# Patient Record
Sex: Female | Born: 1945 | Race: White | Hispanic: No | Smoking: Former smoker
Health system: Southern US, Community
[De-identification: ages and names within clinical notes are randomized; demographics above are authoritative.]

## PROBLEM LIST (undated history)

## (undated) DIAGNOSIS — I10 Essential (primary) hypertension: Secondary | ICD-10-CM

## (undated) DIAGNOSIS — M1990 Unspecified osteoarthritis, unspecified site: Secondary | ICD-10-CM

## (undated) DIAGNOSIS — E119 Type 2 diabetes mellitus without complications: Secondary | ICD-10-CM

## (undated) DIAGNOSIS — J4520 Mild intermittent asthma, uncomplicated: Secondary | ICD-10-CM

## (undated) DIAGNOSIS — Z860100 Personal history of colon polyps, unspecified: Secondary | ICD-10-CM

## (undated) DIAGNOSIS — E785 Hyperlipidemia, unspecified: Secondary | ICD-10-CM

## (undated) DIAGNOSIS — I6521 Occlusion and stenosis of right carotid artery: Secondary | ICD-10-CM

## (undated) DIAGNOSIS — I340 Nonrheumatic mitral (valve) insufficiency: Secondary | ICD-10-CM

## (undated) DIAGNOSIS — E113299 Type 2 diabetes mellitus with mild nonproliferative diabetic retinopathy without macular edema, unspecified eye: Secondary | ICD-10-CM

## (undated) HISTORY — DX: Personal history of colon polyps, unspecified: Z86.0100

## (undated) HISTORY — DX: Hyperlipidemia, unspecified: E78.5

## (undated) HISTORY — DX: Mild intermittent asthma, uncomplicated: J45.20

## (undated) HISTORY — DX: Type 2 diabetes mellitus with mild nonproliferative diabetic retinopathy without macular edema, unspecified eye: E11.3299

## (undated) HISTORY — DX: Unspecified osteoarthritis, unspecified site: M19.90

## (undated) HISTORY — PX: CHOLECYSTECTOMY: SHX55

## (undated) HISTORY — DX: Essential (primary) hypertension: I10

## (undated) HISTORY — DX: Nonrheumatic mitral (valve) insufficiency: I34.0

## (undated) HISTORY — PX: LASIK: SHX215

## (undated) HISTORY — DX: Type 2 diabetes mellitus without complications: E11.9

## (undated) HISTORY — DX: Personal history of colonic polyps: Z86.010

---
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
## 1970-05-11 HISTORY — PX: ABDOMINAL HYSTERECTOMY: SHX81

## 2004-06-23 ENCOUNTER — Ambulatory Visit: Payer: Self-pay | Admitting: Internal Medicine

## 2005-06-29 ENCOUNTER — Ambulatory Visit: Payer: Self-pay | Admitting: Internal Medicine

## 2005-07-23 ENCOUNTER — Ambulatory Visit: Payer: Self-pay | Admitting: Internal Medicine

## 2005-12-31 ENCOUNTER — Ambulatory Visit: Payer: Self-pay | Admitting: Internal Medicine

## 2006-05-18 ENCOUNTER — Encounter: Payer: Self-pay | Admitting: Internal Medicine

## 2007-06-03 ENCOUNTER — Emergency Department: Payer: Self-pay | Admitting: Emergency Medicine

## 2007-12-28 ENCOUNTER — Ambulatory Visit: Payer: Self-pay | Admitting: Internal Medicine

## 2007-12-28 DIAGNOSIS — I10 Essential (primary) hypertension: Secondary | ICD-10-CM | POA: Insufficient documentation

## 2007-12-28 DIAGNOSIS — M1990 Unspecified osteoarthritis, unspecified site: Secondary | ICD-10-CM | POA: Insufficient documentation

## 2007-12-28 DIAGNOSIS — J4520 Mild intermittent asthma, uncomplicated: Secondary | ICD-10-CM

## 2007-12-28 DIAGNOSIS — Z860100 Personal history of colon polyps, unspecified: Secondary | ICD-10-CM | POA: Insufficient documentation

## 2007-12-28 DIAGNOSIS — E785 Hyperlipidemia, unspecified: Secondary | ICD-10-CM | POA: Insufficient documentation

## 2007-12-28 DIAGNOSIS — J309 Allergic rhinitis, unspecified: Secondary | ICD-10-CM | POA: Insufficient documentation

## 2007-12-28 HISTORY — DX: Mild intermittent asthma, uncomplicated: J45.20

## 2007-12-30 LAB — CONVERTED CEMR LAB
Basophils Absolute: 0.1 10*3/uL (ref 0.0–0.1)
Basophils Relative: 0.6 % (ref 0.0–3.0)
CO2: 26 meq/L (ref 19–32)
Chloride: 103 meq/L (ref 96–112)
Creatinine,U: 111.6 mg/dL
GFR calc Af Amer: 110 mL/min
GFR calc non Af Amer: 91 mL/min
Lymphocytes Relative: 24.4 % (ref 12.0–46.0)
MCHC: 34.6 g/dL (ref 30.0–36.0)
Microalb Creat Ratio: 6.3 mg/g (ref 0.0–30.0)
Microalb, Ur: 0.7 mg/dL (ref 0.0–1.9)
Neutrophils Relative %: 68.4 % (ref 43.0–77.0)
Potassium: 4.4 meq/L (ref 3.5–5.1)
RBC: 5 M/uL (ref 3.87–5.11)
RDW: 12.8 % (ref 11.5–14.6)
Sodium: 138 meq/L (ref 135–145)

## 2008-01-10 ENCOUNTER — Ambulatory Visit: Payer: Self-pay | Admitting: Family Medicine

## 2008-01-10 ENCOUNTER — Telehealth (INDEPENDENT_AMBULATORY_CARE_PROVIDER_SITE_OTHER): Payer: Self-pay | Admitting: Internal Medicine

## 2008-01-12 ENCOUNTER — Telehealth (INDEPENDENT_AMBULATORY_CARE_PROVIDER_SITE_OTHER): Payer: Self-pay | Admitting: Internal Medicine

## 2008-01-13 ENCOUNTER — Telehealth (INDEPENDENT_AMBULATORY_CARE_PROVIDER_SITE_OTHER): Payer: Self-pay | Admitting: Internal Medicine

## 2008-02-29 ENCOUNTER — Ambulatory Visit: Payer: Self-pay | Admitting: Internal Medicine

## 2008-03-02 LAB — CONVERTED CEMR LAB
Alkaline Phosphatase: 81 units/L (ref 39–117)
BUN: 16 mg/dL (ref 6–23)
Bilirubin, Direct: 0.1 mg/dL (ref 0.0–0.3)
CO2: 31 meq/L (ref 19–32)
Chloride: 101 meq/L (ref 96–112)
Creatinine, Ser: 0.8 mg/dL (ref 0.4–1.2)
Direct LDL: 142.9 mg/dL
GFR calc non Af Amer: 78 mL/min
Potassium: 4.7 meq/L (ref 3.5–5.1)
Total CHOL/HDL Ratio: 6.6
VLDL: 28 mg/dL (ref 0–40)

## 2008-03-19 ENCOUNTER — Encounter (INDEPENDENT_AMBULATORY_CARE_PROVIDER_SITE_OTHER): Payer: Self-pay | Admitting: *Deleted

## 2008-04-10 ENCOUNTER — Telehealth: Payer: Self-pay | Admitting: Internal Medicine

## 2008-04-16 ENCOUNTER — Ambulatory Visit: Payer: Self-pay | Admitting: Internal Medicine

## 2008-04-17 LAB — CONVERTED CEMR LAB
ALT: 44 units/L — ABNORMAL HIGH (ref 0–35)
Albumin: 3.9 g/dL (ref 3.5–5.2)
Alkaline Phosphatase: 66 units/L (ref 39–117)
BUN: 20 mg/dL (ref 6–23)
Calcium: 9.4 mg/dL (ref 8.4–10.5)
Chloride: 102 meq/L (ref 96–112)
Cholesterol: 153 mg/dL (ref 0–200)
Creatinine, Ser: 0.8 mg/dL (ref 0.4–1.2)
GFR calc Af Amer: 94 mL/min
GFR calc non Af Amer: 78 mL/min
LDL Cholesterol: 96 mg/dL (ref 0–99)
Phosphorus: 3.8 mg/dL (ref 2.3–4.6)
Total Protein: 7.4 g/dL (ref 6.0–8.3)
Triglycerides: 99 mg/dL (ref 0–149)
VLDL: 20 mg/dL (ref 0–40)

## 2008-08-21 ENCOUNTER — Telehealth: Payer: Self-pay | Admitting: Internal Medicine

## 2008-08-29 ENCOUNTER — Ambulatory Visit: Payer: Self-pay | Admitting: Internal Medicine

## 2008-11-13 ENCOUNTER — Telehealth: Payer: Self-pay | Admitting: Internal Medicine

## 2009-01-07 ENCOUNTER — Ambulatory Visit: Payer: Self-pay | Admitting: Internal Medicine

## 2009-01-16 ENCOUNTER — Telehealth: Payer: Self-pay | Admitting: Internal Medicine

## 2009-01-25 ENCOUNTER — Ambulatory Visit: Payer: Self-pay | Admitting: Internal Medicine

## 2009-03-04 ENCOUNTER — Ambulatory Visit: Payer: Self-pay | Admitting: Internal Medicine

## 2009-03-06 LAB — CONVERTED CEMR LAB
Albumin: 4.2 g/dL (ref 3.5–5.2)
Basophils Relative: 0.3 % (ref 0.0–3.0)
CO2: 29 meq/L (ref 19–32)
Chloride: 101 meq/L (ref 96–112)
Cholesterol: 162 mg/dL (ref 0–200)
Creatinine,U: 113.6 mg/dL
Eosinophils Relative: 2 % (ref 0.0–5.0)
GFR calc non Af Amer: 67.48 mL/min (ref >60)
HCT: 45.9 % (ref 36.0–46.0)
HDL: 38.5 mg/dL — ABNORMAL LOW (ref >39.00)
LDL Cholesterol: 84 mg/dL (ref 0–99)
Lymphs Abs: 2.4 10*3/uL (ref 0.7–4.0)
MCV: 92.2 fL (ref 78.0–100.0)
Monocytes Absolute: 0.5 10*3/uL (ref 0.1–1.0)
Monocytes Relative: 5.4 % (ref 3.0–12.0)
Platelets: 271 10*3/uL (ref 150.0–400.0)
Potassium: 4.5 meq/L (ref 3.5–5.1)
RBC: 4.98 M/uL (ref 3.87–5.11)
Sodium: 140 meq/L (ref 135–145)
TSH: 1.19 microintl units/mL (ref 0.35–5.50)
Total CHOL/HDL Ratio: 4
Total Protein: 7.4 g/dL (ref 6.0–8.3)
Triglycerides: 197 mg/dL — ABNORMAL HIGH (ref 0.0–149.0)
VLDL: 39.4 mg/dL (ref 0.0–40.0)
WBC: 9.3 10*3/uL (ref 4.5–10.5)

## 2009-03-21 ENCOUNTER — Telehealth: Payer: Self-pay | Admitting: Internal Medicine

## 2009-05-22 ENCOUNTER — Telehealth: Payer: Self-pay | Admitting: Internal Medicine

## 2009-06-18 LAB — HM DIABETES EYE EXAM

## 2009-07-26 ENCOUNTER — Telehealth: Payer: Self-pay | Admitting: Internal Medicine

## 2009-08-14 ENCOUNTER — Telehealth (INDEPENDENT_AMBULATORY_CARE_PROVIDER_SITE_OTHER): Payer: Self-pay | Admitting: *Deleted

## 2009-09-02 ENCOUNTER — Ambulatory Visit: Payer: Self-pay | Admitting: Internal Medicine

## 2009-09-03 LAB — CONVERTED CEMR LAB
AST: 38 units/L — ABNORMAL HIGH (ref 0–37)
Albumin: 3.8 g/dL (ref 3.5–5.2)
Alkaline Phosphatase: 91 units/L (ref 39–117)
Bilirubin, Direct: 0.1 mg/dL (ref 0.0–0.3)
Hgb A1c MFr Bld: 10.6 % — ABNORMAL HIGH (ref 4.6–6.5)
Total Protein: 6.9 g/dL (ref 6.0–8.3)

## 2009-09-04 ENCOUNTER — Ambulatory Visit: Payer: Self-pay | Admitting: Internal Medicine

## 2009-09-04 ENCOUNTER — Encounter: Payer: Self-pay | Admitting: Internal Medicine

## 2009-09-05 ENCOUNTER — Encounter: Payer: Self-pay | Admitting: Internal Medicine

## 2009-09-05 LAB — HM MAMMOGRAPHY: HM Mammogram: NORMAL

## 2009-11-18 ENCOUNTER — Telehealth: Payer: Self-pay | Admitting: Internal Medicine

## 2010-01-27 ENCOUNTER — Telehealth: Payer: Self-pay | Admitting: Internal Medicine

## 2010-03-03 ENCOUNTER — Ambulatory Visit: Payer: Self-pay | Admitting: Internal Medicine

## 2010-03-03 LAB — HM DIABETES FOOT EXAM

## 2010-03-05 LAB — CONVERTED CEMR LAB
Alkaline Phosphatase: 78 units/L (ref 39–117)
BUN: 16 mg/dL (ref 6–23)
Basophils Absolute: 0.1 10*3/uL (ref 0.0–0.1)
Bilirubin, Direct: 0.1 mg/dL (ref 0.0–0.3)
Calcium: 10.1 mg/dL (ref 8.4–10.5)
Chloride: 99 meq/L (ref 96–112)
Creatinine, Ser: 0.7 mg/dL (ref 0.4–1.2)
Creatinine,U: 34.3 mg/dL
Eosinophils Absolute: 0.2 10*3/uL (ref 0.0–0.7)
HCT: 45.1 % (ref 36.0–46.0)
HDL: 38.2 mg/dL — ABNORMAL LOW (ref >39.00)
Hemoglobin: 15.6 g/dL — ABNORMAL HIGH (ref 12.0–15.0)
Lymphs Abs: 2.4 10*3/uL (ref 0.7–4.0)
MCHC: 34.7 g/dL (ref 30.0–36.0)
MCV: 91.2 fL (ref 78.0–100.0)
Monocytes Absolute: 0.5 10*3/uL (ref 0.1–1.0)
Monocytes Relative: 5.4 % (ref 3.0–12.0)
Neutro Abs: 6.2 10*3/uL (ref 1.4–7.7)
Platelets: 272 10*3/uL (ref 150.0–400.0)
Potassium: 4.9 meq/L (ref 3.5–5.1)
RDW: 13.1 % (ref 11.5–14.6)
Total Bilirubin: 0.4 mg/dL (ref 0.3–1.2)
VLDL: 44 mg/dL — ABNORMAL HIGH (ref 0.0–40.0)

## 2010-04-07 ENCOUNTER — Telehealth: Payer: Self-pay | Admitting: Internal Medicine

## 2010-04-11 ENCOUNTER — Ambulatory Visit: Payer: Self-pay | Admitting: Internal Medicine

## 2010-04-11 DIAGNOSIS — M549 Dorsalgia, unspecified: Secondary | ICD-10-CM | POA: Insufficient documentation

## 2010-04-11 DIAGNOSIS — N760 Acute vaginitis: Secondary | ICD-10-CM | POA: Insufficient documentation

## 2010-05-27 ENCOUNTER — Ambulatory Visit
Admission: RE | Admit: 2010-05-27 | Discharge: 2010-05-27 | Payer: Self-pay | Source: Home / Self Care | Attending: Internal Medicine | Admitting: Internal Medicine

## 2010-05-27 LAB — CONVERTED CEMR LAB: Whiff Test: NEGATIVE

## 2010-06-10 NOTE — Assessment & Plan Note (Signed)
Summary: FOLLOW UP / LFW   Vital Signs:  Patient profile:   65 year old female Weight:      211 pounds BMI:     35.24 Temp:     98.1 degrees F oral BP sitting:   148 / 80  (left arm) Cuff size:   large  Vitals Entered By: Mervin Hack CMA [facility]) (September 02, 2009 10:20 AM) CC: 6 month follow-up   History of Present Illness: doing okay Mammo order not taken care of properly Copy given to her Wants to set up colonoscopy   Checks sugars 1-2 times a day does adjust insulin sometimes Fasting generally 120-150 No sig hypoglycemic reactions No sores or pain in feet  Does note cramping in legs and hands not bothersome She relates it to a lot of time on the computer Foot was after sig gardening time  Mild allergy symptoms uses OTC Asthma reasonably controlled uses rescue inhaler rarely  Allergies: 1)  ! Codeine  Past History:  Past medical, surgical, family and social histories (including risk factors) reviewed for relevance to current acute and chronic problems.  Past Medical History: Reviewed history from 12/28/2007 and no changes required. Allergic rhinitis Asthma Colonic polyps, hx of Diabetes mellitus, type II Hyperlipidemia Hypertension Osteoarthritis  Past Surgical History: Reviewed history from 12/28/2007 and no changes required. Cholecystectomy Hysterectomy LASIK  Family History: Reviewed history from 12/28/2007 and no changes required. Diabetes in both parents and 4 of 5 sibs Dad died of liver problems, brittle diabetes, asthma @78  Mom died @50  DM complications CAD in brother, mat GF Sister with carotid disease No HTN Mat aunt had breast cancer  Social History: Reviewed history from 01/07/2009 and no changes required. 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 Tries to initiate after son is gone for the day weight is up 7#--not walking lately  Physical Exam  General:  alert and normal appearance.   Neck:  supple, no masses, no thyromegaly, no carotid bruits, and no cervical lymphadenopathy.   Lungs:  normal respiratory effort and normal breath sounds.   Heart:  normal rate, regular rhythm, no murmur, and no gallop.   Abdomen:  soft and non-tender.   Msk:  no joint tenderness and no joint swelling.   Pulses:  1+ in feet Extremities:  no edema Neurologic:  alert & oriented X3, strength normal in all extremities, and gait normal.   Skin:  no suspicious lesions and no ulcerations.   Psych:  normally interactive, good eye contact, not anxious appearing, and not depressed appearing.    Diabetes Management Exam:    Foot Exam (with socks and/or shoes not present):       Sensory-Pinprick/Light touch:          Left medial foot (L-4): normal          Left dorsal foot (L-5): normal          Left lateral foot (S-1): normal          Right medial foot (L-4): normal          Right dorsal foot (L-5): normal          Right lateral foot (S-1): normal       Inspection:  Left foot: abnormal             Comments: mild plantar callous          Right foot: abnormal             Comments: mild plantar callous       Nails:          Left foot: normal          Right foot: normal    Eye Exam:       Eye Exam done elsewhere          Date: 06/18/2009          Results: No retinopathy          Done by: [facility]    Impression & Recommendations:  Problem # 1:  DIABETES MELLITUS, TYPE II (ICD-250.00) Assessment Unchanged  seems to have reasonable control will recheck labs  Her updated medication list for this problem includes:    Novolin 70/30 Innolet 70-30 % Susp (Insulin isophane & regular) .Marland Kitchen... 20 - 25 units two times a day    Lisinopril 20 Mg Tabs (Lisinopril) .Marland Kitchen... Take 1 tablet by mouth once  a day    Metformin Hcl 500 Mg Xr24h-tab (Metformin hcl) .Marland Kitchen... 1 tablet twice a day by mouth    Novolin R 100 Unit/ml Inj Soln (Insulin regular human) .Marland Kitchen... 2-10 units as needed bid by sliding scale  Labs Reviewed: Creat: 0.9 (03/04/2009)     Last Eye Exam: No retinopathy (06/18/2009) Reviewed HgBA1c results: 7.9 (03/04/2009)  7.3 (08/29/2008)  Orders: TLB-A1C / Hgb A1C (Glycohemoglobin) (83036-A1C) Venipuncture (16109)  Problem # 2:  HYPERTENSION (ICD-401.9) Assessment: Unchanged reasonable control no changes now since no microalbuminuria  Her updated medication list for this problem includes:    Lisinopril 20 Mg Tabs (Lisinopril) .Marland Kitchen... Take 1 tablet by mouth once a day  BP today: 148/80 Prior BP: 130/80 (03/04/2009)  Labs Reviewed: K+: 4.5 (03/04/2009) Creat: : 0.9 (03/04/2009)   Chol: 162 (03/04/2009)   HDL: 38.50 (03/04/2009)   LDL: 84 (03/04/2009)   TG: 197.0 (03/04/2009)  Problem # 3:  HYPERLIPIDEMIA (ICD-272.4) Assessment: Unchanged  at goal will recheck LFTs since borderline transaminase last time  Her updated medication list for this problem includes:    Lovastatin 40 Mg Tabs (Lovastatin) .Marland Kitchen... 1 daily by mouth  Labs Reviewed: SGOT: 30 (03/04/2009)   SGPT: 43 (03/04/2009)   HDL:38.50 (03/04/2009), 37.0 (04/16/2008)  LDL:84 (03/04/2009), 96 (04/16/2008)  Chol:162 (03/04/2009), 153 (04/16/2008)  Trig:197.0 (03/04/2009), 99 (04/16/2008)  Orders: TLB-Hepatic/Liver Function Pnl (80076-HEPATIC)  Problem # 4:  ASTHMA (ICD-493.90) Assessment: Unchanged quiet rarely needs rescue inhaler  Her updated medication list for this problem includes:    Advair Diskus 250-50 Mcg/dose Misc (Fluticasone-salmeterol) .Marland Kitchen... 1 puff two times a day    Ventolin Hfa 108 (90 Base) Mcg/act Aers (Albuterol sulfate) .Marland Kitchen... 2 puffs four times daily as needed for asthma  Complete Medication List: 1)  Novolin 70/30 Innolet 70-30 % Susp (Insulin isophane & regular) .... 20 - 25 units two  times a day 2)  Advair Diskus 250-50 Mcg/dose Misc (Fluticasone-salmeterol) .Marland Kitchen.. 1 puff two times a day 3)  Ventolin Hfa 108 (90 Base) Mcg/act Aers (Albuterol sulfate) .... 2 puffs four times daily as needed for asthma 4)  Lisinopril 20 Mg Tabs (Lisinopril) .... Take 1 tablet by mouth once a day 5)  Alprazolam 0.5 Mg Tabs (Alprazolam) .... Take 1-2 tablet by mouth at bedtime as needed 6)  Metformin Hcl  500 Mg Xr24h-tab (Metformin hcl) .Marland Kitchen.. 1 tablet twice a day by mouth 7)  Novolin R 100 Unit/ml Inj Soln (Insulin regular human) .... 2-10 units as needed bid by sliding scale 8)  Lovastatin 40 Mg Tabs (Lovastatin) .Marland Kitchen.. 1 daily by mouth 9)  Accu-chek Aviva Strp (Glucose blood) .... Check  blood glucose two times a day 10)  Easy Comfort Insulin Syringe 30g X 5/16" 0.5 Ml Misc (Insulin syringe-needle u-100) .... Inject 20 - 25 units two times a day sliding scale  Other Orders: Gastroenterology Referral (GI)  Patient Instructions: 1)  Please schedule a follow-up appointment in 6 months for physical 2)  Schedule a colonoscopy/ sigmoidoscopy to help detect colon cancer.  Prescriptions: ALPRAZOLAM 0.5 MG  TABS (ALPRAZOLAM) Take 1-2 tablet by mouth at bedtime as needed  #60 x 1   Entered and Authorized by:   Cindee Salt MD   Signed by:   Cindee Salt MD on 09/02/2009   Method used:   Print then Give to Patient   RxID:   [id number] LOVASTATIN 40 MG TABS (LOVASTATIN) 1 DAILY BY MOUTH  #30 x 12   Entered by:   Mervin Hack CMA (AAMA)   Authorized by:   Cindee Salt MD   Signed by:   Mervin Hack CMA (AAMA) on 09/02/2009   Method used:   Print then Give to Patient   RxID:   [id number] METFORMIN HCL 500 MG XR24H-TAB (METFORMIN HCL) 1 TABLET TWICE A DAY BY MOUTH  #60 x 12   Entered by:   Mervin Hack CMA (AAMA)   Authorized by:   Cindee Salt MD   Signed by:   Mervin Hack CMA (AAMA) on 09/02/2009   Method used:   Print then Give to Patient   RxID:    [id number] NOVOLIN R 100 UNIT/ML INJ SOLN (INSULIN REGULAR HUMAN) 2-10 units as needed bid by sliding scale  #1 vial x 12   Entered by:   Mervin Hack CMA (AAMA)   Authorized by:   Cindee Salt MD   Signed by:   Mervin Hack CMA (AAMA) on 09/02/2009   Method used:   Print then Give to Patient   RxID:   [id number] LISINOPRIL 20 MG  TABS (LISINOPRIL) Take 1 tablet by mouth once a day  #30 x 12   Entered by:   Mervin Hack CMA (AAMA)   Authorized by:   Cindee Salt MD   Signed by:   Mervin Hack CMA (AAMA) on 09/02/2009   Method used:   Print then Give to Patient   RxID:   [id number] ADVAIR DISKUS 250-50 MCG/DOSE  MISC (FLUTICASONE-SALMETEROL) 1 puff two times a day  #1 x 12   Entered by:   Mervin Hack CMA (AAMA)   Authorized by:   Cindee Salt MD   Signed by:   Mervin Hack CMA (AAMA) on 09/02/2009   Method used:   Print then Give to Patient   RxID:   [id number] NOVOLIN 70/30 INNOLET 70-30 %  SUSP (INSULIN ISOPHANE & REGULAR) 20 - 25 units two times a day  #20.0 Millili x 12   Entered by:   Mervin Hack CMA (AAMA)   Authorized by:   Cindee Salt MD   Signed by:   Mervin Hack CMA (AAMA) on 09/02/2009   Method used:   Print then Give to Patient   RxID:   [id number]   Current Allergies (reviewed today): ! CODEINE

## 2010-06-10 NOTE — Assessment & Plan Note (Signed)
Summary: BACK PAIN/CLE   Vital Signs:  Patient profile:   65 year old female Height:      65 inches Weight:      219.75 pounds BMI:     36.70 Temp:     98.6 degrees F oral Pulse rate:   84 / minute Pulse rhythm:   regular BP sitting:   150 / 100  (left arm) Cuff size:   large  Vitals Entered By: Linde Gillis CMA [facility]) (April 11, 2010 8:03 AM) CC: back pain, arm pain   History of Present Illness: "someything is wrong with my back" Has pain in lateral left lumbar back tingling down to feet and up into arms at times Intermittent grabbing pain  pain in left neck as well Has felt leg weakness a couple of times started 11 days ago no known injury Has tried aspirin and ibuprofen as well as aspercreme--not effective Is affecting her sleep  Has vaginal infection for 1 week No recent antibiotic No significant discharge--slight yellowy Itchy and very raw No sexual contacts Miconazole topical (OTC) hasn't helped  Having hives benedryl helps but is sedating  Allergies: 1)  ! Codeine  Past History:  Past medical, surgical, family and social histories (including risk factors) reviewed for relevance to current acute and chronic problems.  Past Medical History: Reviewed history from 12/28/2007 and no changes required. Allergic rhinitis Asthma Colonic polyps, hx of Diabetes mellitus, type II Hyperlipidemia Hypertension Osteoarthritis  Past Surgical History: Reviewed history from 12/28/2007 and no changes required. Cholecystectomy Hysterectomy LASIK  Family History: Reviewed history from 12/28/2007 and no changes required. Diabetes in both parents and 4 of 5 sibs Dad died of liver problems, brittle diabetes, asthma @78  Mom died @50  DM complications CAD in brother, mat GF Sister with carotid disease No HTN Mat aunt had breast cancer  Social History: Reviewed history from 01/07/2009 and no changes required. 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   Abdomen:  soft and non-tender.   Msk:  Mild decreased internal rotation in left hip but not right Fairly normal back flexion No spine tenderness Mild lateral left lumbar tenderness--in muscle area SLR negative Neurologic:  strength normal in all extremities and gait normal.     Impression & Recommendations:  Problem # 1:  BACK PAIN (ICD-724.5) Assessment New  seems to be muscular May have component of left hip osteoarthritis no evidence of HNP  will continue NSAIDs flexeril for sleep PT referral if not better in a couple of weeks  Her updated medication list for this problem includes:    Cyclobenzaprine Hcl 10 Mg Tabs (Cyclobenzaprine hcl) .Marland Kitchen... 1/2 -1 tab at bedtime as needed for muscle spasm  Problem # 2:  VAGINITIS (ICD-616.10) Assessment: New  most likely bacterial vaginosis Will try metrogel vaginal needs exam if not resolved  Her updated medication list for this problem includes:    Metronidazole 0.75 % Gel (Metronidazole) .Marland Kitchen... 1 applicatorful in vaginal two times a day for 5 days  Complete Medication List: 1)  Novolin 70/30 Innolet 70-30 % Susp (Insulin isophane & regular) .... 35-40 units two times a day 2)  Advair Diskus 250-50 Mcg/dose Misc (Fluticasone-salmeterol) .Marland Kitchen.. 1 puff two times a day 3)  Ventolin Hfa 108 (90 Base) Mcg/act Aers (Albuterol sulfate) .... 2 puffs four times daily  as needed for asthma 4)  Lisinopril 20 Mg Tabs (Lisinopril) .... Take 1 tablet by mouth once a day 5)  Alprazolam 0.5 Mg Tabs (Alprazolam) .... Take 1-2 tablet by mouth at bedtime as needed 6)  Metformin Hcl 500 Mg Xr24h-tab (Metformin hcl) .... 2 tablets daily before morning meal 7)  Novolin R 100 Unit/ml Inj Soln (Insulin regular human) .... 2-10 units as needed before meals  by sliding scale 8)   Lovastatin 40 Mg Tabs (Lovastatin) .Marland Kitchen.. 1 daily by mouth 9)  Accu-chek Aviva Strp (Glucose blood) .... Check  blood glucose two times a day 10)  Easy Comfort Insulin Syringe 30g X 5/16" 0.5 Ml Misc (Insulin syringe-needle u-100) .... Inject 20 - 25 units two times a day sliding scale 11)  Cyclobenzaprine Hcl 10 Mg Tabs (Cyclobenzaprine hcl) .... 1/2 -1 tab at bedtime as needed for muscle spasm 12)  Metronidazole 0.75 % Gel (Metronidazole) .Marland Kitchen.. 1 applicatorful in vaginal two times a day for 5 days  Patient Instructions: 1)  Please call if back isn't better in 2 weeks 2)  Please try ibuprofen 400-600mg  three times a day for the back till better 3)  Please try cetirizine 10mg  daily and/or loratadine 10mg  1-2 daily for the hives 4)  Keep regular follow up Prescriptions: METRONIDAZOLE 0.75 % GEL (METRONIDAZOLE) 1 applicatorful in vaginal two times a day for 5 days  #5 days x 0   Entered and Authorized by:   Cindee Salt MD   Signed by:   Cindee Salt MD on 04/11/2010   Method used:   Print then Give to Patient   RxID:   [id number] CYCLOBENZAPRINE HCL 10 MG TABS (CYCLOBENZAPRINE HCL) 1/2 -1 tab at bedtime as needed for muscle spasm  #30 x 0   Entered and Authorized by:   Cindee Salt MD   Signed by:   Cindee Salt MD on 04/11/2010   Method used:   Print then Give to Patient   RxID:   [id number]    Orders Added: 1)  Est. Patient Level IV [10272]    Current Allergies (reviewed today): ! CODEINE

## 2010-06-10 NOTE — Progress Notes (Signed)
Summary: ALPRAZOLAM  Phone Note Refill Request Message from:  [facility] [phone] on January 27, 2010 10:04 AM  Refills Requested: Medication #1:  ALPRAZOLAM 0.5 MG  TABS Take 1-2 tablet by mouth at bedtime as needed   Last Refilled: 12/26/2009 E-Scribe Request    Method Requested: Telephone to Pharmacy Initial call taken by: Mervin Hack CMA [facility]),  January 27, 2010 10:04 AM  Follow-up for Phone Call        okay #60 x 1 Follow-up by: Cindee Salt MD,  January 27, 2010 1:47 PM  Additional Follow-up for Phone Call Additional follow up Details #1::        Rx called to pharmacy Additional Follow-up by: DeShannon Katrinka Blazing CMA [facility]ull),  January 27, 2010 2:47 PM    Prescriptions: ALPRAZOLAM 0.5 MG  TABS (ALPRAZOLAM) Take 1-2 tablet by mouth at bedtime as needed  #60 x 1   Entered by:   Mervin Hack CMA (AAMA)   Authorized by:   Cindee Salt MD   Signed by:   Mervin Hack CMA (AAMA) on 01/27/2010   Method used:   Telephoned to ...       [facility] [address] [phone]* (retail)       [address], [place]  [address]       Ph: [phone]       Fax: [phone]   RxID:   [id number]

## 2010-06-10 NOTE — Assessment & Plan Note (Signed)
Summary: CPX / LFW   Vital Signs:  Patient profile:   65 year old female Weight:      217 pounds Temp:     98.1 degrees F oral Pulse rate:   84 / minute Pulse rhythm:   regular BP sitting:   156 / 86  (left arm) Cuff size:   large  Vitals Entered By: Sydell Axon LPN (March 03, 2010 11:23 AM) CC: 30 minute checkup, has had a hysterectomy   History of Present Illness: Doing okay  Did have an accident at work about 3 weeks ago---hit trash can and bounced off door Having some left shoulder pain still  Has had PT Missed only 2 dayas of work, then went back  Did increase insulin unwilling to take more oral meds Scattered compliance with diet---does avoid fried food walks some  did have follow up with GI  Colonscopy deferred till insurance will cover she thinks all her polyps were just hyperplastic  Allergies: 1)  ! Codeine  Past History:  Past medical, surgical, family and social histories (including risk factors) reviewed for relevance to current acute and chronic problems.  Past Medical History: Reviewed history from 12/28/2007 and no changes required. Allergic rhinitis Asthma Colonic polyps, hx of Diabetes mellitus, type II Hyperlipidemia Hypertension Osteoarthritis  Past Surgical History: Reviewed history from 12/28/2007 and no changes required. Cholecystectomy Hysterectomy LASIK  Family History: Reviewed history from 12/28/2007 and no changes required. Diabetes in both parents and 4 of 5 sibs Dad died of liver problems, brittle diabetes, asthma @78  Mom died @50  DM complications CAD in brother, mat GF Sister with carotid disease No HTN Mat aunt had breast cancer  Social History: Reviewed history from 01/07/2009 and no changes required. 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 Eyes:  Denies double vision and vision loss-1 eye. ENT:  Denies decreased hearing and ringing in ears; teeth okay--regular with dentist. CV:  Complains of palpitations and shortness of breath with exertion; denies chest pain or discomfort, difficulty breathing at night, difficulty breathing while lying down, fainting, and lightheadness; some palps with exertion---feels fast not skips Stable DOE when she pushes herself. Resp:  Denies cough and shortness of breath. GI:  Complains of indigestion; denies abdominal pain, bloody stools, change in bowel habits, dark tarry stools, nausea, and vomiting; occ heartburn--okay if she avoids spicy food. GU:  Denies dysuria and incontinence. MS:  Complains of joint pain; denies joint swelling; occ hand pain--relates to her crocheting No AM stiffness. Derm:  Denies lesion(s) and rash. Neuro:  Denies headaches, numbness, tingling, and weakness. Psych:  Denies anxiety and depression. Endo:  Denies cold intolerance and heat intolerance. Heme:  Denies abnormal bruising and enlarge lymph nodes. Allergy:  Complains of hives or rash, seasonal allergies, and sneezing; prefers no meds Gets intermittent hives--uses benedryl for these.  Physical Exam  General:  alert and normal appearance.   Eyes:  pupils equal, pupils round, and pupils reactive to light.  Early cataracts bilat fundus normal on left, limited view on right Ears:  R ear normal and L ear normal.   Mouth:  no erythema, no exudates, and no lesions.   Neck:  supple, no masses, no thyromegaly, no carotid bruits, and no cervical lymphadenopathy.   Breasts:  no masses, no abnormal thickening, no tenderness, and no adenopathy.   Lungs:  normal respiratory effort, no intercostal retractions, no accessory muscle use, and normal breath sounds.   Heart:  normal rate, regular rhythm, no murmur, and no gallop.   Abdomen:  soft, non-tender, and no masses.     Msk:  no joint tenderness and no joint swelling.   Some decreased ROM in left shoulder Pulses:  1+ in feet Extremities:  no edema Neurologic:  alert & oriented X3, strength normal in all extremities, and gait normal.   Skin:  no suspicious lesions and no ulcerations.   Psych:  normally interactive, good eye contact, not anxious appearing, and not depressed appearing.    Diabetes Management Exam:    Foot Exam (with socks and/or shoes not present):       Sensory-Pinprick/Light touch:          Left medial foot (L-4): normal          Left dorsal foot (L-5): normal          Left lateral foot (S-1): normal          Right medial foot (L-4): normal          Right dorsal foot (L-5): normal          Right lateral foot (S-1): normal       Inspection:          Left foot: normal          Right foot: normal       Nails:          Left foot: normal          Right foot: normal   Impression & Recommendations:  Problem # 1:  PREVENTIVE HEALTH CARE (ICD-V70.0) Assessment Comment Only had mammo if no adenomatous polyps, colon due in 2014 discussed fitness  Problem # 2:  DIABETES MELLITUS, TYPE II (ICD-250.00) Assessment: Improved  hopefully better on higher dose insulin really not doing her best with lifestyle after discussion, she will increase the metformin to 2 daily  Her updated medication list for this problem includes:    Novolin 70/30 Innolet 70-30 % Susp (Insulin isophane & regular) .Marland KitchenMarland KitchenMarland KitchenMarland Kitchen 35-40 units two times a day    Lisinopril 20 Mg Tabs (Lisinopril) .Marland Kitchen... Take 1 tablet by mouth once a day    Metformin Hcl 500 Mg Xr24h-tab (Metformin hcl) .Marland Kitchen... 2 tablets daily before morning meal    Novolin R 100 Unit/ml Inj Soln (Insulin regular human) .Marland Kitchen... 2-10 units as needed before meals  by sliding scale  Orders: TLB-A1C / Hgb A1C (Glycohemoglobin) (83036-A1C) TLB-Microalbumin/Creat Ratio, Urine (82043-MALB)  Problem # 3:  HYPERTENSION (ICD-401.9) Assessment: Unchanged  control  isn't great will add diuretuc if urine microal elevated  Her updated medication list for this problem includes:    Lisinopril 20 Mg Tabs (Lisinopril) .Marland Kitchen... Take 1 tablet by mouth once a day  BP today: 156/86 Prior BP: 148/80 (09/02/2009)  Labs Reviewed: K+: 4.5 (03/04/2009) Creat: : 0.9 (03/04/2009)   Chol: 162 (03/04/2009)   HDL: 38.50 (03/04/2009)   LDL: 84 (03/04/2009)   TG: 197.0 (03/04/2009)  Orders: TLB-Renal Function Panel (80069-RENAL) TLB-CBC Platelet - w/Differential (85025-CBCD) TLB-TSH (Thyroid Stimulating Hormone) (84443-TSH)  Problem # 4:  HYPERLIPIDEMIA (ICD-272.4) Assessment: Unchanged  will recheck labs she worries about her liver  Her updated medication list for this problem includes:    Lovastatin 40 Mg Tabs (Lovastatin) .Marland Kitchen... 1 daily by mouth  Labs Reviewed: SGOT: 38 (  09/02/2009)   SGPT: 63 (09/02/2009)   HDL:38.50 (03/04/2009), 37.0 (04/16/2008)  LDL:84 (03/04/2009), 96 (04/16/2008)  Chol:162 (03/04/2009), 153 (04/16/2008)  Trig:197.0 (03/04/2009), 99 (04/16/2008)  Orders: TLB-Lipid Panel (80061-LIPID) TLB-Hepatic/Liver Function Pnl (80076-HEPATIC) Venipuncture (02725)  Complete Medication List: 1)  Novolin 70/30 Innolet 70-30 % Susp (Insulin isophane & regular) .... 35-40 units two times a day 2)  Advair Diskus 250-50 Mcg/dose Misc (Fluticasone-salmeterol) .Marland Kitchen.. 1 puff two times a day 3)  Ventolin Hfa 108 (90 Base) Mcg/act Aers (Albuterol sulfate) .... 2 puffs four times daily as needed for asthma 4)  Lisinopril 20 Mg Tabs (Lisinopril) .... Take 1 tablet by mouth once a day 5)  Alprazolam 0.5 Mg Tabs (Alprazolam) .... Take 1-2 tablet by mouth at bedtime as needed 6)  Metformin Hcl 500 Mg Xr24h-tab (Metformin hcl) .... 2 tablets daily before morning meal 7)  Novolin R 100 Unit/ml Inj Soln (Insulin regular human) .... 2-10 units as needed before meals  by sliding scale 8)  Lovastatin 40 Mg Tabs (Lovastatin) .Marland Kitchen.. 1 daily by mouth 9)  Accu-chek Aviva Strp  (Glucose blood) .... Check  blood glucose two times a day 10)  Easy Comfort Insulin Syringe 30g X 5/16" 0.5 Ml Misc (Insulin syringe-needle u-100) .... Inject 20 - 25 units two times a day sliding scale  Other Orders: Admin 1st Vaccine (36644) Flu Vaccine 73yrs + (03474)  Patient Instructions: 1)  Please schedule a follow-up appointment in 6 months .  Prescriptions: NOVOLIN R 100 UNIT/ML INJ SOLN (INSULIN REGULAR HUMAN) 2-10 units as needed before meals  by sliding scale  #1000units x 3   Entered and Authorized by:   Cindee Salt MD   Signed by:   Cindee Salt MD on 03/03/2010   Method used:   Print then Give to Patient   RxID:   [id number] METFORMIN HCL 500 MG XR24H-TAB (METFORMIN HCL) 2 tablets daily before morning meal  #60 x 11   Entered and Authorized by:   Cindee Salt MD   Signed by:   Cindee Salt MD on 03/03/2010   Method used:   Print then Give to Patient   RxID:   [id number] VENTOLIN HFA 108 (90 BASE) MCG/ACT  AERS (ALBUTEROL SULFATE) 2 puffs four times daily as needed for asthma  #1 x 2   Entered and Authorized by:   Cindee Salt MD   Signed by:   Cindee Salt MD on 03/03/2010   Method used:   Print then Give to Patient   RxID:   [id number] ADVAIR DISKUS 250-50 MCG/DOSE  MISC (FLUTICASONE-SALMETEROL) 1 puff two times a day  #1 x 11   Entered and Authorized by:   Cindee Salt MD   Signed by:   Cindee Salt MD on 03/03/2010   Method used:   Print then Give to Patient   RxID:   [id number] NOVOLIN 70/30 INNOLET 70-30 %  SUSP (INSULIN ISOPHANE & REGULAR) 35-40 units two times a day  #3000units x 11   Entered and Authorized by:   Cindee Salt MD   Signed by:   Cindee Salt MD on 03/03/2010   Method used:   Print then Give to Patient   RxID:   [id number]    Orders Added: 1)  Admin 1st Vaccine [90471] 2)  Flu Vaccine 23yrs + [70350] 3)  TLB-Lipid Panel [80061-LIPID] 4)   TLB-Hepatic/Liver Function Pnl [80076-HEPATIC] 5)  Venipuncture [09381] 6)  TLB-Renal Function Panel [80069-RENAL] 7)  TLB-CBC Platelet - w/Differential [85025-CBCD] 8)  TLB-TSH (Thyroid Stimulating Hormone) [84443-TSH] 9)  TLB-A1C / Hgb A1C (Glycohemoglobin) [83036-A1C] 10)  TLB-Microalbumin/Creat Ratio, Urine [82043-MALB] 11)  Est. Patient 40-64 years [99396]   Immunization History:  Tetanus/Td Immunization History:    Tetanus/Td:  Tdap (12/28/2007)  Influenza Immunization History:    Influenza:  Fluvax 3+ (03/03/2010)  Pneumovax Immunization History:    Pneumovax:  Historical (05/11/2004)   Immunization History:  Pneumovax Immunization History:    Pneumovax:  Historical (05/11/2004)  Current Allergies (reviewed today): ! CODEINE  Flu Vaccine Consent Questions     Do you have a history of severe allergic reactions to this vaccine? no    Any prior history of allergic reactions to egg and/or gelatin? no    Do you have a sensitivity to the preservative Thimersol? no    Do you have a past history of Guillan-Barre Syndrome? no    Do you currently have an acute febrile illness? no    Have you ever had a severe reaction to latex? no    Vaccine information given and explained to patient? yes    Are you currently pregnant? no    Lot Number:[id number]   Exp Date:11/08/2010   Site Given  Right Deltoid IM .lbflu1

## 2010-06-10 NOTE — Progress Notes (Signed)
Summary:  ALPRAZOLAM  Phone Note Refill Request Message from:  [facility] [phone] on November 18, 2009 12:32 PM  Refills Requested: Medication #1:  ALPRAZOLAM 0.5 MG  TABS Take 1-2 tablet by mouth at bedtime as needed   Last Refilled: 10/19/2009 E-Scribe Request    Method Requested: Telephone to Pharmacy Initial call taken by: Mervin Hack CMA Duncan Dull),  November 18, 2009 12:32 PM  Follow-up for Phone Call        okay #60 x 1 Follow-up by: Cindee Salt MD,  November 18, 2009 2:01 PM  Additional Follow-up for Phone Call Additional follow up Details #1::        Rx called to pharmacy Additional Follow-up by: DeShannon Katrinka Blazing CMA [facility] Dull),  November 18, 2009 3:29 PM    Prescriptions: ALPRAZOLAM 0.5 MG  TABS (ALPRAZOLAM) Take 1-2 tablet by mouth at bedtime as needed  #60 x 1   Entered by:   Mervin Hack CMA (AAMA)   Authorized by:   Cindee Salt MD   Signed by:   Mervin Hack CMA (AAMA) on 11/18/2009   Method used:   Telephoned to ...       [facility] [address] [phone]* (retail)       [address], [place]  [address]       Ph: [phone]       Fax: [phone]   RxID:   [id number]

## 2010-06-10 NOTE — Progress Notes (Signed)
Summary: ALPRAZOLAM  Phone Note Refill Request Message from:  [facility] [phone] on April 07, 2010 4:19 PM  Refills Requested: Medication #1:  ALPRAZOLAM 0.5 MG  TABS Take 1-2 tablet by mouth at bedtime as needed   Last Refilled: 02/20/2010 E-Scribe Request    Method Requested: Telephone to Pharmacy Initial call taken by: Mervin Hack CMA Duncan Dull),  April 07, 2010 4:19 PM  Follow-up for Phone Call        okay #60 x 1 Follow-up by: Cindee Salt MD,  April 07, 2010 5:00 PM  Additional Follow-up for Phone Call Additional follow up Details #1::        Rx called to pharmacy Additional Follow-up by: DeShannon Smith CMA Duncan Dull),  April 08, 2010 8:03 AM    Prescriptions: ALPRAZOLAM 0.5 MG  TABS (ALPRAZOLAM) Take 1-2 tablet by mouth at bedtime as needed  #60 x 1   Entered by:   Mervin Hack CMA (AAMA)   Authorized by:   Cindee Salt MD   Signed by:   Mervin Hack CMA (AAMA) on 04/08/2010   Method used:   Telephoned to ...       [facility] [address] [phone]* (retail)       [address]       [place], [place]  [address]       Ph: [phone] or [phone]       Fax: [phone]   RxID:   [id number]

## 2010-06-10 NOTE — Progress Notes (Signed)
Summary: ALPRAZOLAM   Phone Note Refill Request Message from:  [facility] [phone] on July 26, 2009 8:25 AM  Refills Requested: Medication #1:  ALPRAZOLAM 0.5 MG  TABS Take 1-2 tablet by mouth at bedtime as needed   Last Refilled: 06/23/2009 E-Scribe Request    Method Requested: Telephone to Pharmacy Initial call taken by: Mervin Hack CMA Duncan Dull),  July 26, 2009 8:26 AM  Follow-up for Phone Call        okay #60 x 1 Follow-up by: Cindee Salt MD,  July 26, 2009 1:50 PM  Additional Follow-up for Phone Call Additional follow up Details #1::        Rx called to pharmacy Additional Follow-up by: DeShannon Katrinka Blazing CMA [facility] Dull),  July 26, 2009 3:15 PM    Prescriptions: ALPRAZOLAM 0.5 MG  TABS (ALPRAZOLAM) Take 1-2 tablet by mouth at bedtime as needed  #60 x 1   Entered by:   Mervin Hack CMA (AAMA)   Authorized by:   Cindee Salt MD   Signed by:   Mervin Hack CMA (AAMA) on 07/26/2009   Method used:   Telephoned to ...       [facility] [address] [phone]* (retail)       [address], [place]  [address]       Ph: [phone]       Fax: [phone]   RxID:   [id number]

## 2010-06-10 NOTE — Progress Notes (Signed)
Summary: ALPRAZOLAM  Phone Note Refill Request Message from:  [facility] [phone] on May 22, 2009 11:45 AM  Refills Requested: Medication #1:  ALPRAZOLAM 0.5 MG  TABS Take 1-2 tablet by mouth at bedtime as needed   Last Refilled: 04/21/2009 E-Scribe Request    Method Requested: Telephone to Pharmacy Initial call taken by: Mervin Hack CMA Duncan Dull),  May 22, 2009 11:45 AM  Follow-up for Phone Call        okay #60 x 1 Follow-up by: Cindee Salt MD,  May 22, 2009 3:33 PM  Additional Follow-up for Phone Call Additional follow up Details #1::        Rx called to pharmacy Additional Follow-up by: Linde Gillis CMA Duncan Dull),  May 22, 2009 3:56 PM

## 2010-06-10 NOTE — Progress Notes (Signed)
Summary: needs order for mammogram  Phone Note Call from Patient Call back at Home Phone [phone]   Caller: Patient Call For: Cindee Salt MD Summary of Call: Pt is asking for an order for a mammogram to be faxed to Ander Purpura at [facility]. Pt can get mammogram done there free of charge.  Fax number is [phone]. Initial call taken by: Lowella Petties CMA,  August 14, 2009 10:07 AM  Follow-up for Phone Call        No radiology at [facility] but they must set it up order done Follow-up by: Cindee Salt MD,  August 14, 2009 2:17 PM  Additional Follow-up for Phone Call Additional follow up Details #1::        Mammogram order faxed to [facility].Daine Gip  August 16, 2009 4:27 PM  Additional Follow-up by: Daine Gip,  August 16, 2009 4:27 PM  New Problems: OTHER SCREENING MAMMOGRAM (ICD-V76.12)   New Problems: OTHER SCREENING MAMMOGRAM (ICD-V76.12)

## 2010-06-10 NOTE — Letter (Signed)
Summary: Results Follow up Letter  [place] [facility]  [address] [place], [place] [address]   Phone: [phone]  Fax: [phone]    09/05/2009 MRN: [id number]  [facility] [address] [place], [place]  [address]  Dear Ms. Fludd,  The following are the results of your recent test(s):  Test         Result    Pap Smear:        Normal _____  Not Normal _____ Comments: ______________________________________________________ Cholesterol: LDL(Bad cholesterol):         Your goal is less than:         HDL (Good cholesterol):       Your goal is more than: Comments:  ______________________________________________________ Mammogram:        Normal __X___  Not Normal _____ Comments: Mammo looks fine, repeat recommended in 1-2 years.  ___________________________________________________________________ Hemoccult:        Normal _____  Not normal _______ Comments:    _____________________________________________________________________ Other Tests:    We routinely do not discuss normal results over the telephone.  If you desire a copy of the results, or you have any questions about this information we can discuss them at your next office visit.   Sincerely,      Tillman Abide, MD

## 2010-06-12 NOTE — Assessment & Plan Note (Signed)
Summary: vaginal infection/alc   Vital Signs:  Patient profile:   65 year old female Weight:      221 pounds Temp:     98.0 degrees F oral Pulse rate:   95 / minute Pulse rhythm:   regular BP sitting:   144 / 78  (left arm) Cuff size:   large  Vitals Entered By: Mervin Hack CMA Duncan Dull) (May 27, 2010 12:05 PM) CC: fungal infection?   History of Present Illness: Still with daily vaginal itching  whitish discharge--she brings in some on washcloth for me to check very irritated  brief help with metrogel vaginal  tried vinegar and cleansing douches for itching---some brief help no recent antibiotics  Allergies: 1)  ! Codeine  Past History:  Past medical, surgical, family and social histories (including risk factors) reviewed for relevance to current acute and chronic problems.  Past Medical History: Reviewed history from 12/28/2007 and no changes required. Allergic rhinitis Asthma Colonic polyps, hx of Diabetes mellitus, type II Hyperlipidemia Hypertension Osteoarthritis  Past Surgical History: Reviewed history from 12/28/2007 and no changes required. Cholecystectomy Hysterectomy LASIK  Family History: Reviewed history from 12/28/2007 and no changes required. Diabetes in both parents and 4 of 5 sibs Dad died of liver problems, brittle diabetes, asthma @78  Mom died @50  DM complications CAD in brother, mat GF Sister with carotid disease No HTN Mat aunt had breast cancer  Social History: Reviewed history from 01/07/2009 and no changes required. 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 1 applicatorful in vaginal two times a day for 5 days  Orders: Wet Prep (04540JW)  Complete Medication List: 1)  Novolin 70/30 Innolet 70-30 % Susp (Insulin isophane & regular) .... 35-40 units two times a day 2)  Advair Diskus 250-50 Mcg/dose Misc (Fluticasone-salmeterol) .Marland Kitchen.. 1 puff two times a day 3)  Ventolin Hfa 108 (90 Base) Mcg/act Aers (Albuterol sulfate) .... 2 puffs four times daily as needed for asthma 4)  Lisinopril 20 Mg Tabs (Lisinopril) .... Take 1 tablet by mouth once a day 5)  Alprazolam 0.5 Mg Tabs (Alprazolam) .... Take 1-2 tablet by mouth at bedtime as needed 6)  Metformin Hcl 500 Mg Xr24h-tab (Metformin hcl) .... 2 tablets daily before morning meal 7)  Novolin R 100 Unit/ml Inj Soln (Insulin regular human) .... 2-10 units as needed before meals  by sliding scale 8)  Lovastatin 40 Mg Tabs (Lovastatin) .Marland Kitchen.. 1 daily by mouth 9)  Accu-chek Aviva Strp (Glucose blood) .... Check  blood glucose two times a day 10)  Easy Comfort Insulin Syringe 30g X 5/16" 0.5 Ml Misc (Insulin syringe-needle u-100) .... Inject 20 - 25 units two times a day sliding scale 11)  Fluconazole 100 Mg Tabs (Fluconazole) .Marland Kitchen.. 1 daily for a week for yeast infection. repeat after a week if not resolved 12)  Ketoconazole 2 % Crea (Ketoconazole) .... Apply two times a day to rash till clear  Patient Instructions:  1)  Keep regular follow up 2)  Call if not much better in 2 weeks Prescriptions: KETOCONAZOLE 2 % CREA (KETOCONAZOLE) apply two times a day to rash till clear  #30gm x 1   Entered and Authorized by:   Cindee Salt MD   Signed by:   Cindee Salt MD on 05/27/2010   Method used:   Print then Give to Patient   RxID:   [id number] FLUCONAZOLE  100 MG TABS (FLUCONAZOLE) 1 daily for a week for yeast infection. Repeat after a week if not resolved  #14 x 1   Entered and Authorized by:   Cindee Salt MD   Signed by:   Cindee Salt MD on 05/27/2010   Method used:   Print then Give to Patient   RxID:   [id number]    Orders Added: 1)  Est. Patient Level III [41324] 2)  Wet Prep [40102VO]    Current Allergies (reviewed today): ! CODEINE  Laboratory Results    [facility] Source: vaginal WBC/hpf: 1-5 Bacteria/hpf: rare Clue cells/hpf: none  Negative whiff Yeast/hpf: few Wet [place] KOH: 1+ Trichomonas/hpf: none

## 2010-07-15 ENCOUNTER — Encounter: Payer: Self-pay | Admitting: Internal Medicine

## 2010-09-01 ENCOUNTER — Ambulatory Visit (INDEPENDENT_AMBULATORY_CARE_PROVIDER_SITE_OTHER): Payer: PRIVATE HEALTH INSURANCE | Admitting: Internal Medicine

## 2010-09-01 ENCOUNTER — Encounter: Payer: Self-pay | Admitting: Internal Medicine

## 2010-09-01 VITALS — BP 128/78 | HR 81 | Temp 98.0°F | Ht 65.0 in | Wt 219.0 lb

## 2010-09-01 DIAGNOSIS — J45909 Unspecified asthma, uncomplicated: Secondary | ICD-10-CM

## 2010-09-01 DIAGNOSIS — E119 Type 2 diabetes mellitus without complications: Secondary | ICD-10-CM

## 2010-09-01 DIAGNOSIS — I10 Essential (primary) hypertension: Secondary | ICD-10-CM

## 2010-09-01 DIAGNOSIS — E785 Hyperlipidemia, unspecified: Secondary | ICD-10-CM

## 2010-09-01 LAB — HEMOGLOBIN A1C: Hgb A1c MFr Bld: 10.5 % — ABNORMAL HIGH (ref 4.6–6.5)

## 2010-09-01 MED ORDER — LOVASTATIN 40 MG PO TABS: 40.0000 mg | ORAL_TABLET | Freq: Every day | ORAL | Status: DC

## 2010-09-01 MED ORDER — ALPRAZOLAM 0.5 MG PO TABS: 0.5000 mg | ORAL_TABLET | Freq: Every evening | ORAL | Status: DC | PRN

## 2010-09-01 MED ORDER — LISINOPRIL 20 MG PO TABS: 20.0000 mg | ORAL_TABLET | Freq: Every day | ORAL | Status: DC

## 2010-09-01 NOTE — Progress Notes (Signed)
Subjective:    Patient ID: Jamie Strickland, female    DOB: Jun 27, 1945, 65 y.o.   MRN: [id number]  HPI Vaginitis has resolved Back has been okay  Checks sugars at least daily Run about 120 in AM but can be as high as 200 No hypoglycemic reactions of significance--may go down with infections but none recently Keeps up with eye exams---cataracts which aren't ready for surgery and no retinopathy  No chest pain No SOB Some nasal symptoms with her allergies---uses benedryl at bedtime Claritin for daytime  Hands have been painful Used coated aspirin prn  No wheezing No persistent cough---now that trees are past peak  Current outpatient prescriptions:albuterol (VENTOLIN HFA) 108 (90 BASE) MCG/ACT inhaler, Inhale 2 puffs four times daily as needed for asthma , Disp: , Rfl: ;  ALPRAZolam (XANAX) 0.5 MG tablet, Take 1-2 tablet by mouth at bedtime as needed , Disp: , Rfl: ;  Fluticasone-Salmeterol (ADVAIR DISKUS) 250-50 MCG/DOSE AEPB, One puff two times a day , Disp: , Rfl:  glucose blood (ACCU-CHEK AVIVA) test strip, Check blood glucose two times a day , Disp: , Rfl: ;  insulin NPH-insulin regular (NOVOLIN 70/30 INNOLET) (70-30) 100 UNIT/ML injection, Inject into the skin. 35-40 units two times a day , Disp: , Rfl: ;  insulin regular (HUMULIN R,NOVOLIN R) 100 UNIT/ML injection, Inject into the skin. 2-10 units as needed before meals by sliding scale , Disp: , Rfl:  Insulin Syringe-Needle U-100 (EASY COMFORT INSULIN SYRINGE) 30G X 5/16" 0.5 ML MISC, Inject 20-25 units two times a day sliding scale , Disp: , Rfl: ;  lisinopril (PRINIVIL,ZESTRIL) 20 MG tablet, Take 1 tablet (20 mg total) by mouth daily., Disp: 30 tablet, Rfl: 11;  lovastatin (MEVACOR) 40 MG tablet, Take 1 tablet (40 mg total) by mouth at bedtime., Disp: 30 tablet, Rfl: 11 metFORMIN (GLUCOPHAGE) 500 MG tablet, Take 2 tablets daily before morning meal , Disp: , Rfl: ;  DISCONTD: lisinopril (PRINIVIL,ZESTRIL) 20 MG tablet, Take 20 mg by  mouth daily.  , Disp: , Rfl: ;  DISCONTD: lovastatin (MEVACOR) 40 MG tablet, Take 40 mg by mouth at bedtime.  , Disp: , Rfl: ;  DISCONTD: fluconazole (DIFLUCAN) 100 MG tablet, Take one daily for a week for yeast infection. Repeat after a week if not resolves. , Disp: , Rfl:  DISCONTD: ketoconazole (NIZORAL) 2 % cream, Apply two times a day to rash till clear , Disp: , Rfl:   Past Medical History  Diagnosis Date  . Allergic rhinitis   . Hx of colonic polyps   . Diabetes mellitus type II   . Hyperlipidemia   . Hypertension   . Osteoarthritis     Past Surgical History  Procedure Date  . Cholecystectomy   . Lasik     Family History  Problem Relation Age of Onset  . Diabetes Mother   . Diabetes Father     brittle diabetes  . Asthma Father 59  . Liver disease Father     liver problems  . Heart disease Brother     CAD  . Cancer Maternal Aunt     Breast  . Heart disease Maternal Grandfather     CAD    History   Social History  . Marital Status: Divorced    Spouse Name: N/A    Number of Children: 2  . Years of Education: N/A   Occupational History  . LPN     on nights at [employer]   Social History Main Topics  .  Smoking status: Former Games developer  . Smokeless tobacco: Former Neurosurgeon    Quit date: 08/09/2008  . Alcohol Use: Yes     Rare  . Drug Use: Not on file  . Sexually Active: Not on file   Other Topics Concern  . Not on file   Social History Narrative  . No narrative on file   Review of Systems Weight is fairly stable Sleeps fairly well Still works 3rd shift    Objective:   Physical Exam  Constitutional: She appears well-developed and well-nourished. No distress.  Neck: Normal range of motion. Neck supple. No thyromegaly present.  Cardiovascular: Normal rate, regular rhythm, normal heart sounds and intact distal pulses.  Exam reveals no gallop.   No murmur heard. Pulmonary/Chest: Effort normal and breath sounds normal. No respiratory distress. She has no  wheezes. She has no rales.  Musculoskeletal: Normal range of motion. She exhibits no edema and no tenderness.  Lymphadenopathy:    She has no cervical adenopathy.  Neurological:       Sensation intact on plantar feet  Skin: Skin is warm. No rash noted.       No ulcers  Psychiatric: She has a normal mood and affect. Her behavior is normal. Judgment and thought content normal.          Assessment & Plan:

## 2010-09-04 ENCOUNTER — Telehealth: Payer: Self-pay | Admitting: *Deleted

## 2010-09-04 ENCOUNTER — Other Ambulatory Visit: Payer: Self-pay | Admitting: *Deleted

## 2010-09-04 ENCOUNTER — Encounter: Payer: Self-pay | Admitting: *Deleted

## 2010-09-04 MED ORDER — METFORMIN HCL 1000 MG PO TABS: 1000.0000 mg | ORAL_TABLET | Freq: Two times a day (BID) | ORAL | Status: DC

## 2010-09-04 NOTE — Telephone Encounter (Signed)
Left message on machine asking patient to return my call about lab results.

## 2010-09-04 NOTE — Telephone Encounter (Signed)
Message copied by Mervin Hack on Thu Sep 04, 2010 10:47 AM ------      Message from: Tillman Abide      Created: Mon Sep 01, 2010  5:32 PM       Please call      The hgbA1c is up to 10.5%      As we discussed, we will increase the metformin to 1000mg  bid      Okay to send new Rx for 1 year      She should do the best she can with proper eating and daily exercise      We will recheck at her next visit

## 2010-09-04 NOTE — Telephone Encounter (Signed)
Patient called and asked that lab results, be mailed to her home address, per Jacki Cones. Results mailed.

## 2010-09-18 ENCOUNTER — Other Ambulatory Visit: Payer: Self-pay | Admitting: Internal Medicine

## 2010-11-16 ENCOUNTER — Other Ambulatory Visit: Payer: Self-pay | Admitting: Internal Medicine

## 2010-11-17 NOTE — Telephone Encounter (Signed)
rx called into pharmacy

## 2010-11-17 NOTE — Telephone Encounter (Signed)
Okay #60 x 1 

## 2010-11-27 ENCOUNTER — Other Ambulatory Visit: Payer: Self-pay | Admitting: Internal Medicine

## 2011-01-25 ENCOUNTER — Other Ambulatory Visit: Payer: Self-pay | Admitting: Internal Medicine

## 2011-01-26 NOTE — Telephone Encounter (Signed)
rx called into pharmacy

## 2011-01-26 NOTE — Telephone Encounter (Signed)
Okay #60 x 1 

## 2011-02-08 ENCOUNTER — Other Ambulatory Visit: Payer: Self-pay | Admitting: Internal Medicine

## 2011-02-18 ENCOUNTER — Other Ambulatory Visit: Payer: Self-pay | Admitting: Internal Medicine

## 2011-02-25 ENCOUNTER — Telehealth: Payer: Self-pay | Admitting: *Deleted

## 2011-02-25 DIAGNOSIS — Z1231 Encounter for screening mammogram for malignant neoplasm of breast: Secondary | ICD-10-CM

## 2011-02-25 NOTE — Telephone Encounter (Signed)
Jamie Strickland called from [facility] requesting an order for a digital screening mammogram.  This order can either be faxed to them at [phone] or put in the computer.  Patient is coming for appt first thing tomorrow morning and they are requesting the order today.  Please advise.

## 2011-02-26 ENCOUNTER — Ambulatory Visit: Payer: Self-pay | Admitting: Internal Medicine

## 2011-02-26 NOTE — Telephone Encounter (Signed)
Order done

## 2011-03-02 ENCOUNTER — Encounter: Payer: Self-pay | Admitting: *Deleted

## 2011-03-02 ENCOUNTER — Encounter: Payer: Self-pay | Admitting: Internal Medicine

## 2011-03-02 ENCOUNTER — Ambulatory Visit (INDEPENDENT_AMBULATORY_CARE_PROVIDER_SITE_OTHER): Payer: PRIVATE HEALTH INSURANCE | Admitting: Internal Medicine

## 2011-03-02 DIAGNOSIS — J45909 Unspecified asthma, uncomplicated: Secondary | ICD-10-CM

## 2011-03-02 DIAGNOSIS — I10 Essential (primary) hypertension: Secondary | ICD-10-CM

## 2011-03-02 DIAGNOSIS — E119 Type 2 diabetes mellitus without complications: Secondary | ICD-10-CM

## 2011-03-02 NOTE — Telephone Encounter (Signed)
Was told that no order was  Needed so Delford Field will do the MMG as scheduled. MK

## 2011-03-02 NOTE — Assessment & Plan Note (Signed)
BP Readings from Last 3 Encounters:  03/02/11 138/70  09/01/10 128/78  05/27/10 144/78   Good control No changes needed

## 2011-03-02 NOTE — Assessment & Plan Note (Signed)
Mild and intermittent No changes needed No recent albuterol use

## 2011-03-02 NOTE — Progress Notes (Signed)
Subjective:    Patient ID: Jamie Strickland, female    DOB: [birth year], 65 y.o.   MRN: [id number]  HPI Got wrapped up in dog leash when another dog attacked hers Larey Seat but no sig injuries  Checks sugars occ 170 fasting today No insulin adjustment Takes 70/30 -- 40 units bid Has cut out diet sodas---trying to eat healthier  Breathing okay Some sinus congestion from allergies No regular cough Walks about 2 miles per day  No chest pain No SOB  Current Outpatient Prescriptions on File Prior to Visit  Medication Sig Dispense Refill  . ACCU-CHEK AVIVA PLUS test strip CHECK BLOOD GLUCOSE TWICE DAILY  100 strip  0  . ADVAIR DISKUS 250-50 MCG/DOSE AEPB INHALE 1 PUFF BY MOUTH TWICE DAILY  60 each  0  . albuterol (VENTOLIN HFA) 108 (90 BASE) MCG/ACT inhaler Inhale 2 puffs four times daily as needed for asthma       . ALPRAZolam (XANAX) 0.5 MG tablet TAKE 1 TO 2 TABLETS BY MOUTH EVERY NIGHT AT BEDTIME AS NEEDED SLEEP  60 tablet  1  . insulin NPH-insulin regular (NOVOLIN 70/30 INNOLET) (70-30) 100 UNIT/ML injection Inject into the skin. 35-40 units two times a day       . insulin regular (HUMULIN R,NOVOLIN R) 100 UNIT/ML injection Inject into the skin. 2-10 units as needed before meals by sliding scale       . Insulin Syringe-Needle U-100 (EASY COMFORT INSULIN SYRINGE) 30G X 5/16" 0.5 ML MISC Inject 20-25 units two times a day sliding scale       . lisinopril (PRINIVIL,ZESTRIL) 20 MG tablet TAKE 1 TABLET BY MOUTH EVERY DAY  30 tablet  PRN  . lovastatin (MEVACOR) 40 MG tablet TAKE ONE TABLET BY MOUTH DAILY  30 tablet  PRN  . metFORMIN (GLUCOPHAGE) 1000 MG tablet Take 1 tablet (1,000 mg total) by mouth 2 (two) times daily with a meal.  180 tablet  3    Allergies  Allergen Reactions  . Codeine     REACTION: n /v    Past Medical History  Diagnosis Date  . Allergic rhinitis   . Hx of colonic polyps   . Diabetes mellitus type II   . Hyperlipidemia   . Hypertension   . Osteoarthritis      Past Surgical History  Procedure Date  . Cholecystectomy   . Lasik     Family History  Problem Relation Age of Onset  . Diabetes Mother   . Diabetes Father     brittle diabetes  . Asthma Father 41  . Liver disease Father     liver problems  . Heart disease Brother     CAD  . Cancer Maternal Aunt     Breast  . Heart disease Maternal Grandfather     CAD    History   Social History  . Marital Status: Divorced    Spouse Name: N/A    Number of Children: 2  . 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 Smoking status: Former Games developer  . Smokeless tobacco: Former Neurosurgeon    Quit date: 08/09/2008  . Alcohol Use: Yes     Rare  . Drug Use: Not on file  . Sexually Active: Not on file   Other Topics Concern  . Not on file   Social History Narrative  . No narrative on file  Review of Systems Sleeps okay  Appetite is fine Weight is stable    Objective:   Physical Exam  Constitutional: She appears well-developed and well-nourished. No distress.  Neck: Normal range of motion. Neck supple. No thyromegaly present.  Cardiovascular: Normal rate, regular rhythm, normal heart sounds and intact distal pulses.  Exam reveals no gallop.   No murmur heard. Pulmonary/Chest: Effort normal and breath sounds normal. No respiratory distress. She has no wheezes. She has no rales.  Musculoskeletal: Normal range of motion. She exhibits no edema and no tenderness.  Lymphadenopathy:    She has no cervical adenopathy.  Psychiatric: She has a normal mood and affect. Her behavior is normal. Judgment and thought content normal.          Assessment & Plan:

## 2011-03-02 NOTE — Assessment & Plan Note (Signed)
Has had poor control Hopefully some  Better Discussed options If still >9%, will increase insulin to 50 bid and titrate Next option is glipizide

## 2011-03-19 ENCOUNTER — Other Ambulatory Visit: Payer: Self-pay | Admitting: *Deleted

## 2011-03-19 MED ORDER — INSULIN NPH ISOPHANE & REGULAR (70-30) 100 UNIT/ML ~~LOC~~ SUSP: 50.0000 [IU] | Freq: Two times a day (BID) | SUBCUTANEOUS | Status: DC

## 2011-05-01 ENCOUNTER — Other Ambulatory Visit: Payer: Self-pay | Admitting: Internal Medicine

## 2011-05-04 ENCOUNTER — Other Ambulatory Visit: Payer: Self-pay | Admitting: *Deleted

## 2011-05-04 MED ORDER — INSULIN REGULAR HUMAN 100 UNIT/ML IJ SOLN: INTRAMUSCULAR | Status: DC

## 2011-05-28 ENCOUNTER — Other Ambulatory Visit: Payer: Self-pay | Admitting: Internal Medicine

## 2011-05-29 NOTE — Telephone Encounter (Signed)
rx called into pharmacy

## 2011-05-29 NOTE — Telephone Encounter (Signed)
Okay #60 x 1 

## 2011-07-13 ENCOUNTER — Other Ambulatory Visit: Payer: Self-pay | Admitting: Internal Medicine

## 2011-08-25 ENCOUNTER — Other Ambulatory Visit: Payer: Self-pay | Admitting: Internal Medicine

## 2011-08-31 ENCOUNTER — Ambulatory Visit (INDEPENDENT_AMBULATORY_CARE_PROVIDER_SITE_OTHER): Payer: PRIVATE HEALTH INSURANCE | Admitting: Internal Medicine

## 2011-08-31 ENCOUNTER — Encounter: Payer: Self-pay | Admitting: Internal Medicine

## 2011-08-31 VITALS — BP 140/80 | HR 90 | Temp 98.4°F | Wt 212.0 lb

## 2011-08-31 DIAGNOSIS — IMO0001 Reserved for inherently not codable concepts without codable children: Secondary | ICD-10-CM

## 2011-08-31 DIAGNOSIS — Z79899 Other long term (current) drug therapy: Secondary | ICD-10-CM | POA: Insufficient documentation

## 2011-08-31 DIAGNOSIS — Z0000 Encounter for general adult medical examination without abnormal findings: Secondary | ICD-10-CM

## 2011-08-31 DIAGNOSIS — E1165 Type 2 diabetes mellitus with hyperglycemia: Secondary | ICD-10-CM | POA: Insufficient documentation

## 2011-08-31 DIAGNOSIS — IMO0002 Reserved for concepts with insufficient information to code with codable children: Secondary | ICD-10-CM | POA: Insufficient documentation

## 2011-08-31 DIAGNOSIS — Z2911 Encounter for prophylactic immunotherapy for respiratory syncytial virus (RSV): Secondary | ICD-10-CM

## 2011-08-31 DIAGNOSIS — E785 Hyperlipidemia, unspecified: Secondary | ICD-10-CM

## 2011-08-31 DIAGNOSIS — I10 Essential (primary) hypertension: Secondary | ICD-10-CM

## 2011-08-31 DIAGNOSIS — E11319 Type 2 diabetes mellitus with unspecified diabetic retinopathy without macular edema: Secondary | ICD-10-CM | POA: Insufficient documentation

## 2011-08-31 LAB — CBC WITH DIFFERENTIAL/PLATELET
Basophils Relative: 0.3 % (ref 0.0–3.0)
Eosinophils Absolute: 0.2 10*3/uL (ref 0.0–0.7)
Eosinophils Relative: 1.5 % (ref 0.0–5.0)
Hemoglobin: 15.3 g/dL — ABNORMAL HIGH (ref 12.0–15.0)
Lymphocytes Relative: 25.2 % (ref 12.0–46.0)
Monocytes Relative: 5.3 % (ref 3.0–12.0)
Neutrophils Relative %: 67.7 % (ref 43.0–77.0)
RBC: 5.07 Mil/uL (ref 3.87–5.11)
WBC: 12.3 10*3/uL — ABNORMAL HIGH (ref 4.5–10.5)

## 2011-08-31 MED ORDER — METFORMIN HCL 1000 MG PO TABS: 1000.0000 mg | ORAL_TABLET | Freq: Two times a day (BID) | ORAL | Status: DC

## 2011-08-31 MED ORDER — LISINOPRIL 20 MG PO TABS: 20.0000 mg | ORAL_TABLET | Freq: Every day | ORAL | Status: DC

## 2011-08-31 MED ORDER — LOVASTATIN 40 MG PO TABS: 40.0000 mg | ORAL_TABLET | Freq: Every day | ORAL | Status: DC

## 2011-08-31 NOTE — Assessment & Plan Note (Signed)
No problems with med Due for labs 

## 2011-08-31 NOTE — Progress Notes (Signed)
Subjective:    Patient ID: Jamie Strickland, female    DOB: [birth year], 66 y.o.   MRN: [id number]  HPI Here for physical Reviewed history---she had hysterectomy Hasn't yet gotten repeat colonoscopy---had polyps in past [facility] clinic doctor did it Already had mammo  Did try going up on insulin Didn't feel right so cut back to 35 units Has lost some weight 140 this morning Checks bid and takes extra short acting insulin if over 200  Current Outpatient Prescriptions on File Prior to Visit  Medication Sig Dispense Refill  . ACCU-CHEK AVIVA PLUS test strip USE TO CHECK BLOOD SUGAR TWICE DAILY  100 strip  0  . ADVAIR DISKUS 250-50 MCG/DOSE AEPB INHALE 1 PUFF BY MOUTH TWICE DAILY  60 each  1  . albuterol (VENTOLIN HFA) 108 (90 BASE) MCG/ACT inhaler Inhale 2 puffs four times daily as needed for asthma       . ALPRAZolam (XANAX) 0.5 MG tablet TAKE 1 TO 2 TABLETS BY MOUTH EVERY NIGHT AT BEDTIME AS NEEDED FOR SLEEP  60 tablet  1  . insulin regular (NOVOLIN R,HUMULIN R) 100 units/mL injection 2-10 units as needed before meals by sliding scale  10 mL  11  . Insulin Syringe-Needle U-100 (INSULIN SYRINGE .5CC/30GX5/16") 30G X 5/16" 0.5 ML MISC USE TO INJECT 2O TO 25 UNITS TWICE DAILY VIA SLIDING SCALE  100 each  0  . NOVOLIN 70/30 (70-30) 100 UNIT/ML injection INJECT 50 UNITS UNDER THE SKIN TWICE DAILY WITH A MEAL  10 mL  10  . DISCONTD: lisinopril (PRINIVIL,ZESTRIL) 20 MG tablet TAKE 1 TABLET BY MOUTH EVERY DAY  30 tablet  PRN  . DISCONTD: lovastatin (MEVACOR) 40 MG tablet TAKE ONE TABLET BY MOUTH DAILY  30 tablet  PRN  . DISCONTD: metFORMIN (GLUCOPHAGE) 1000 MG tablet Take 1 tablet (1,000 mg total) by mouth 2 (two) times daily with a meal.  180 tablet  3    Allergies  Allergen Reactions  . Codeine     REACTION: n /v    Past Medical History  Diagnosis Date  . Allergic rhinitis   . Hx of colonic polyps   . Diabetes mellitus type II   . Hyperlipidemia   . Hypertension   . Osteoarthritis       Past Surgical History  Procedure Date  . Cholecystectomy   . Lasik   . Abdominal hysterectomy 1972    Cervical carcinoma in situ    Family History  Problem Relation Age of Onset  . Diabetes Mother   . Diabetes Father     brittle diabetes  . Asthma Father 9  . Liver disease Father     liver problems  . Heart disease Brother     CAD  . Cancer Maternal Aunt     Breast  . Heart disease Maternal Grandfather     CAD    History   Social History  . Marital Status: Divorced    Spouse Name: N/A    Number of Children: 2  . 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 Smoking status: Former Games developer  . Smokeless tobacco: Former Neurosurgeon    Quit date: 08/09/2008  . Alcohol Use: Yes     Rare  . Drug Use: Not on file  . Sexually Active: Not on file   Other Topics Concern  . Not on file   Social History  Narrative  . No narrative on file   Review of Systems  Constitutional: Negative for fatigue.       Has lost a few pounds Wears seat belt  HENT: Positive for congestion and rhinorrhea. Negative for hearing loss, dental problem and tinnitus.        Regular with dentist Uses OTC med for allergies  Eyes: Negative for visual disturbance.       No diplopia or unilateral vision changes Early cataract on left Due for exam  Respiratory: Negative for cough, shortness of breath and wheezing.        Asthma has been quiet 1-2 daily with advair  Cardiovascular: Negative for chest pain, palpitations and leg swelling.  Gastrointestinal: Negative for nausea, vomiting, abdominal pain, constipation and blood in stool.       Uses bicarb for occ heartburn (2-3 per month)   Genitourinary: Negative for dysuria, urgency, difficulty urinating and dyspareunia.  Musculoskeletal: Positive for arthralgias. Negative for back pain and joint swelling.       Notes some hand stiffness mostly after getting up after sleep  Skin:  Negative for rash.       No suspicious areas  Neurological: Negative for dizziness, syncope, weakness, light-headedness, numbness and headaches.  Hematological: Negative for adenopathy. Does not bruise/bleed easily.  Psychiatric/Behavioral: Positive for sleep disturbance. Negative for dysphoric mood. The patient is not nervous/anxious.        Some sleep trouble--new neighbors are noisy in day       Objective:   Physical Exam  Constitutional: She is oriented to person, place, and time. She appears well-developed and well-nourished. No distress.  HENT:  Head: Normocephalic and atraumatic.  Right Ear: External ear normal.  Left Ear: External ear normal.  Mouth/Throat: Oropharynx is clear and moist. No oropharyngeal exudate.  Eyes: Conjunctivae and EOM are normal. Pupils are equal, round, and reactive to light.  Neck: Normal range of motion. Neck supple. No thyromegaly present.  Cardiovascular: Normal rate, regular rhythm, normal heart sounds and intact distal pulses.   Pulmonary/Chest: Effort normal and breath sounds normal. No respiratory distress. She has no wheezes. She has no rales.  Abdominal: Soft. There is no tenderness.  Genitourinary:       Breasts without mass or discharge  Musculoskeletal: She exhibits no edema and no tenderness.  Lymphadenopathy:    She has no cervical adenopathy.    She has no axillary adenopathy.  Neurological: She is alert and oriented to person, place, and time.  Skin: No rash noted. No erythema.       No foot lesions  Psychiatric: She has a normal mood and affect. Her behavior is normal.          Assessment & Plan:

## 2011-08-31 NOTE — Patient Instructions (Signed)
Please call [facility] to try to get your follow up colonoscopy scheduled

## 2011-08-31 NOTE — Assessment & Plan Note (Signed)
Lab Results  Component Value Date   HGBA1C 10.2* 03/02/2011   Has had poor control but doing better with weight and eating Will still need more insulin if a1c still over 9%

## 2011-08-31 NOTE — Assessment & Plan Note (Signed)
BP Readings from Last 3 Encounters:  08/31/11 140/80  03/02/11 138/70  09/01/10 128/78   Good contorl No changes needed

## 2011-08-31 NOTE — Assessment & Plan Note (Signed)
No acute problems Working on insurance for her follow up colonoscopy zostavax given today

## 2011-09-01 LAB — HEPATIC FUNCTION PANEL
ALT: 41 U/L — ABNORMAL HIGH (ref 0–35)
AST: 24 U/L (ref 0–37)
Alkaline Phosphatase: 66 U/L (ref 39–117)
Bilirubin, Direct: 0 mg/dL (ref 0.0–0.3)
Total Bilirubin: 0.3 mg/dL (ref 0.3–1.2)

## 2011-09-01 LAB — BASIC METABOLIC PANEL
Calcium: 10.9 mg/dL — ABNORMAL HIGH (ref 8.4–10.5)
Creatinine, Ser: 0.8 mg/dL (ref 0.4–1.2)
GFR: 78.96 mL/min (ref >60.00)
Sodium: 138 mEq/L (ref 135–145)

## 2011-09-01 LAB — LIPID PANEL
Total CHOL/HDL Ratio: 4
VLDL: 42.4 mg/dL — ABNORMAL HIGH (ref 0.0–40.0)

## 2011-09-01 LAB — MICROALBUMIN / CREATININE URINE RATIO
Creatinine,U: 65.2 mg/dL
Microalb Creat Ratio: 0.5 mg/g (ref 0.0–30.0)

## 2011-09-04 ENCOUNTER — Encounter: Payer: Self-pay | Admitting: *Deleted

## 2011-10-02 ENCOUNTER — Other Ambulatory Visit: Payer: Self-pay | Admitting: Internal Medicine

## 2011-10-02 NOTE — Telephone Encounter (Signed)
rx called to pharmacy 

## 2011-10-19 ENCOUNTER — Other Ambulatory Visit: Payer: Self-pay | Admitting: Internal Medicine

## 2011-10-19 NOTE — Telephone Encounter (Signed)
Patient stated that she was told a refill for entire year was being sent at her last office visit and it should have been written for the entire you to be picked up throughout the year.  She said that pharmacy was going to call us.  Please call patient to verify information for refills needed and her request for the prescription.  Patient seemed upset.  Please also provide service recovery and assist patient with meeting her needs.  Thanks

## 2011-10-19 NOTE — Telephone Encounter (Signed)
Spoke with patient and the pharmacist at [facility] and the prescriptions are at the pharmacy with refills, per the pharmacy pt picked up rx's on 10/05/11. Pt states that the pharmacy is telling her every refill needs to authorized and per the pharmacy no one has spoken to pt today. Pt states she will go into the pharmacy.

## 2011-11-02 ENCOUNTER — Other Ambulatory Visit: Payer: Self-pay | Admitting: Family Medicine

## 2011-11-02 NOTE — Telephone Encounter (Signed)
Medicine called to [facility]. 

## 2011-11-30 ENCOUNTER — Other Ambulatory Visit: Payer: Self-pay | Admitting: *Deleted

## 2011-11-30 NOTE — Telephone Encounter (Signed)
Faxed refill request from [facility], last filled 11/02/11.

## 2011-12-01 MED ORDER — ALPRAZOLAM 0.5 MG PO TABS: ORAL_TABLET | ORAL | Status: DC

## 2011-12-01 NOTE — Telephone Encounter (Signed)
Medication phoned to pharmacy.  

## 2011-12-01 NOTE — Telephone Encounter (Signed)
Please call in

## 2011-12-11 ENCOUNTER — Other Ambulatory Visit: Payer: Self-pay | Admitting: Internal Medicine

## 2012-01-07 ENCOUNTER — Other Ambulatory Visit: Payer: Self-pay | Admitting: Internal Medicine

## 2012-01-07 ENCOUNTER — Other Ambulatory Visit: Payer: Self-pay | Admitting: Family Medicine

## 2012-01-07 NOTE — Telephone Encounter (Signed)
Okay #60 x 0 

## 2012-01-07 NOTE — Telephone Encounter (Signed)
rx called into pharmacy

## 2012-01-22 ENCOUNTER — Ambulatory Visit (INDEPENDENT_AMBULATORY_CARE_PROVIDER_SITE_OTHER): Payer: PRIVATE HEALTH INSURANCE | Admitting: Internal Medicine

## 2012-01-22 ENCOUNTER — Encounter: Payer: Self-pay | Admitting: Internal Medicine

## 2012-01-22 VITALS — BP 134/74 | HR 90 | Temp 97.8°F | Wt 222.2 lb

## 2012-01-22 DIAGNOSIS — L03317 Cellulitis of buttock: Secondary | ICD-10-CM | POA: Insufficient documentation

## 2012-01-22 DIAGNOSIS — L0231 Cutaneous abscess of buttock: Secondary | ICD-10-CM

## 2012-01-22 MED ORDER — CLINDAMYCIN HCL 300 MG PO CAPS: 300.0000 mg | ORAL_CAPSULE | Freq: Three times a day (TID) | ORAL | Status: AC

## 2012-01-22 NOTE — Assessment & Plan Note (Signed)
Not clear if just boil or cyst but clearly infected Not fluctuance to require I&D but recommended warm compresses Will Rx with clinda

## 2012-01-22 NOTE — Progress Notes (Signed)
Subjective:    Patient ID: Jamie Strickland, female    DOB: 1946-04-24, 66 y.o.   MRN: [id number]  HPI Has spot on her buttocks that she noticed about 5 days ago Now has increased to 3 times the size [place]  Not bites or injury May have drained a tiny bit  Current Outpatient Prescriptions on File Prior to Visit  Medication Sig Dispense Refill  . ACCU-CHEK AVIVA PLUS test strip TEST BLOOD SUGAR TWICE DAILY  100 strip  11  . ADVAIR DISKUS 250-50 MCG/DOSE AEPB INHALE 1 PUFF BY MOUTH TWICE DAILY  60 each  3  . albuterol (VENTOLIN HFA) 108 (90 BASE) MCG/ACT inhaler Inhale 2 puffs four times daily as needed for asthma       . ALPRAZolam (XANAX) 0.5 MG tablet Take 1-2 tablets (0.5-1 mg total) by mouth at bedtime as needed for sleep.  60 tablet  0  . aspirin EC 81 MG tablet Take 81 mg by mouth daily.      . insulin regular (NOVOLIN R,HUMULIN R) 100 units/mL injection 2-10 units as needed before meals by sliding scale  10 mL  11  . Insulin Syringe-Needle U-100 (INSULIN SYRINGE .5CC/30GX5/16") 30G X 5/16" 0.5 ML MISC USE AS DIRECTED  100 each  11  . lisinopril (PRINIVIL,ZESTRIL) 20 MG tablet TAKE 1 TABLET BY MOUTH EVERY DAY  30 tablet  PRN  . lovastatin (MEVACOR) 40 MG tablet TAKE ONE TABLET BY MOUTH DAILY  30 tablet  PRN  . metFORMIN (GLUCOPHAGE) 1000 MG tablet Take 1 tablet (1,000 mg total) by mouth 2 (two) times daily with a meal.  180 tablet  3  . NOVOLIN 70/30 (70-30) 100 UNIT/ML injection INJECT 50 UNITS UNDER THE SKIN TWICE DAILY WITH A MEAL  10 mL  10    Allergies  Allergen Reactions  . Codeine     REACTION: n /v    Past Medical History  Diagnosis Date  . Allergic rhinitis   . Hx of colonic polyps   . Diabetes mellitus type II   . Hyperlipidemia   . Hypertension   . Osteoarthritis     Past Surgical History  Procedure Date  . Cholecystectomy   . Lasik   . Abdominal hysterectomy 1972    Cervical carcinoma in situ    Family History  Problem Relation Age of  Onset  . Diabetes Mother   . Diabetes Father     brittle diabetes  . Asthma Father 4  . Liver disease Father     liver problems  . Heart disease Brother     CAD  . Cancer Maternal Aunt     Breast  . Heart disease Maternal Grandfather     CAD    History   Social History  . Marital Status: Divorced    Spouse Name: N/A    Number of Children: 2  . 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 Smoking status: Former Games developer  . Smokeless tobacco: Former Neurosurgeon    Quit date: 08/09/2008  . Alcohol Use: Yes     Rare  . Drug Use: Not on file  . Sexually Active: Not on file   Other Topics Concern  . Not on file   Social History Narrative  . No narrative on file   Review of Systems No fever No other sores--no exposure to known  MRSA, etc    Objective:   Physical Exam  Constitutional: She appears well-developed and well-nourished. No distress.  Skin:       Induration on right inner lower buttock with scab on top Red, warm and tender but no fluctuance Total size ~6cm with ~2cm of induration Can't tell if underlying cyst          Assessment & Plan:

## 2012-02-05 ENCOUNTER — Other Ambulatory Visit: Payer: Self-pay | Admitting: Internal Medicine

## 2012-02-05 NOTE — Telephone Encounter (Signed)
rx called into pharmacy

## 2012-02-05 NOTE — Telephone Encounter (Signed)
Okay #60 x 0 

## 2012-02-29 ENCOUNTER — Ambulatory Visit (INDEPENDENT_AMBULATORY_CARE_PROVIDER_SITE_OTHER): Payer: Commercial Managed Care - PPO | Admitting: Internal Medicine

## 2012-02-29 ENCOUNTER — Encounter: Payer: Self-pay | Admitting: Internal Medicine

## 2012-02-29 VITALS — BP 138/78 | HR 87 | Temp 98.3°F | Wt 224.0 lb

## 2012-02-29 DIAGNOSIS — E785 Hyperlipidemia, unspecified: Secondary | ICD-10-CM

## 2012-02-29 DIAGNOSIS — IMO0001 Reserved for inherently not codable concepts without codable children: Secondary | ICD-10-CM

## 2012-02-29 DIAGNOSIS — I10 Essential (primary) hypertension: Secondary | ICD-10-CM

## 2012-02-29 DIAGNOSIS — J45909 Unspecified asthma, uncomplicated: Secondary | ICD-10-CM

## 2012-02-29 DIAGNOSIS — E669 Obesity, unspecified: Secondary | ICD-10-CM

## 2012-02-29 MED ORDER — ATORVASTATIN CALCIUM 20 MG PO TABS: 20.0000 mg | ORAL_TABLET | Freq: Every day | ORAL | Status: DC

## 2012-02-29 NOTE — Assessment & Plan Note (Signed)
Really needs to work on fitness 

## 2012-02-29 NOTE — Assessment & Plan Note (Signed)
Uses more albuterol in fall and spring Will consider montelukast

## 2012-02-29 NOTE — Assessment & Plan Note (Signed)
BP Readings from Last 3 Encounters:  02/29/12 138/78  01/22/12 134/74  08/31/11 140/80   Acceptable control No changes needed

## 2012-02-29 NOTE — Progress Notes (Signed)
Subjective:    Patient ID: Jamie Strickland, female    DOB: 1945/07/02, 66 y.o.   MRN: [id number]  HPI Infection did clear up  Having problems with allergies Ragweed sensitive Uses meds only sparingly Uses albuterol daily now--then stops after the first frost benedryl at night helps  Checks sugars twice a day Does increase insulin as needed Now up to 45 units bid  No sig hypoglycemic reactions Not high unless she has infection  Discussed statin Not at goal on lovastatin  No apparent side effects  Current Outpatient Prescriptions on File Prior to Visit  Medication Sig Dispense Refill  . ACCU-CHEK AVIVA PLUS test strip TEST BLOOD SUGAR TWICE DAILY  100 strip  11  . ADVAIR DISKUS 250-50 MCG/DOSE AEPB INHALE 1 PUFF BY MOUTH TWICE DAILY  60 each  3  . albuterol (VENTOLIN HFA) 108 (90 BASE) MCG/ACT inhaler Inhale 2 puffs four times daily as needed for asthma       . ALPRAZolam (XANAX) 0.5 MG tablet TAKE 1 TO 2 TABLETS BY MOUTH EVERY NIGHT AT BEDTIME AS NEEDED FOR SLEEP  60 tablet  0  . aspirin EC 81 MG tablet Take 81 mg by mouth daily.      . insulin regular (NOVOLIN R,HUMULIN R) 100 units/mL injection 2-10 units as needed before meals by sliding scale  10 mL  11  . Insulin Syringe-Needle U-100 (INSULIN SYRINGE .5CC/30GX5/16") 30G X 5/16" 0.5 ML MISC USE AS DIRECTED  100 each  11  . lisinopril (PRINIVIL,ZESTRIL) 20 MG tablet TAKE 1 TABLET BY MOUTH EVERY DAY  30 tablet  PRN  . lovastatin (MEVACOR) 40 MG tablet TAKE ONE TABLET BY MOUTH DAILY  30 tablet  PRN  . metFORMIN (GLUCOPHAGE) 1000 MG tablet Take 1 tablet (1,000 mg total) by mouth 2 (two) times daily with a meal.  180 tablet  3  . DISCONTD: NOVOLIN 70/30 (70-30) 100 UNIT/ML injection INJECT 50 UNITS UNDER THE SKIN TWICE DAILY WITH A MEAL  10 mL  10    Allergies  Allergen Reactions  . Codeine     REACTION: n /v    Past Medical History  Diagnosis Date  . Allergic rhinitis   . Hx of colonic polyps   . Diabetes mellitus type II    . Hyperlipidemia   . Hypertension   . Osteoarthritis     Past Surgical History  Procedure Date  . Cholecystectomy   . Lasik   . Abdominal hysterectomy 1972    Cervical carcinoma in situ    Family History  Problem Relation Age of Onset  . Diabetes Mother   . Diabetes Father     brittle diabetes  . Asthma Father 76  . Liver disease Father     liver problems  . Heart disease Brother     CAD  . Cancer Maternal Aunt     Breast  . Heart disease Maternal Grandfather     CAD    History   Social History  . Marital Status: Divorced    Spouse Name: N/A    Number of Children: 2  . Years of Education: N/A   Occupational History  . LPN     on nights at [employer]   Social History Main Topics  . Smoking status: Former Games developer  . Smokeless tobacco: Former Neurosurgeon    Quit date: 08/09/2008  . Alcohol Use: Yes     Rare  . Drug Use: Not on file  . Sexually Active: Not on  file   Other Topics Concern  . Not on file   Social History Narrative  . No narrative on file   Review of Systems Weight up 2#---had keys when weighed Sleeps fair but has to use xanax---works 3rd shift    Objective:   Physical Exam  Constitutional: She appears well-developed and well-nourished. No distress.  Neck: Normal range of motion. Neck supple. No thyromegaly present.  Cardiovascular: Normal rate, regular rhythm, normal heart sounds and intact distal pulses.  Exam reveals no gallop.   No murmur heard. Pulmonary/Chest: Effort normal and breath sounds normal. No respiratory distress. She has no wheezes. She has no rales.  Musculoskeletal: She exhibits no edema and no tenderness.  Lymphadenopathy:    She has no cervical adenopathy.  Psychiatric: She has a normal mood and affect. Her behavior is normal. Thought content normal.          Assessment & Plan:

## 2012-02-29 NOTE — Assessment & Plan Note (Signed)
Was better last time but still not at goal Discussed fitness and weight loss Lab Results  Component Value Date   HGBA1C 9.1* 08/31/2011

## 2012-02-29 NOTE — Patient Instructions (Signed)
Please stop lovastatin and start atorvastatin. Call if any problems Set up lab appointment in 4-6 weeks---- A1c (250.02) and lipid, hepatic (272.4)

## 2012-02-29 NOTE — Assessment & Plan Note (Signed)
Lab Results  Component Value Date   LDLCALC 84 03/04/2009   Discussed Rx  Will switch to atorvastatin since last LDL 111

## 2012-03-14 ENCOUNTER — Other Ambulatory Visit: Payer: Self-pay | Admitting: Internal Medicine

## 2012-03-15 NOTE — Telephone Encounter (Signed)
Okay #60 x 0 

## 2012-03-15 NOTE — Telephone Encounter (Signed)
rx called into pharmacy

## 2012-04-11 ENCOUNTER — Other Ambulatory Visit (INDEPENDENT_AMBULATORY_CARE_PROVIDER_SITE_OTHER): Payer: Commercial Managed Care - PPO

## 2012-04-11 ENCOUNTER — Telehealth: Payer: Self-pay | Admitting: Radiology

## 2012-04-11 DIAGNOSIS — Z86010 Personal history of colonic polyps: Secondary | ICD-10-CM

## 2012-04-11 DIAGNOSIS — E785 Hyperlipidemia, unspecified: Secondary | ICD-10-CM

## 2012-04-11 DIAGNOSIS — Z1231 Encounter for screening mammogram for malignant neoplasm of breast: Secondary | ICD-10-CM

## 2012-04-11 DIAGNOSIS — IMO0001 Reserved for inherently not codable concepts without codable children: Secondary | ICD-10-CM

## 2012-04-11 LAB — HEMOGLOBIN A1C: Hgb A1c MFr Bld: 10.4 % — ABNORMAL HIGH (ref 4.6–6.5)

## 2012-04-11 LAB — HEPATIC FUNCTION PANEL
Albumin: 4 g/dL (ref 3.5–5.2)
Alkaline Phosphatase: 72 U/L (ref 39–117)
Total Protein: 7.4 g/dL (ref 6.0–8.3)

## 2012-04-11 LAB — LIPID PANEL: HDL: 37.4 mg/dL — ABNORMAL LOW (ref >39.00)

## 2012-04-11 NOTE — Telephone Encounter (Signed)
Patient want orders for a mammogram at the [facility] in [place], and a colonoscopy referral for [facility]. Patient work s 3 rd shift please lmom.

## 2012-04-12 NOTE — Telephone Encounter (Signed)
Called the patient and she wants to make her own MMG appt at [facility], gave her the number to call to schedule. Patient has an appt at [facility] on January 8th with Dr Nigel Bridgeman N.P. She will be set up for the Colon that day and I asked them to please send you a copy of the procedure.

## 2012-04-15 ENCOUNTER — Other Ambulatory Visit: Payer: Self-pay | Admitting: Internal Medicine

## 2012-04-15 NOTE — Telephone Encounter (Signed)
rx called to pharmacy 

## 2012-04-15 NOTE — Telephone Encounter (Signed)
Okay #60 x 0 

## 2012-05-11 HISTORY — PX: CATARACT EXTRACTION W/ INTRAOCULAR LENS  IMPLANT, BILATERAL: SHX1307

## 2012-05-16 ENCOUNTER — Other Ambulatory Visit: Payer: Self-pay | Admitting: Internal Medicine

## 2012-05-16 NOTE — Telephone Encounter (Signed)
Okay #60 x 0 

## 2012-05-16 NOTE — Telephone Encounter (Signed)
rx called into pharmacy

## 2012-05-25 ENCOUNTER — Other Ambulatory Visit: Payer: Self-pay | Admitting: *Deleted

## 2012-05-25 MED ORDER — FLUTICASONE-SALMETEROL 250-50 MCG/DOSE IN AEPB: INHALATION_SPRAY | RESPIRATORY_TRACT | Status: DC

## 2012-06-08 ENCOUNTER — Other Ambulatory Visit: Payer: Self-pay | Admitting: Internal Medicine

## 2012-07-01 ENCOUNTER — Other Ambulatory Visit: Payer: Self-pay | Admitting: Internal Medicine

## 2012-07-01 NOTE — Telephone Encounter (Signed)
rx called into pharmacy

## 2012-07-01 NOTE — Telephone Encounter (Signed)
Okay #60 x 0 

## 2012-08-05 ENCOUNTER — Other Ambulatory Visit: Payer: Self-pay | Admitting: Internal Medicine

## 2012-08-08 NOTE — Telephone Encounter (Signed)
Okay #60 x 0 

## 2012-08-08 NOTE — Telephone Encounter (Signed)
rx called into pharmacy

## 2012-09-06 ENCOUNTER — Encounter: Payer: Self-pay | Admitting: Internal Medicine

## 2012-09-06 ENCOUNTER — Ambulatory Visit (INDEPENDENT_AMBULATORY_CARE_PROVIDER_SITE_OTHER): Payer: Commercial Managed Care - PPO | Admitting: Internal Medicine

## 2012-09-06 VITALS — BP 160/80 | HR 83 | Temp 98.4°F | Wt 223.0 lb

## 2012-09-06 DIAGNOSIS — IMO0001 Reserved for inherently not codable concepts without codable children: Secondary | ICD-10-CM

## 2012-09-06 DIAGNOSIS — IMO0002 Reserved for concepts with insufficient information to code with codable children: Secondary | ICD-10-CM

## 2012-09-06 DIAGNOSIS — S86911A Strain of unspecified muscle(s) and tendon(s) at lower leg level, right leg, initial encounter: Secondary | ICD-10-CM

## 2012-09-06 NOTE — Assessment & Plan Note (Signed)
Discussed increasing insulin and better dietary compliance If not better next time, will refer to endocrine

## 2012-09-06 NOTE — Progress Notes (Signed)
Subjective:    Patient ID: Jamie Strickland, female    DOB: 1946/02/29, 67 y.o.   MRN: [id number]  HPI Has tried to be more careful with eating Weight is stable  Dropped piece of wood on right calf ~9 days ago Big bruise--has been icing it Didn't miss work Has been very sensitive Really got worse 4 days ago. Doesn't remember any new insult---just stood up and had bad pain along lateral right knee (while at work) Very tender at first but not as bad now Continues to elevate and ice Only taking aspirin-- 2 every 4 hours or so Warmth has resolved  Current Outpatient Prescriptions on File Prior to Visit  Medication Sig Dispense Refill  . ACCU-CHEK AVIVA PLUS test strip TEST BLOOD SUGAR TWICE DAILY  100 strip  11  . albuterol (VENTOLIN HFA) 108 (90 BASE) MCG/ACT inhaler Inhale 2 puffs four times daily as needed for asthma       . ALPRAZolam (XANAX) 0.5 MG tablet TAKE 1-2 TABLETS BY MOUTH EVERY NIGHT AT BEDTIME FOR SLEEP  60 tablet  0  . aspirin EC 81 MG tablet Take 81 mg by mouth daily.      Marland Kitchen atorvastatin (LIPITOR) 20 MG tablet Take 1 tablet (20 mg total) by mouth daily.  30 tablet  11  . Fluticasone-Salmeterol (ADVAIR DISKUS) 250-50 MCG/DOSE AEPB INHALE 1 PUFF BY MOUTH TWICE DAILY  60 each  3  . Insulin Syringe-Needle U-100 (INSULIN SYRINGE .5CC/30GX5/16") 30G X 5/16" 0.5 ML MISC USE AS DIRECTED  100 each  11  . lisinopril (PRINIVIL,ZESTRIL) 20 MG tablet TAKE 1 TABLET BY MOUTH EVERY DAY  30 tablet  PRN  . NOVOLIN 70/30 (70-30) 100 UNIT/ML injection INJECT 50 UNITS UNDER THE SKIN TWICE DAILY WITH A MEAL  10 mL  11  . NOVOLIN R 100 UNIT/ML injection INJECT 2-10 UNITS UNDER THE SKIN BEFORE MEALS BY SLIDING SCALE AS NEEDED  10 mL  0   No current facility-administered medications on file prior to visit.    Allergies  Allergen Reactions  . Codeine     REACTION: n /v    Past Medical History  Diagnosis Date  . Allergic rhinitis   . Hx of colonic polyps   . Diabetes mellitus type II   .  Hyperlipidemia   . Hypertension   . Osteoarthritis     Past Surgical History  Procedure Laterality Date  . Cholecystectomy    . Lasik    . Abdominal hysterectomy  1972    Cervical carcinoma in situ    Family History  Problem Relation Age of Onset  . Diabetes Mother   . Diabetes Father     brittle diabetes  . Asthma Father 57  . Liver disease Father     liver problems  . Heart disease Brother     CAD  . Cancer Maternal Aunt     Breast  . Heart disease Maternal Grandfather     CAD    History   Social History  . Marital Status: Divorced    Spouse Name: N/A    Number of Children: 2  . Years of Education: N/A   Occupational History  . LPN     on nights at [employer]   Social History Main Topics  . Smoking status: Former Games developer  . Smokeless tobacco: Former Neurosurgeon    Quit date: 08/09/2008  . Alcohol Use: Yes     Comment: Rare  . Drug Use: Not on file  .  Sexually Active: Not on file   Other Topics Concern  . Not on file   Social History Narrative  . No narrative on file   Review of Systems No foot or ankle swelling No fever    Objective:   Physical Exam  Constitutional: She appears well-developed and well-nourished. No distress.  Musculoskeletal:  Mild swelling lateral to right patella--no knee effusion. Knee is very thick Pain with flexion over 60 degrees Macmurray's is negative No obvious ligament laxity  Neurological:  Antalgic gait  Skin:  8cm circular contusion on medial upper right calf No inflammation Slight tenderness along lower part          Assessment & Plan:

## 2012-09-06 NOTE — Assessment & Plan Note (Signed)
May have been because of her abnormal gait since the bruise Discussed continued icing Tylenol  Doesn't tolerate stronger meds Ortho appt or Dr Patsy Lager if not a lot improved by next week

## 2012-09-13 ENCOUNTER — Other Ambulatory Visit: Payer: Self-pay | Admitting: Internal Medicine

## 2012-09-13 NOTE — Telephone Encounter (Signed)
rx called into pharmacy rx sent to pharmacy by e-script  

## 2012-09-13 NOTE — Telephone Encounter (Signed)
Okay to do the insulin for a year  Alprazolam #60 x 0

## 2012-10-04 ENCOUNTER — Other Ambulatory Visit: Payer: Self-pay | Admitting: Internal Medicine

## 2012-10-04 NOTE — Telephone Encounter (Signed)
This is a bit early since it was last filled May 6th Please find out what is going on

## 2012-10-04 NOTE — Telephone Encounter (Signed)
.  left message to have patient return my call.  

## 2012-10-04 NOTE — Telephone Encounter (Signed)
Spoke with patient about refill for xanax and she stated she has a lot going on and wouldn't go into detail but will call back 10/15/12 for a refill

## 2012-10-06 LAB — HM DIABETES EYE EXAM

## 2012-10-14 ENCOUNTER — Other Ambulatory Visit: Payer: Self-pay | Admitting: *Deleted

## 2012-10-14 ENCOUNTER — Encounter: Payer: Self-pay | Admitting: Radiology

## 2012-10-14 ENCOUNTER — Other Ambulatory Visit: Payer: Self-pay | Admitting: Internal Medicine

## 2012-10-14 MED ORDER — METFORMIN HCL 1000 MG PO TABS: 1000.0000 mg | ORAL_TABLET | Freq: Two times a day (BID) | ORAL | Status: DC

## 2012-10-14 NOTE — Telephone Encounter (Signed)
rx called into pharmacy

## 2012-10-14 NOTE — Telephone Encounter (Signed)
Okay #60 x 0 

## 2012-10-17 ENCOUNTER — Ambulatory Visit (INDEPENDENT_AMBULATORY_CARE_PROVIDER_SITE_OTHER): Payer: Commercial Managed Care - PPO | Admitting: Internal Medicine

## 2012-10-17 ENCOUNTER — Encounter: Payer: Self-pay | Admitting: Internal Medicine

## 2012-10-17 VITALS — BP 144/62 | HR 100 | Temp 98.1°F | Wt 225.0 lb

## 2012-10-17 DIAGNOSIS — IMO0001 Reserved for inherently not codable concepts without codable children: Secondary | ICD-10-CM

## 2012-10-17 DIAGNOSIS — J4520 Mild intermittent asthma, uncomplicated: Secondary | ICD-10-CM

## 2012-10-17 DIAGNOSIS — I10 Essential (primary) hypertension: Secondary | ICD-10-CM

## 2012-10-17 DIAGNOSIS — J45909 Unspecified asthma, uncomplicated: Secondary | ICD-10-CM

## 2012-10-17 DIAGNOSIS — E785 Hyperlipidemia, unspecified: Secondary | ICD-10-CM

## 2012-10-17 DIAGNOSIS — Z0000 Encounter for general adult medical examination without abnormal findings: Secondary | ICD-10-CM

## 2012-10-17 LAB — BASIC METABOLIC PANEL
CO2: 28 mEq/L (ref 19–32)
Chloride: 99 mEq/L (ref 96–112)
GFR: 65.86 mL/min (ref >60.00)
Glucose, Bld: 265 mg/dL — ABNORMAL HIGH (ref 70–99)
Potassium: 4.4 mEq/L (ref 3.5–5.1)
Sodium: 140 mEq/L (ref 135–145)

## 2012-10-17 LAB — HEPATIC FUNCTION PANEL
Bilirubin, Direct: 0 mg/dL (ref 0.0–0.3)
Total Bilirubin: 0.2 mg/dL — ABNORMAL LOW (ref 0.3–1.2)

## 2012-10-17 LAB — LDL CHOLESTEROL, DIRECT: Direct LDL: 58.4 mg/dL

## 2012-10-17 LAB — TSH: TSH: 0.83 u[IU]/mL (ref 0.35–5.50)

## 2012-10-17 LAB — CBC WITH DIFFERENTIAL/PLATELET
Basophils Absolute: 0 10*3/uL (ref 0.0–0.1)
HCT: 43 % (ref 36.0–46.0)
Hemoglobin: 14.1 g/dL (ref 12.0–15.0)
Lymphs Abs: 2.4 10*3/uL (ref 0.7–4.0)
MCHC: 32.7 g/dL (ref 30.0–36.0)
MCV: 90.8 fl (ref 78.0–100.0)
Monocytes Relative: 5.8 % (ref 3.0–12.0)
Neutro Abs: 6 10*3/uL (ref 1.4–7.7)
RDW: 13.4 % (ref 11.5–14.6)

## 2012-10-17 LAB — HM DIABETES FOOT EXAM

## 2012-10-17 NOTE — Assessment & Plan Note (Signed)
BP Readings from Last 3 Encounters:  10/17/12 144/62  09/06/12 160/80  02/29/12 138/78   Better and close to goal No changes

## 2012-10-17 NOTE — Assessment & Plan Note (Signed)
Has been quiet 

## 2012-10-17 NOTE — Assessment & Plan Note (Signed)
Doesn't seem to be better Will refer to Dr Elvera Lennox if still over 9% 1st AM appt (she works 3rd shift)

## 2012-10-17 NOTE — Assessment & Plan Note (Signed)
No problems with the med 

## 2012-10-17 NOTE — Patient Instructions (Signed)
Please reschedule your visit to set up the colonoscopy. You are due for your screening mammogram in October this year.

## 2012-10-17 NOTE — Progress Notes (Signed)
Subjective:    Patient ID: Jamie Strickland, female    DOB: 04/22/1946, 67 y.o.   MRN: [id number]  HPI Here for physical Reviewed advance directives  Discussed DM control---has been poor. She might be willing to see Dr Elvera Lennox if it can be first thing in AM Hasn't tolerated increased insulin dose  Had colonoscopy planned---then postponed Turned out it was just for the previsit Now has to reschedule  Due for mammogram in October  Current Outpatient Prescriptions on File Prior to Visit  Medication Sig Dispense Refill  . ACCU-CHEK AVIVA PLUS test strip TEST BLOOD SUGAR TWICE DAILY  100 strip  11  . albuterol (VENTOLIN HFA) 108 (90 BASE) MCG/ACT inhaler Inhale 2 puffs four times daily as needed for asthma       . ALPRAZolam (XANAX) 0.5 MG tablet TAKE 1 TO 2 TABLETS BY MOUTH EVERY NIGHT AT BEDTIME AS NEEDED FOR SLEEP  60 tablet  0  . aspirin EC 81 MG tablet Take 81 mg by mouth daily.      Marland Kitchen atorvastatin (LIPITOR) 20 MG tablet Take 1 tablet (20 mg total) by mouth daily.  30 tablet  11  . Fluticasone-Salmeterol (ADVAIR DISKUS) 250-50 MCG/DOSE AEPB INHALE 1 PUFF BY MOUTH TWICE DAILY  60 each  3  . Insulin Syringe-Needle U-100 (INSULIN SYRINGE .5CC/30GX5/16") 30G X 5/16" 0.5 ML MISC USE AS DIRECTED  100 each  11  . lisinopril (PRINIVIL,ZESTRIL) 20 MG tablet TAKE 1 TABLET BY MOUTH EVERY DAY  30 tablet  11  . metFORMIN (GLUCOPHAGE) 1000 MG tablet Take 1 tablet (1,000 mg total) by mouth 2 (two) times daily with a meal.  60 tablet  11  . NOVOLIN 70/30 (70-30) 100 UNIT/ML injection INJECT 50 UNITS UNDER THE SKIN TWICE DAILY WITH A MEAL  10 mL  11  . NOVOLIN R 100 UNIT/ML injection INJECT 2-10 UNITS UNDER THE SKIN BEFORE MEALS BY SLIDING SCALE AS NEEDED  10 mL  0   No current facility-administered medications on file prior to visit.    Allergies  Allergen Reactions  . Codeine     REACTION: n /v    Past Medical History  Diagnosis Date  . Allergic rhinitis   . Hx of colonic polyps   .  Diabetes mellitus type II   . Hyperlipidemia   . Hypertension   . Osteoarthritis     Past Surgical History  Procedure Laterality Date  . Cholecystectomy    . Lasik    . Abdominal hysterectomy  1972    Cervical carcinoma in situ  . Cataract extraction w/ intraocular lens  implant, bilateral Bilateral 2014    Family History  Problem Relation Age of Onset  . Diabetes Mother   . Diabetes Father     brittle diabetes  . Asthma Father 31  . Liver disease Father     liver problems  . Heart disease Brother     CAD  . Cancer Maternal Aunt     Breast  . Heart disease Maternal Grandfather     CAD    History   Social History  . Marital Status: Divorced    Spouse Name: N/A    Number of Children: 2  . Years of Education: N/A   Occupational History  . LPN     on nights at [employer]   Social History Main Topics  . Smoking status: Former Games developer  . Smokeless tobacco: Former Neurosurgeon    Quit date: 08/09/2008  . Alcohol Use: Yes  Comment: Rare  . Drug Use: Not on file  . Sexually Active: Not on file   Other Topics Concern  . Not on file   Social History Narrative   No living will   Son, Jamie Strickland, is health care POA   Discussed DNR and she wants this. Done 10/17/12   No tube feeds if cognitively unaware   Review of Systems  Constitutional: Negative for fatigue and unexpected weight change.       Wears seat belt  HENT: Negative for hearing loss, congestion, rhinorrhea, dental problem and tinnitus.   Eyes: Negative for visual disturbance.       Had cataracts removed---still nearsighted  Respiratory: Negative for cough, chest tightness, shortness of breath and wheezing.        Asthma quiet since quit smoking Hasn't needed the albuterol  Cardiovascular: Negative for chest pain, palpitations and leg swelling.  Gastrointestinal: Negative for nausea, vomiting, abdominal pain, constipation and blood in stool.       Occasional heartburn with peppers-- rarely uses tums  Endocrine:  Negative for cold intolerance and heat intolerance.  Genitourinary: Negative for dysuria, frequency, difficulty urinating and dyspareunia.  Musculoskeletal: Positive for arthralgias. Negative for back pain and joint swelling.       Hands cramp after embroidering--no meds  Skin: Negative for rash.       No suspicious lesions  Allergic/Immunologic: Positive for environmental allergies. Negative for immunocompromised state.       Mild seasonal allergies---generally doesn't use meds  Neurological: Negative for dizziness, syncope, weakness, light-headedness, numbness and headaches.  Hematological: Negative for adenopathy. Does not bruise/bleed easily.  Psychiatric/Behavioral: Negative for sleep disturbance and dysphoric mood. The patient is not nervous/anxious.        Uses alprazolam intermittently Used last night but not the night before       Objective:   Physical Exam  Constitutional: She is oriented to person, place, and time. She appears well-developed and well-nourished. No distress.  HENT:  Head: Normocephalic and atraumatic.  Right Ear: External ear normal.  Left Ear: External ear normal.  Mouth/Throat: Oropharynx is clear and moist. No oropharyngeal exudate.  Eyes: Conjunctivae and EOM are normal. Pupils are equal, round, and reactive to light.  Neck: Normal range of motion. Neck supple. No thyromegaly present.  Cardiovascular: Normal rate, regular rhythm, normal heart sounds and intact distal pulses.  Exam reveals no gallop.   No murmur heard. Pulmonary/Chest: Effort normal and breath sounds normal. No respiratory distress. She has no wheezes. She has no rales.  Abdominal: Soft. There is no tenderness.  Musculoskeletal: She exhibits no edema and no tenderness.  Lymphadenopathy:    She has no cervical adenopathy.  Neurological: She is alert and oriented to person, place, and time.  Normal sensation in feet  Skin: No rash noted. No erythema.  No foot lesions  Psychiatric: She  has a normal mood and affect. Her behavior is normal.          Assessment & Plan:

## 2012-10-17 NOTE — Assessment & Plan Note (Signed)
Overdue for colonoscopy She will reset the appt Mammogram due in October

## 2012-10-18 ENCOUNTER — Other Ambulatory Visit: Payer: Self-pay | Admitting: Internal Medicine

## 2012-10-18 DIAGNOSIS — IMO0001 Reserved for inherently not codable concepts without codable children: Secondary | ICD-10-CM

## 2012-10-26 ENCOUNTER — Ambulatory Visit: Payer: Commercial Managed Care - PPO | Admitting: Internal Medicine

## 2012-10-26 ENCOUNTER — Encounter: Payer: Self-pay | Admitting: Internal Medicine

## 2012-10-29 ENCOUNTER — Other Ambulatory Visit: Payer: Self-pay | Admitting: Internal Medicine

## 2012-11-14 ENCOUNTER — Other Ambulatory Visit: Payer: Self-pay | Admitting: Internal Medicine

## 2012-11-14 NOTE — Telephone Encounter (Signed)
rx called into pharmacy

## 2012-11-14 NOTE — Telephone Encounter (Signed)
Okay #60 x 0 

## 2012-11-30 ENCOUNTER — Encounter: Payer: Self-pay | Admitting: Internal Medicine

## 2012-11-30 ENCOUNTER — Ambulatory Visit (INDEPENDENT_AMBULATORY_CARE_PROVIDER_SITE_OTHER): Payer: Commercial Managed Care - PPO | Admitting: Internal Medicine

## 2012-11-30 VITALS — BP 144/74 | HR 88 | Temp 98.2°F | Ht 63.0 in | Wt 226.5 lb

## 2012-11-30 DIAGNOSIS — IMO0001 Reserved for inherently not codable concepts without codable children: Secondary | ICD-10-CM

## 2012-11-30 MED ORDER — INSULIN ASPART 100 UNIT/ML ~~LOC~~ SOLN: 15.0000 [IU] | Freq: Two times a day (BID) | SUBCUTANEOUS | Status: DC

## 2012-11-30 MED ORDER — INSULIN GLARGINE 100 UNIT/ML ~~LOC~~ SOLN: 45.0000 [IU] | Freq: Every day | SUBCUTANEOUS | Status: DC

## 2012-11-30 NOTE — Patient Instructions (Addendum)
Please return in 1 month with your sugar log.  Please stop 70/30 insulin. Start Lantus 45 units at bedtime (~10 am). Take Novolog 15 units 15 minutes for breakfast and dinner. For now, we can skip the insulin with lunch.  Please call me and let me know about your sugars in 1 week after you start the regimen.  PATIENT INSTRUCTIONS FOR TYPE 2 DIABETES:  **Please join MyChart!** - see attached instructions about how to join   DIET AND EXERCISE Diet and exercise is an important part of diabetic treatment.  We recommended aerobic exercise in the form of brisk walking (working between 40-60% of maximal aerobic capacity, similar to brisk walking) for 150 minutes per week (such as 30 minutes five days per week) along with 3 times per week performing 'resistance' training (using various gauge rubber tubes with handles) 5-10 exercises involving the major muscle groups (upper body, lower body and core) performing 10-15 repetitions (or near fatigue) each exercise. Start at half the above goal but build slowly to reach the above goals. If limited by weight, joint pain, or disability, we recommend daily walking in a swimming pool with water up to waist to reduce pressure from joints while allow for adequate exercise.    BLOOD GLUCOSES Monitoring your blood glucoses is important for continued management of your diabetes. Please check your blood glucoses 2-4 times a day: fasting, before meals and at bedtime (you can rotate these measurements - e.g. one day check before the 3 meals, the next day check before 2 of the meals and before bedtime, etc.   HYPOGLYCEMIA (low blood sugar) Hypoglycemia is usually a reaction to not eating, exercising, or taking too much insulin/ other diabetes drugs.  Symptoms include tremors, sweating, hunger, confusion, headache, etc. Treat IMMEDIATELY with 15 grams of Carbs:   4 glucose tablets    cup regular juice/soda   2 tablespoons raisins   4 teaspoons sugar   1 tablespoon  honey Recheck blood glucose in 15 mins and repeat above if still symptomatic/blood glucose <100. Please contact our office at [phone] if you have questions about how to next handle your insulin.  RECOMMENDATIONS TO REDUCE YOUR RISK OF DIABETIC COMPLICATIONS: * Take your prescribed MEDICATION(S). * Follow a DIABETIC diet: Complex carbs, fiber rich foods, heart healthy fish twice weekly, (monounsaturated and polyunsaturated) fats * AVOID saturated/trans fats, high fat foods, >2,300 mg salt per day. * EXERCISE at least 5 times a week for 30 minutes or preferably daily.  * DO NOT SMOKE OR DRINK more than 1 drink a day. * Check your FEET every day. Do not wear tightfitting shoes. Contact us if you develop an ulcer * See your EYE doctor once a year or more if needed * Get a FLU shot once a year * Get a PNEUMONIA vaccine once before and once after age 77 years  GOALS:  * Your Hemoglobin A1c of <7%  * fasting sugars need to be <130 * after meals sugars need to be <180 (2h after you start eating) * Your Systolic BP should be 140 or lower  * Your Diastolic BP should be 80 or lower  * Your HDL (Good Cholesterol) should be 40 or higher  * Your LDL (Bad Cholesterol) should be 100 or lower  * Your Triglycerides should be 150 or lower  * Your Urine microalbumin (kidney function) should be <30 * Your Body Mass Index should be 25 or lower   We will be glad to help you achieve  these goals. Our telephone number is: [phone].

## 2012-11-30 NOTE — Progress Notes (Signed)
Patient ID: Jamie Strickland, female   DOB: 07/30/1945, 67 y.o.   MRN: [id number]  HPI: Jamie Strickland is a 67 y.o.-year-old female, referred by her PCP, Dr.Letvak, for management of DM2, insulin-dependent, uncontrolled, without complications. She works third shift and she came directly from work. She appears in a hurry, checking her watch throughout the appointment.   Patient has been diagnosed with diabetes in 1998; she started on insulin 15 years ago. Last hemoglobin A1c was: Lab Results  Component Value Date   HGBA1C 9.2* 10/17/2012   previous values have been 10.4%, previously 9.1%.   Pt is on a regimen of: - Metformin 1000 mg po bid - Novolin 70/30 50 units bid - doses it without relationship with the meal, but most time eats something, then takes the insulin! Works from 10:30 to 7:15 am. Takes insulin at ~9 am and 9 pm.   - Novolin R. sliding scale - if CBG > 200-225: + 2u, 226-250: +4u.   Pt checks her sugars 2x a day and they are: - am: 110-120 (her bedtime - goes to back at 10 am) - pm: 100-180 (when wakes up) No lows. Lowest sugar was 34 x 1 - this can happen, but only when sick; she has hypoglycemia awareness at 100. Highest sugar was 250 when she was sick.   Pt's meals are: Snack: at 9 pm: low fat yoghurt 12 am - 4 am: lunch - takes it from home: green veggies  9 am: dinner: leftovers, eggs No potatoes, rice, bread, not a lot of refined sugars. No milk.   Working nights for 15 years. She retires in 1.5 years.  No exercise since she got a swollen right leg (she scraped it more than a month ago - improving).  Pt does not have chronic kidney disease, last BUN/creatinine was:  Lab Results  Component Value Date   BUN 21 10/17/2012   CREATININE 0.9 10/17/2012  Patient is on lisinopril 20 mg daily.  Last set of lipids: Lab Results  Component Value Date   CHOL 130 10/17/2012   HDL 34.20* 10/17/2012   LDLCALC 63 04/11/2012   LDLDIRECT 58.4 10/17/2012   TRIG 307.0* 10/17/2012   CHOLHDL 4  10/17/2012   Pt's last eye exam was in 10/2012. Had 2 cataract Sxs last month. No DR. Denies numbness and tingling in her legs. Last foot exam was performed by PCP on 10/17/2012.  I reviewed her chart and she also has a history of HL - on Lipitor 20, hypertriglycerides-last value of 307 on 10/17/2012 hypertension, osteoarthritis, obesity, allergic asthma. Last TSH was 0.83 on 10/17/2012.  Pt has FH of DM in mother and father and GMs.   ROS: Constitutional: no weight gain/loss, + fatigue, no subjective hyperthermia/hypothermia; + poor sleep Eyes: no blurry vision, no xerophthalmia ENT: no sore throat, no nodules palpated in throat, no dysphagia/odynophagia, no hoarseness Cardiovascular: no CP/SOB/palpitations/leg swelling Respiratory: + all: cough/SOB/wheezing Gastrointestinal: no N/V/D/C Musculoskeletal: no muscle/+ joint aches Skin: no rashes, + hair loss, easy bruising Neurological: no tremors/numbness/tingling/dizziness Psychiatric: no depression/anxiety  Past Medical History  Diagnosis Date  . Allergic rhinitis   . Hx of colonic polyps   . Diabetes mellitus type II   . Hyperlipidemia   . Hypertension   . Osteoarthritis    Past Surgical History  Procedure Laterality Date  . Cholecystectomy    . Lasik    . Abdominal hysterectomy  1972    Cervical carcinoma in situ  . Cataract extraction w/ intraocular lens  implant, bilateral Bilateral 2014   History   Social History  . Marital Status: Divorced    Spouse Name: N/A    Number of Children: 2  . Years of Education: N/A   Occupational History  . LPN     on nights at [employer]   Social History Main Topics  . Smoking status: Former Games developer  . Smokeless tobacco: Former Neurosurgeon    Quit date: 08/09/2008  . Alcohol Use: Yes     Comment: Rare  . Drug Use: No   Social History Narrative   No living will   Son, [name], is health care POA   Discussed DNR and she wants this. Done 10/17/12   No tube feeds if cognitively unaware    Current Outpatient Prescriptions on File Prior to Visit  Medication Sig Dispense Refill  . ACCU-CHEK AVIVA PLUS test strip TEST BLOOD SUGAR TWICE DAILY  100 strip  11  . albuterol (VENTOLIN HFA) 108 (90 BASE) MCG/ACT inhaler Inhale 2 puffs four times daily as needed for asthma       . ALPRAZolam (XANAX) 0.5 MG tablet TAKE 1 OR 2 TABLET(S) BY MOUTH EVERY NIGHT AT BEDTIME AS NEEDED FOR SLEEP  60 tablet  0  . aspirin EC 81 MG tablet Take 81 mg by mouth daily.      Marland Kitchen atorvastatin (LIPITOR) 20 MG tablet Take 1 tablet (20 mg total) by mouth daily.  30 tablet  11  . Insulin Syringe-Needle U-100 (INSULIN SYRINGE .5CC/30GX5/16") 30G X 5/16" 0.5 ML MISC USE AS DIRECTED  100 each  0  . lisinopril (PRINIVIL,ZESTRIL) 20 MG tablet TAKE 1 TABLET BY MOUTH EVERY DAY  30 tablet  11  . metFORMIN (GLUCOPHAGE) 1000 MG tablet Take 1 tablet (1,000 mg total) by mouth 2 (two) times daily with a meal.  60 tablet  11  . NOVOLIN 70/30 (70-30) 100 UNIT/ML injection INJECT 50 UNITS UNDER THE SKIN TWICE DAILY WITH A MEAL  10 mL  11  . NOVOLIN R 100 UNIT/ML injection INJECT 2-10 UNITS UNDER THE SKIN BEFORE MEALS BY SLIDING SCALE AS NEEDED  10 mL  0   No current facility-administered medications on file prior to visit.   Allergies  Allergen Reactions  . Codeine     REACTION: n /v   Family History  Problem Relation Age of Onset  . Diabetes Mother   . Diabetes Father     brittle diabetes  . Asthma Father 57  . Liver disease Father     liver problems  . Heart disease Brother     CAD  . Cancer Maternal Aunt     Breast  . Heart disease Maternal Grandfather     CAD   PE: BP 144/74  Pulse 88  Temp(Src) 98.2 F (36.8 C) (Oral)  Ht 5\' 3"  (1.6 m)  Wt 226 lb 8 oz (102.74 kg)  BMI 40.13 kg/m2 Wt Readings from Last 3 Encounters:  11/30/12 226 lb 8 oz (102.74 kg)  10/17/12 225 lb (102.059 kg)  09/06/12 223 lb (101.152 kg)   Constitutional: overweight, in NAD Eyes: PERRLA, EOMI, no exophthalmos ENT: moist  mucous membranes, no thyromegaly, no cervical lymphadenopathy Cardiovascular: RRR, No MRG Respiratory: CTA B Gastrointestinal: abdomen soft, NT, ND, BS+ Musculoskeletal: no deformities, strength intact in all 4 Skin: moist, warm, + rash/swelling medial right lower thigh, above knee - improving Neurological: no tremor with outstretched hands, DTR normal in all 4  ASSESSMENT: 1. DM2, insulin-dependent, uncontrolled, without complications  PLAN:  1.  Patient with long-standing diabetes, on a premixed insulin regimen for the last  >15 years, which is not working properly for her, since her hemoglobin A1c is 9.2%, previously 10.4%. She did not know what her hemoglobin A1c was in mentions that "she does not care about numbers".  - I explained what the target hemoglobin A1c should be, and why, and what is her last Hba1c  - We also discussed about her current regimen, which I don't think his ideal, since it's very inflexible. I explained the reasons why I considered that this is not a very good regimen: Inflexibility to adjust only the basal or the bolus doses and later peaking of the insulins. I explained that the ideal insulin regimen would contain ~ equal total daily doses of basal or bolus insulins. At this point, she takes 70 units of basal insulin (NPH) and 30 units of bolus (regular insulin). There is a large imbalance between the total daily doses, which I think is contributing to her hyperglycemia. The higher NPH dosing can contribute to lows during sleep or between meals.  - I suggested that she starts Lantus and NovoLog insulin, but she tells me that her sister took Lantus and she had a lot of problems with it, from hypoglycemia. I explained that this was probably caused by taking too much of the insulin rather than taking this particular type of insulin  - Pt does not appear enthusiastic at all about the change to basal-bolus or (it seems to me) to any change for that matter. She tells me she  will try to fill out the log if she does not forget. She actually left today before I had a chance to give her after visit summary, she told me that I can mail it to her.  - She tells me that if she is to fill a new prescription of Lantus or NovoLog, she will do this at the beginning of the month and I can call it in - we can try the following regimen - I did not have a chance to give it to her, but we mailed it to her home: Start Lantus 45 units at bedtime (~10 am). Take Novolog 15 units 15 minutes for breakfast and dinner. For now, we can skip the insulin with lunch.  Please call me and let me know about your sugars in 1 week after you start the regimen. - until she can fill the above Rx's, we'll keep her on the current regimen of metformin 1000 mg bid, and her Novolin 70/30 50 units before each meal. Since I am not sure whether she would go on with the Lantus-NovoLog regimen above, I let both regimens on the med list for now. - I stressed the importance of taking the insulin 30 minutes before the meal to avoid low blood sugars  - we discussed proper insulin injecting techniques (she was not holding the needle in for 10 sec after last insulin unit in) - given sugar log and advised how to fill it and to bring it at next appt -check 2-3 times a day, rotating checks - given foot care handout and explained the principles - given instructions for hypoglycemia management "15-15 rule" - will see her in 1 mo with her sugar log

## 2012-11-30 NOTE — Progress Notes (Signed)
After visit summary mailed to patients home address.

## 2012-12-23 ENCOUNTER — Other Ambulatory Visit: Payer: Self-pay | Admitting: Internal Medicine

## 2012-12-23 NOTE — Telephone Encounter (Signed)
Okay #60 x 0 

## 2012-12-23 NOTE — Telephone Encounter (Signed)
rx called into pharmacy

## 2013-01-04 ENCOUNTER — Ambulatory Visit: Payer: Commercial Managed Care - PPO | Admitting: Internal Medicine

## 2013-01-23 ENCOUNTER — Other Ambulatory Visit: Payer: Self-pay | Admitting: Internal Medicine

## 2013-01-24 ENCOUNTER — Other Ambulatory Visit: Payer: Self-pay

## 2013-01-24 MED ORDER — INSULIN NPH ISOPHANE & REGULAR (70-30) 100 UNIT/ML ~~LOC~~ SUSP: 50.0000 [IU] | Freq: Two times a day (BID) | SUBCUTANEOUS | Status: DC

## 2013-01-24 NOTE — Telephone Encounter (Signed)
Okay to change to the 3 vials a month with a year of refills

## 2013-01-24 NOTE — Telephone Encounter (Signed)
rx sent to pharmacy by e-script  

## 2013-01-24 NOTE — Telephone Encounter (Signed)
Pt said she picked up  Humulin 70/30 insulin today and only got 1 vial (this will last only 10 days). Pt request 3 vials which will last one month. Pt said she is not taking novolog or lantus insulin. Pt said she saw Dr Elvera Lennox and was told to ck BS 7 x a day for 30 days and to start lantus. Pt said she could not afford the lantus or pay for 210 strips which would be required to take BS 7 x a day. Pt said Dr Elvera Lennox would not listen to pt and pt does not plan to return to Dr Elvera Lennox. Pt is going on vacation 01/26/13 and wants Humulin 70/30 to take with her. Pt request cb. [facility] [address]Please advise.

## 2013-01-25 NOTE — Telephone Encounter (Signed)
Pt called back to see if refill done; advised done; [facility] said since pt got 1 vial yesterday too early to run thru;pt advised & pt will ck with [facility] about getting rx early due to traveling to [place].

## 2013-02-05 ENCOUNTER — Other Ambulatory Visit: Payer: Self-pay | Admitting: Internal Medicine

## 2013-03-11 HISTORY — PX: CATARACT EXTRACTION W/ INTRAOCULAR LENS  IMPLANT, BILATERAL: SHX1307

## 2013-03-17 ENCOUNTER — Other Ambulatory Visit: Payer: Self-pay | Admitting: Internal Medicine

## 2013-04-10 LAB — HM DIABETES EYE EXAM

## 2013-04-17 ENCOUNTER — Other Ambulatory Visit: Payer: Self-pay | Admitting: Internal Medicine

## 2013-04-17 ENCOUNTER — Ambulatory Visit: Payer: Commercial Managed Care - PPO | Admitting: Internal Medicine

## 2013-04-17 NOTE — Telephone Encounter (Addendum)
Received refill request electronically. Medication sheet shows as needed. Is it okay to refill medication? Last refill 6/13?

## 2013-04-18 NOTE — Telephone Encounter (Signed)
Okay to refill for a year 

## 2013-04-18 NOTE — Telephone Encounter (Signed)
Pt checking status of refill; advised spoke with Delice Bison and did receive refill; pt will pick up later today.

## 2013-04-21 ENCOUNTER — Encounter: Payer: Self-pay | Admitting: Internal Medicine

## 2013-04-21 ENCOUNTER — Ambulatory Visit (INDEPENDENT_AMBULATORY_CARE_PROVIDER_SITE_OTHER): Payer: Commercial Managed Care - PPO | Admitting: Internal Medicine

## 2013-04-21 VITALS — BP 150/80 | HR 85 | Temp 97.7°F | Wt 231.0 lb

## 2013-04-21 DIAGNOSIS — J4520 Mild intermittent asthma, uncomplicated: Secondary | ICD-10-CM

## 2013-04-21 DIAGNOSIS — I10 Essential (primary) hypertension: Secondary | ICD-10-CM

## 2013-04-21 DIAGNOSIS — IMO0001 Reserved for inherently not codable concepts without codable children: Secondary | ICD-10-CM

## 2013-04-21 DIAGNOSIS — J45909 Unspecified asthma, uncomplicated: Secondary | ICD-10-CM

## 2013-04-21 LAB — HEMOGLOBIN A1C: Hgb A1c MFr Bld: 10 % — ABNORMAL HIGH (ref 4.6–6.5)

## 2013-04-21 MED ORDER — GLIPIZIDE 5 MG PO TABS: 5.0000 mg | ORAL_TABLET | Freq: Every day | ORAL | Status: DC

## 2013-04-21 NOTE — Addendum Note (Signed)
Addended by: Desmond Dike on: 04/21/2013 03:58 PM   Modules accepted: Orders

## 2013-04-21 NOTE — Assessment & Plan Note (Signed)
Discussed lifestyle The complex insulin regimen is probably not going to work for her We can try low dose glipizide if A1c still over 9%

## 2013-04-21 NOTE — Progress Notes (Signed)
Pre-visit discussion using our clinic review tool. No additional management support is needed unless otherwise documented below in the visit note.  

## 2013-04-21 NOTE — Progress Notes (Signed)
Subjective:    Patient ID: Jamie Strickland, female    DOB: 1945-05-13, 67 y.o.   MRN: [id number]  HPI Just -from work Very busy--- has only had 1 aide recently  Reviewed visit with Dr Elvera Lennox Was excited about the increased testing and change in Rx Couldn't afford the change in Rx because she had already gotten it Sugars are about the same--spikes with carbs (though tries to limit) Not exercise  No chest pain No SOB Using the advair more regularly---- sensitive to the black mold at [facility] Still not using the albuterol much  Current Outpatient Prescriptions on File Prior to Visit  Medication Sig Dispense Refill  . ADVAIR DISKUS 250-50 MCG/DOSE AEPB INHALE 1 PUFF BY MOUTH TWICE DAILY  60 each  11  . albuterol (VENTOLIN HFA) 108 (90 BASE) MCG/ACT inhaler Inhale 2 puffs four times daily as needed for asthma       . aspirin EC 81 MG tablet Take 81 mg by mouth daily.      Marland Kitchen atorvastatin (LIPITOR) 20 MG tablet TAKE 1 TABLET BY MOUTH ONCE DAILY  30 tablet  11  . insulin NPH-regular (HUMULIN 70/30) (70-30) 100 UNIT/ML injection Inject 50 Units into the skin 2 (two) times daily with a meal.  100 mL  3  . Insulin Syringe-Needle U-100 (INSULIN SYRINGE .5CC/30GX5/16") 30G X 5/16" 0.5 ML MISC Use to inject insulin 3-4 times daily dx:250.02  300 each  1  . lisinopril (PRINIVIL,ZESTRIL) 20 MG tablet TAKE 1 TABLET BY MOUTH EVERY DAY  30 tablet  11  . metFORMIN (GLUCOPHAGE) 1000 MG tablet Take 1 tablet (1,000 mg total) by mouth 2 (two) times daily with a meal.  60 tablet  11  . NOVOLIN R 100 UNIT/ML injection INJECT 2 TO 10 UNITS UNDER THE SKIN BEFORE MEALS BY SLIDING SCALE AS NEEDED.  10 mL  0   No current facility-administered medications on file prior to visit.    Allergies  Allergen Reactions  . Codeine     REACTION: n /v    Past Medical History  Diagnosis Date  . Allergic rhinitis   . Hx of colonic polyps   . Diabetes mellitus type II   . Hyperlipidemia   . Hypertension   .  Osteoarthritis     Past Surgical History  Procedure Laterality Date  . Cholecystectomy    . Lasik    . Abdominal hysterectomy  1972    Cervical carcinoma in situ  . Cataract extraction w/ intraocular lens  implant, bilateral Bilateral 2014    Family History  Problem Relation Age of Onset  . Diabetes Mother   . Diabetes Father     brittle diabetes  . Asthma Father 19  . Liver disease Father     liver problems  . Heart disease Brother     CAD  . Cancer Maternal Aunt     Breast  . Heart disease Maternal Grandfather     CAD    History   Social History  . Marital Status: Divorced    Spouse Name: N/A    Number of Children: 2  . Years of Education: N/A   Occupational History  . LPN     on nights at [employer]   Social History Main Topics  . Smoking status: Former Games developer  . Smokeless tobacco: Former Neurosurgeon    Quit date: 08/09/2008  . Alcohol Use: Yes     Comment: Rare  . Drug Use: No  . Sexual  Activity: Not on file   Other Topics Concern  . Not on file   Social History Narrative   No living will   Son, [name], is health care POA   Discussed DNR and she wants this. Done 10/17/12   No tube feeds if cognitively unaware   Review of Systems Weight is about the same--more today due to weighing with coat Still not sleeping well--stopped her xanax    Objective:   Physical Exam  Constitutional: She appears well-developed and well-nourished. No distress.  Neck: Normal range of motion. Neck supple. No thyromegaly present.  Cardiovascular: Normal rate, regular rhythm, normal heart sounds and intact distal pulses.  Exam reveals no gallop.   No murmur heard. Pulmonary/Chest: Effort normal and breath sounds normal. No respiratory distress. She has no wheezes. She has no rales.  Abdominal: Soft. There is no tenderness.  Musculoskeletal: She exhibits no edema and no tenderness.  Lymphadenopathy:    She has no cervical adenopathy.  Skin:  No foot lesions  Psychiatric: She  has a normal mood and affect. Her behavior is normal.          Assessment & Plan:

## 2013-04-21 NOTE — Assessment & Plan Note (Signed)
BP Readings from Last 3 Encounters:  04/21/13 150/80  11/30/12 144/74  10/17/12 144/62   Not optimal She will try to do more exercise Consider increasing lisinopril

## 2013-04-21 NOTE — Assessment & Plan Note (Signed)
Using the advair more regularly due to mold exposure

## 2013-04-25 ENCOUNTER — Ambulatory Visit: Payer: Commercial Managed Care - PPO | Admitting: Internal Medicine

## 2013-07-21 ENCOUNTER — Other Ambulatory Visit: Payer: Self-pay | Admitting: Internal Medicine

## 2013-09-09 ENCOUNTER — Emergency Department: Payer: Self-pay | Admitting: Emergency Medicine

## 2013-11-09 ENCOUNTER — Other Ambulatory Visit: Payer: Self-pay | Admitting: Internal Medicine

## 2013-11-13 ENCOUNTER — Ambulatory Visit (INDEPENDENT_AMBULATORY_CARE_PROVIDER_SITE_OTHER): Payer: Commercial Managed Care - PPO | Admitting: Internal Medicine

## 2013-11-13 ENCOUNTER — Encounter: Payer: Self-pay | Admitting: Internal Medicine

## 2013-11-13 VITALS — BP 140/80 | HR 90 | Temp 98.1°F | Ht 63.0 in | Wt 227.0 lb

## 2013-11-13 DIAGNOSIS — Z1211 Encounter for screening for malignant neoplasm of colon: Secondary | ICD-10-CM

## 2013-11-13 DIAGNOSIS — E1165 Type 2 diabetes mellitus with hyperglycemia: Secondary | ICD-10-CM

## 2013-11-13 DIAGNOSIS — E785 Hyperlipidemia, unspecified: Secondary | ICD-10-CM

## 2013-11-13 DIAGNOSIS — J45909 Unspecified asthma, uncomplicated: Secondary | ICD-10-CM

## 2013-11-13 DIAGNOSIS — L570 Actinic keratosis: Secondary | ICD-10-CM

## 2013-11-13 DIAGNOSIS — I10 Essential (primary) hypertension: Secondary | ICD-10-CM

## 2013-11-13 DIAGNOSIS — J4520 Mild intermittent asthma, uncomplicated: Secondary | ICD-10-CM

## 2013-11-13 DIAGNOSIS — Z0000 Encounter for general adult medical examination without abnormal findings: Secondary | ICD-10-CM

## 2013-11-13 DIAGNOSIS — IMO0001 Reserved for inherently not codable concepts without codable children: Secondary | ICD-10-CM

## 2013-11-13 LAB — LIPID PANEL
CHOL/HDL RATIO: 4
Cholesterol: 153 mg/dL (ref 0–200)
HDL: 37.8 mg/dL — AB (ref >39.00)
LDL CALC: 65 mg/dL (ref 0–99)
NonHDL: 115.2
TRIGLYCERIDES: 251 mg/dL — AB (ref 0.0–149.0)
VLDL: 50.2 mg/dL — AB (ref 0.0–40.0)

## 2013-11-13 LAB — CBC WITH DIFFERENTIAL/PLATELET
BASOS PCT: 0.3 % (ref 0.0–3.0)
Basophils Absolute: 0 10*3/uL (ref 0.0–0.1)
Eosinophils Absolute: 0.3 10*3/uL (ref 0.0–0.7)
Eosinophils Relative: 2.5 % (ref 0.0–5.0)
HCT: 41.9 % (ref 36.0–46.0)
HEMOGLOBIN: 14.1 g/dL (ref 12.0–15.0)
Lymphocytes Relative: 22.9 % (ref 12.0–46.0)
Lymphs Abs: 2.3 10*3/uL (ref 0.7–4.0)
MCHC: 33.6 g/dL (ref 30.0–36.0)
MCV: 88.5 fl (ref 78.0–100.0)
MONOS PCT: 5.2 % (ref 3.0–12.0)
Monocytes Absolute: 0.5 10*3/uL (ref 0.1–1.0)
NEUTROS ABS: 7 10*3/uL (ref 1.4–7.7)
Neutrophils Relative %: 69.1 % (ref 43.0–77.0)
Platelets: 294 10*3/uL (ref 150.0–400.0)
RBC: 4.74 Mil/uL (ref 3.87–5.11)
RDW: 13.5 % (ref 11.5–15.5)
WBC: 10.2 10*3/uL (ref 4.0–10.5)

## 2013-11-13 LAB — COMPREHENSIVE METABOLIC PANEL
ALT: 34 U/L (ref 0–35)
AST: 25 U/L (ref 0–37)
Albumin: 4.1 g/dL (ref 3.5–5.2)
Alkaline Phosphatase: 70 U/L (ref 39–117)
BILIRUBIN TOTAL: 0.4 mg/dL (ref 0.2–1.2)
BUN: 25 mg/dL — AB (ref 6–23)
CALCIUM: 9.7 mg/dL (ref 8.4–10.5)
CHLORIDE: 101 meq/L (ref 96–112)
CO2: 28 mEq/L (ref 19–32)
Creatinine, Ser: 1 mg/dL (ref 0.4–1.2)
GFR: 57.55 mL/min — AB (ref >60.00)
Glucose, Bld: 217 mg/dL — ABNORMAL HIGH (ref 70–99)
Potassium: 4.4 mEq/L (ref 3.5–5.1)
Sodium: 141 mEq/L (ref 135–145)
Total Protein: 7.7 g/dL (ref 6.0–8.3)

## 2013-11-13 LAB — HM DIABETES FOOT EXAM

## 2013-11-13 LAB — T4, FREE: Free T4: 0.79 ng/dL (ref 0.60–1.60)

## 2013-11-13 LAB — HEMOGLOBIN A1C: Hgb A1c MFr Bld: 9.9 % — ABNORMAL HIGH (ref 4.6–6.5)

## 2013-11-13 NOTE — Patient Instructions (Addendum)
I recommend screening mammograms every 2 years.  Diabetes and Exercise Exercising regularly is important. It is not just about losing weight. It has many health benefits, such as:  Improving your overall fitness, flexibility, and endurance.  Increasing your bone density.  Helping with weight control.  Decreasing your body fat.  Increasing your muscle strength.  Reducing stress and tension.  Improving your overall health. People with diabetes who exercise gain additional benefits because exercise:  Reduces appetite.  Improves the body's use of blood sugar (glucose).  Helps lower or control blood glucose.  Decreases blood pressure.  Helps control blood lipids (such as cholesterol and triglycerides).  Improves the body's use of the hormone insulin by:  Increasing the body's insulin sensitivity.  Reducing the body's insulin needs.  Decreases the risk for heart disease because exercising:  Lowers cholesterol and triglycerides levels.  Increases the levels of good cholesterol (such as high-density lipoproteins [HDL]) in the body.  Lowers blood glucose levels. YOUR ACTIVITY PLAN  Choose an activity that you enjoy and set realistic goals. Your health care provider or diabetes educator can help you make an activity plan that works for you. You can break activities into 2 or 3 sessions throughout the day. Doing so is as good as one long session. Exercise ideas include:  Taking the dog for a walk.  Taking the stairs instead of the elevator.  Dancing to your favorite song.  Doing your favorite exercise with a friend. RECOMMENDATIONS FOR EXERCISING WITH TYPE 1 OR TYPE 2 DIABETES   Check your blood glucose before exercising. If blood glucose levels are greater than 240 mg/dL, check for urine ketones. Do not exercise if ketones are present.  Avoid injecting insulin into areas of the body that are going to be exercised. For example, avoid injecting insulin into:  The arms  when playing tennis.  The legs when jogging.  Keep a record of:  Food intake before and after you exercise.  Expected peak times of insulin action.  Blood glucose levels before and after you exercise.  The type and amount of exercise you have done.  Review your records with your health care provider. Your health care provider will help you to develop guidelines for adjusting food intake and insulin amounts before and after exercising.  If you take insulin or oral hypoglycemic agents, watch for signs and symptoms of hypoglycemia. They include:  Dizziness.  Shaking.  Sweating.  Chills.  Confusion.  Drink plenty of water while you exercise to prevent dehydration or heat stroke. Body water is lost during exercise and must be replaced.  Talk to your health care provider before starting an exercise program to make sure it is safe for you. Remember, almost any type of activity is better than none. Document Released: 07/18/2003 Document Revised: 12/28/2012 Document Reviewed: 10/04/2012 [facility] Patient Information 2015 [place], [place]. This information is not intended to replace advice given to you by your health care provider. Make sure you discuss any questions you have with your health care provider. DASH Eating Plan DASH stands for "Dietary Approaches to Stop Hypertension." The DASH eating plan is a healthy eating plan that has been shown to reduce high blood pressure (hypertension). Additional health benefits may include reducing the risk of type 2 diabetes mellitus, heart disease, and stroke. The DASH eating plan may also help with weight loss. WHAT DO I NEED TO KNOW ABOUT THE DASH EATING PLAN? For the DASH eating plan, you will follow these general guidelines:  Choose foods  with a percent daily value for sodium of less than 5% (as listed on the food label).  Use salt-free seasonings or herbs instead of table salt or sea salt.  Check with your health care provider or  pharmacist before using salt substitutes.  Eat lower-sodium products, often labeled as "lower sodium" or "no salt added."  Eat fresh foods.  Eat more vegetables, fruits, and low-fat dairy products.  Choose whole grains. Look for the word "whole" as the first word in the ingredient list.  Choose fish and skinless chicken or [place] more often than red meat. Limit fish, poultry, and meat to 6 oz (170 g) each day.  Limit sweets, desserts, sugars, and sugary drinks.  Choose heart-healthy fats.  Limit cheese to 1 oz (28 g) per day.  Eat more home-cooked food and less restaurant, buffet, and fast food.  Limit fried foods.  Cook foods using methods other than frying.  Limit canned vegetables. If you do use them, rinse them well to decrease the sodium.  When eating at a restaurant, ask that your food be prepared with less salt, or no salt if possible. WHAT FOODS CAN I EAT? Seek help from a dietitian for individual calorie needs. Grains Whole grain or whole wheat bread. Brown rice. Whole grain or whole wheat pasta. Quinoa, bulgur, and whole grain cereals. Low-sodium cereals. Corn or whole wheat flour tortillas. Whole grain cornbread. Whole grain crackers. Low-sodium crackers. Vegetables Fresh or frozen vegetables (raw, steamed, roasted, or grilled). Low-sodium or reduced-sodium tomato and vegetable juices. Low-sodium or reduced-sodium tomato sauce and paste. Low-sodium or reduced-sodium canned vegetables.  Fruits All fresh, canned (in natural juice), or frozen fruits. Meat and Other Protein Products Ground beef (85% or leaner), grass-fed beef, or beef trimmed of fat. Skinless chicken or [place]. Ground chicken or [place]. Pork trimmed of fat. All fish and seafood. Eggs. Dried beans, peas, or lentils. Unsalted nuts and seeds. Unsalted canned beans. Dairy Low-fat dairy products, such as skim or 1% milk, 2% or reduced-fat cheeses, low-fat ricotta or cottage cheese, or plain low-fat yogurt.  Low-sodium or reduced-sodium cheeses. Fats and Oils Tub margarines without trans fats. Light or reduced-fat mayonnaise and salad dressings (reduced sodium). Avocado. Safflower, olive, or canola oils. Natural peanut or almond butter. Other Unsalted popcorn and pretzels. The items listed above may not be a complete list of recommended foods or beverages. Contact your dietitian for more options. WHAT FOODS ARE NOT RECOMMENDED? Grains White bread. White pasta. White rice. Refined cornbread. Bagels and croissants. Crackers that contain trans fat. Vegetables Creamed or fried vegetables. Vegetables in a cheese sauce. Regular canned vegetables. Regular canned tomato sauce and paste. Regular tomato and vegetable juices. Fruits Dried fruits. Canned fruit in light or heavy syrup. Fruit juice. Meat and Other Protein Products Fatty cuts of meat. Ribs, chicken wings, bacon, sausage, bologna, salami, chitterlings, fatback, hot dogs, bratwurst, and packaged luncheon meats. Salted nuts and seeds. Canned beans with salt. Dairy Whole or 2% milk, cream, half-and-half, and cream cheese. Whole-fat or sweetened yogurt. Full-fat cheeses or blue cheese. Nondairy creamers and whipped toppings. Processed cheese, cheese spreads, or cheese curds. Condiments Onion and garlic salt, seasoned salt, table salt, and sea salt. Canned and packaged gravies. Worcestershire sauce. Tartar sauce. Barbecue sauce. Teriyaki sauce. Soy sauce, including reduced sodium. Steak sauce. Fish sauce. Oyster sauce. Cocktail sauce. Horseradish. Ketchup and mustard. Meat flavorings and tenderizers. Bouillon cubes. Hot sauce. Tabasco sauce. Marinades. Taco seasonings. Relishes. Fats and Oils Butter, stick margarine, lard, shortening, ghee, and  bacon fat. Coconut, palm kernel, or palm oils. Regular salad dressings. Other Pickles and olives. Salted popcorn and pretzels. The items listed above may not be a complete list of foods and beverages to avoid.  Contact your dietitian for more information. WHERE CAN I FIND MORE INFORMATION? National Heart, Lung, and Blood Institute: CablePromo.itwww.nhlbi.nih.gov/health/health-topics/topics/dash/ Document Released: 04/16/2011 Document Revised: 05/02/2013 Document Reviewed: 03/01/2013 [facility]Exit[facility] Patient Information 2015 [place] MountainExitCare, [place]LLC. This information is not intended to replace advice given to you by your health care provider. Make sure you discuss any questions you have with your health care provider.

## 2013-11-13 NOTE — Assessment & Plan Note (Signed)
BP Readings from Last 3 Encounters:  11/13/13 140/80  04/21/13 150/80  11/30/12 144/74   Acceptable control  Due for labs

## 2013-11-13 NOTE — Assessment & Plan Note (Signed)
No trouble with statin 

## 2013-11-13 NOTE — Assessment & Plan Note (Signed)
Hopefully better control on the glipizide

## 2013-11-13 NOTE — Assessment & Plan Note (Signed)
2 small lesions on forehead--- liquid nitrogen 30 seconds x 2 Discussed home care

## 2013-11-13 NOTE — Progress Notes (Signed)
Pre visit review using our clinic review tool, if applicable. No additional management support is needed unless otherwise documented below in the visit note. 

## 2013-11-13 NOTE — Assessment & Plan Note (Signed)
Agrees to do fecal immunoassay Mammogram every 2 years---thinks she had it last year Discussed fitness prevnar next visit--not a year since pneumovax
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
## 2013-11-13 NOTE — Assessment & Plan Note (Signed)
Increased symptoms in pollen season but still not bad

## 2013-11-14 ENCOUNTER — Telehealth: Payer: Self-pay | Admitting: Internal Medicine

## 2013-11-14 NOTE — Telephone Encounter (Signed)
Relevant patient education mailed to patient.  

## 2013-11-16 ENCOUNTER — Encounter: Payer: Self-pay | Admitting: *Deleted

## 2013-11-16 ENCOUNTER — Other Ambulatory Visit: Payer: Self-pay | Admitting: *Deleted

## 2013-11-16 MED ORDER — METFORMIN HCL 1000 MG PO TABS: 1000.0000 mg | ORAL_TABLET | Freq: Two times a day (BID) | ORAL | Status: DC

## 2013-11-16 MED ORDER — ATORVASTATIN CALCIUM 20 MG PO TABS: 20.0000 mg | ORAL_TABLET | Freq: Every day | ORAL | Status: DC

## 2013-11-16 MED ORDER — GLIPIZIDE 5 MG PO TABS: 5.0000 mg | ORAL_TABLET | Freq: Two times a day (BID) | ORAL | Status: DC

## 2013-11-18 ENCOUNTER — Other Ambulatory Visit: Payer: Self-pay | Admitting: Internal Medicine

## 2013-11-28 ENCOUNTER — Other Ambulatory Visit (INDEPENDENT_AMBULATORY_CARE_PROVIDER_SITE_OTHER): Payer: Commercial Managed Care - PPO

## 2013-11-28 ENCOUNTER — Encounter: Payer: Self-pay | Admitting: *Deleted

## 2013-11-28 DIAGNOSIS — Z1211 Encounter for screening for malignant neoplasm of colon: Secondary | ICD-10-CM

## 2013-11-28 LAB — FECAL OCCULT BLOOD, IMMUNOCHEMICAL: FECAL OCCULT BLD: NEGATIVE

## 2013-12-10 ENCOUNTER — Other Ambulatory Visit: Payer: Self-pay | Admitting: Internal Medicine

## 2014-02-05 ENCOUNTER — Other Ambulatory Visit: Payer: Self-pay | Admitting: Internal Medicine

## 2014-02-23 ENCOUNTER — Other Ambulatory Visit: Payer: Self-pay | Admitting: Internal Medicine

## 2014-03-08 ENCOUNTER — Other Ambulatory Visit: Payer: Self-pay | Admitting: Internal Medicine

## 2014-04-01 ENCOUNTER — Other Ambulatory Visit: Payer: Self-pay | Admitting: Internal Medicine

## 2014-04-02 ENCOUNTER — Other Ambulatory Visit: Payer: Self-pay | Admitting: Internal Medicine

## 2014-04-26 ENCOUNTER — Other Ambulatory Visit: Payer: Self-pay | Admitting: Internal Medicine

## 2014-05-18 ENCOUNTER — Telehealth: Payer: Self-pay | Admitting: Internal Medicine

## 2014-05-18 ENCOUNTER — Ambulatory Visit: Payer: Commercial Managed Care - PPO | Admitting: Internal Medicine

## 2014-05-18 NOTE — Telephone Encounter (Signed)
Patient did not come for their scheduled appointment today. For 6 month follow upPlease let me know if the patient needs to be contacted immediately for follow up or if no follow up is necessary.

## 2014-05-18 NOTE — Telephone Encounter (Signed)
She is due for a recheck on her diabetes She should be rescheduled within the next 1-2 months

## 2014-05-21 NOTE — Telephone Encounter (Signed)
Appointment made 1/18 pt aware

## 2014-05-28 ENCOUNTER — Encounter: Payer: Self-pay | Admitting: Internal Medicine

## 2014-05-28 ENCOUNTER — Other Ambulatory Visit: Payer: Self-pay | Admitting: Internal Medicine

## 2014-05-28 ENCOUNTER — Ambulatory Visit (INDEPENDENT_AMBULATORY_CARE_PROVIDER_SITE_OTHER): Payer: Commercial Managed Care - PPO | Admitting: Internal Medicine

## 2014-05-28 VITALS — BP 152/100 | HR 86 | Temp 97.6°F | Ht 63.0 in | Wt 220.8 lb

## 2014-05-28 DIAGNOSIS — E785 Hyperlipidemia, unspecified: Secondary | ICD-10-CM

## 2014-05-28 DIAGNOSIS — IMO0001 Reserved for inherently not codable concepts without codable children: Secondary | ICD-10-CM

## 2014-05-28 DIAGNOSIS — J4520 Mild intermittent asthma, uncomplicated: Secondary | ICD-10-CM

## 2014-05-28 DIAGNOSIS — E1165 Type 2 diabetes mellitus with hyperglycemia: Secondary | ICD-10-CM

## 2014-05-28 DIAGNOSIS — I10 Essential (primary) hypertension: Secondary | ICD-10-CM

## 2014-05-28 LAB — HEMOGLOBIN A1C: HEMOGLOBIN A1C: 10.4 % — AB (ref 4.6–6.5)

## 2014-05-28 MED ORDER — FLUTICASONE-SALMETEROL 250-50 MCG/DOSE IN AEPB: 1.0000 | INHALATION_SPRAY | Freq: Two times a day (BID) | RESPIRATORY_TRACT | Status: DC

## 2014-05-28 NOTE — Patient Instructions (Signed)
Please get your eye exam

## 2014-05-28 NOTE — Assessment & Plan Note (Signed)
No problems with statin Lab Results  Component Value Date   LDLCALC 65 11/13/2013

## 2014-05-28 NOTE — Progress Notes (Signed)
Subjective:    Patient ID: Jamie Strickland, female    DOB: 07/22/1945, 69 y.o.   MRN: [id number]  HPI  Here for follow up of diabetes and other chronic medical problems  Trying to be more careful with eating Not as hungry lately Just moved --- working hard over the past 2 weeks  Checks her sugars bid Does use regular coverage if high --- like if she has a piece of cake usuall 135-150 fasting No hypoglycemic reactions (only happens with illnesses) Overdue for eye exam  Got infection in right big toe Nail fell off and infection is better (just soaked it with medication from pharmacist)  No chest pain No SOB Does note congestion and mucus in [place]--thinks there is mold there No syncope  No problems with atorvastatin No muscle aches or GI problems  Current Outpatient Prescriptions on File Prior to Visit  Medication Sig Dispense Refill  . ACCU-CHEK SMARTVIEW test strip TEST BLOOD SUGAR TWICE DAILY AS NEEDED 100 each 3  . albuterol (VENTOLIN HFA) 108 (90 BASE) MCG/ACT inhaler Inhale 2 puffs four times daily as needed for asthma     . aspirin EC 81 MG tablet Take 81 mg by mouth daily.    Marland Kitchen. atorvastatin (LIPITOR) 20 MG tablet TAKE 1 TABLET BY MOUTH EVERY DAY 90 tablet 0  . glipiZIDE (GLUCOTROL) 5 MG tablet TAKE 1 TABLET BY MOUTH EVERY DAY AT BREAKFAST 90 tablet 0  . HUMULIN 70/30 (70-30) 100 UNIT/ML injection INJECT 50 UNITS INTO THE SKIN TWICE DAILY WITH A MEAL 100 mL 3  . Insulin Syringe-Needle U-100 (INSULIN SYRINGE .5CC/30GX5/16") 30G X 5/16" 0.5 ML MISC Use to inject insulin 3-4 times daily dx:250.02 300 each 1  . lisinopril (PRINIVIL,ZESTRIL) 20 MG tablet TAKE 1 TABLET BY MOUTH EVERY DAY 90 tablet 1  . metFORMIN (GLUCOPHAGE) 1000 MG tablet TAKE 1 TABLET BY MOUTH TWICE DAILY WITH A MEAL 60 tablet 0  . NOVOLIN R 100 UNIT/ML injection INJECT 2 TO 10 UNITS UNDER THE SKIN BEFORE MEALS BY SLIDING SCALE AS NEEDED. 10 mL 0   No current facility-administered medications on file  prior to visit.    Allergies  Allergen Reactions  . Codeine     REACTION: n /v    Past Medical History  Diagnosis Date  . Allergic rhinitis   . Hx of colonic polyps   . Diabetes mellitus type II   . Hyperlipidemia   . Hypertension   . Osteoarthritis     Past Surgical History  Procedure Laterality Date  . Cholecystectomy    . Lasik    . Abdominal hysterectomy  1972    Cervical carcinoma in situ  . Cataract extraction w/ intraocular lens  implant, bilateral Bilateral 2014  . Cataract extraction w/ intraocular lens  implant, bilateral  11/14    Family History  Problem Relation Age of Onset  . Diabetes Mother   . Diabetes Father     brittle diabetes  . Asthma Father [id number]  . Liver disease Father     liver problems  . Heart disease Brother     CAD  . Cancer Maternal Aunt     Breast  . Heart disease Maternal Grandfather     CAD    History   Social History  . Marital Status: Divorced    Spouse Name: N/A    Number of Children: 2  . Years of Education: N/A   Occupational History  . LPN     on  nights at [employer]   Social History Main Topics  . Smoking status: Former Games developer  . Smokeless tobacco: Former Neurosurgeon    Quit date: 08/09/2008  . Alcohol Use: Yes     Comment: Rare  . Drug Use: No  . Sexual Activity: Not on file   Other Topics Concern  . Not on file   Social History Narrative   No living will   Son, [name], is health care POA   Discussed DNR and she wants this. Done 10/17/12   No tube feeds if cognitively unaware   Review of Systems  Sleep is variable---still trouble due to 3rd shift Considering retiring before too long Weight is down a few pounds     Objective:   Physical Exam  Constitutional: She appears well-developed and well-nourished. No distress.  Neck: Normal range of motion. Neck supple. No thyromegaly present.  Cardiovascular: Normal rate, regular rhythm, normal heart sounds and intact distal pulses.  Exam reveals no gallop.   No  murmur heard. Pulmonary/Chest: Effort normal and breath sounds normal. No respiratory distress. She has no wheezes. She has no rales.  Musculoskeletal: She exhibits no edema or tenderness.  Lymphadenopathy:    She has no cervical adenopathy.  Neurological:  Normal sensation in feet  Skin:  Slight callous on medial left foot No ulcers  Psychiatric: She has a normal mood and affect. Her behavior is normal.          Assessment & Plan:

## 2014-05-28 NOTE — Progress Notes (Signed)
Pre visit review using our clinic review tool, if applicable. No additional management support is needed unless otherwise documented below in the visit note. 

## 2014-05-28 NOTE — Assessment & Plan Note (Signed)
No regular cough Some occ SOB---relates to mold Uses albuterol only at [facility] Will refill the advair

## 2014-05-28 NOTE — Assessment & Plan Note (Signed)
Hopefully better control Will recheck Consider increasing the insulin
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
## 2014-06-22 ENCOUNTER — Other Ambulatory Visit: Payer: Self-pay | Admitting: Internal Medicine

## 2014-06-26 ENCOUNTER — Telehealth: Payer: Self-pay | Admitting: Internal Medicine

## 2014-06-26 DIAGNOSIS — IMO0001 Reserved for inherently not codable concepts without codable children: Secondary | ICD-10-CM

## 2014-06-26 DIAGNOSIS — E1165 Type 2 diabetes mellitus with hyperglycemia: Principal | ICD-10-CM

## 2014-06-26 NOTE — Telephone Encounter (Signed)
Referral placed.

## 2014-06-26 NOTE — Telephone Encounter (Signed)
Pt called requesting referral to endocrinology. Dr. Welford RochePhadke at [facility]  Pt prefers Monday mornings. If not available any day of the week is fine, still morning appt due to her working 3rd shift.  [phone]

## 2014-06-27 ENCOUNTER — Telehealth: Payer: Self-pay | Admitting: Internal Medicine

## 2014-06-27 DIAGNOSIS — Z1211 Encounter for screening for malignant neoplasm of colon: Secondary | ICD-10-CM

## 2014-06-27 NOTE — Telephone Encounter (Signed)
Referral faxed to Dr. Bluford Kaufmannh. Pt aware their office will call and schedule.

## 2014-06-27 NOTE — Telephone Encounter (Signed)
Pt called needing to get a referral to dr [facility] The insurance company wants her to go get a preventative colonscopy She tried making appointment needs referral per [facility]  Pt has [employer]  Insurance We have copy of ins card  Pt would like appointment early am Pt would you to leave date and time on answering machine

## 2014-06-27 NOTE — Telephone Encounter (Signed)
Referral made 

## 2014-07-06 ENCOUNTER — Encounter: Payer: Self-pay | Admitting: Internal Medicine

## 2014-07-09 ENCOUNTER — Telehealth: Payer: Self-pay

## 2014-07-09 NOTE — Telephone Encounter (Signed)
-----   Message from Karie Schwalbeichard I Letvak, MD sent at 07/07/2014  8:01 AM EST ----- Please notify Your mammogram looks fine I recommend a repeat in about 2 years

## 2014-07-09 NOTE — Telephone Encounter (Signed)
Patient informed of mammogram results and recommendations.  

## 2014-07-12 ENCOUNTER — Encounter (INDEPENDENT_AMBULATORY_CARE_PROVIDER_SITE_OTHER): Payer: Self-pay

## 2014-07-12 ENCOUNTER — Other Ambulatory Visit: Payer: Self-pay | Admitting: Endocrinology

## 2014-07-12 ENCOUNTER — Ambulatory Visit (INDEPENDENT_AMBULATORY_CARE_PROVIDER_SITE_OTHER): Payer: Commercial Managed Care - PPO | Admitting: Endocrinology

## 2014-07-12 ENCOUNTER — Encounter: Payer: Self-pay | Admitting: Endocrinology

## 2014-07-12 VITALS — BP 148/88 | HR 85 | Resp 14 | Ht 63.0 in | Wt 224.2 lb

## 2014-07-12 DIAGNOSIS — E669 Obesity, unspecified: Secondary | ICD-10-CM

## 2014-07-12 DIAGNOSIS — E1165 Type 2 diabetes mellitus with hyperglycemia: Secondary | ICD-10-CM

## 2014-07-12 DIAGNOSIS — E785 Hyperlipidemia, unspecified: Secondary | ICD-10-CM

## 2014-07-12 DIAGNOSIS — I10 Essential (primary) hypertension: Secondary | ICD-10-CM

## 2014-07-12 DIAGNOSIS — IMO0001 Reserved for inherently not codable concepts without codable children: Secondary | ICD-10-CM

## 2014-07-12 LAB — MICROALBUMIN / CREATININE URINE RATIO
CREATININE, U: 60.4 mg/dL
MICROALB/CREAT RATIO: 4 mg/g (ref 0.0–30.0)
Microalb, Ur: 2.4 mg/dL — ABNORMAL HIGH (ref 0.0–1.9)

## 2014-07-12 LAB — COMPREHENSIVE METABOLIC PANEL
ALBUMIN: 4.1 g/dL (ref 3.5–5.2)
ALK PHOS: 83 U/L (ref 39–117)
ALT: 40 U/L — AB (ref 0–35)
AST: 22 U/L (ref 0–37)
BUN: 24 mg/dL — AB (ref 6–23)
CO2: 33 mEq/L — ABNORMAL HIGH (ref 19–32)
CREATININE: 0.81 mg/dL (ref 0.40–1.20)
Calcium: 9.5 mg/dL (ref 8.4–10.5)
Chloride: 100 mEq/L (ref 96–112)
GFR: 74.93 mL/min (ref >60.00)
Glucose, Bld: 401 mg/dL — ABNORMAL HIGH (ref 70–99)
Potassium: 4.4 mEq/L (ref 3.5–5.1)
Sodium: 137 mEq/L (ref 135–145)
Total Bilirubin: 0.3 mg/dL (ref 0.2–1.2)
Total Protein: 7.2 g/dL (ref 6.0–8.3)

## 2014-07-12 LAB — HM DIABETES FOOT EXAM: HM DIABETIC FOOT EXAM: NORMAL

## 2014-07-12 MED ORDER — CANAGLIFLOZIN 100 MG PO TABS: 100.0000 mg | ORAL_TABLET | Freq: Every day | ORAL | Status: DC

## 2014-07-12 NOTE — Assessment & Plan Note (Signed)
Bp not at target today. Reevaluate at next visit.  Update urine MA.

## 2014-07-12 NOTE — Assessment & Plan Note (Signed)
Discussed need to control portion sizes and suggested adding a walk. She is reluctant to exercise , as doesn't have the time to do. She will work on her diet. Hopefully, with introduction of SGLT2 her weight will be better

## 2014-07-12 NOTE — Assessment & Plan Note (Signed)
Recent A1c not at target. Discussed need to check sugars prior to meals and at bedtime atleast to monitor therapy.  She is agreeable to checking sugars 4 x daily.  We discussed about foot care, eye exams.  Discussed various medication regimens including GLP-1, SGLT2, basal/bolus MDI.   She has elected to try SGLT2 at this time. Will test CMP today and assess if GFR is okay for this medication. If so, will plan to start Invokana 100 mg daily. Aware of risks.  Change Humulin 70/30 to 54 units with Bf (9pm) and continue 50 units with supper ( 8am). Stop regular insulin. Continue current metformin and glipizide. Will try to wean off SU in the next few visits.

## 2014-07-12 NOTE — Assessment & Plan Note (Signed)
Last LDL at target on current therapy. TGs are elevated, expect improvement with better sugars.   

## 2014-07-12 NOTE — Patient Instructions (Signed)
Check sugars 4 x daily ( before each meal and at bedtime).  Record them in a log book and bring that/meter to next appointment.   Continue current metformin, Glipizide Change Humulin 70/30 to 54 units with Breakfast and 50 units with supper.  Stop regular insulin.  Labs today. If kidney function allows, then will start Invokana at 100 mg daily with Breakfast(9pm). Remember to stay hydrated while on this medication. If develop yeast infection/UTI then visit PCP.   Please come back for a follow-up appointment in 2 weeks

## 2014-07-12 NOTE — Progress Notes (Signed)
Reason for visit-  Jamie Strickland is a 69 y.o.-year-old female, referred by her PCP,  Karie Schwalbe, MD for management of Type 2 diabetes, uncontrolled, without complications (?developing Stage 2/3 CKD). Last visit was July 2014 with Dr Elvera Lennox.   HPI- Patient has been diagnosed with diabetes in ~2000. Recalls being initially on lifestyle modifications.  Tried  Metformin, Glipizide in the past.  she has  been on insulin since about 10+ years.   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    Last hemoglobin A1c was: Lab Results  Component Value Date   HGBA1C 10.4* 05/28/2014   HGBA1C 9.9* 11/13/2013   HGBA1C 10.0* 04/21/2013     Pt checks her sugars "when she thinks about it" in  a day . Uses accuChek nano glucometer. By meter review they are:  PREMEAL Breakfast ( 9pm) Lunch (4am) Dinner( 8am) Bedtime Overall  Glucose range: 228-305 162-487 192    Mean/median:        POST-MEAL PC Breakfast PC Lunch PC Dinner  Glucose range:     Mean/median:       Hypoglycemia-  No lows. Lowest sugar was n/a; she has hypoglycemia awareness at 70.   Dietary habits- eats 2-three times daily. Desserts weak point. Has a lot of cakes at work. No sodas. Occasionally doesn't have time to eat lunch at work.   Exercise- no time to exercise  Weight -  Wt Readings from Last 3 Encounters:  07/12/14 224 lb 4 oz (101.719 kg)  05/28/14 220 lb 12.8 oz (100.154 kg)  11/13/13 227 lb (102.967 kg)    Diabetes Complications-  Nephropathy-  Yes, ? 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 No known neuropathy.  Associated history - No CAD . No prior stroke. No hypothyroidism. her last TSH was  Lab Results  Component Value Date   TSH 0.83 10/17/2012    Hyperlipidemia-  her last set of lipids were- Currently on Lipitor 20 mg. Tolerating well.   Lab Results  Component Value Date   CHOL 153 11/13/2013   HDL 37.80* 11/13/2013   LDLCALC 65 11/13/2013   LDLDIRECT 58.4 10/17/2012   TRIG 251.0* 11/13/2013   CHOLHDL 4 11/13/2013    Blood Pressure/HTN- Patient's blood pressure is not well controlled today on current regimen that includes ACE-I ( Lisinopril)  Pt has FH of DM in parents.  I have reviewed the patient's past medical history, family and social history, surgical history, medications and allergies.  Past Medical History  Diagnosis Date  . Allergic rhinitis   . Hx of colonic polyps   . Diabetes mellitus type II   .  Hyperlipidemia   . Hypertension   . Osteoarthritis    Past Surgical History  Procedure Laterality Date  . Cholecystectomy    . Lasik    . Abdominal hysterectomy  1972    Cervical carcinoma in situ  . Cataract extraction w/ intraocular lens  implant, bilateral Bilateral 2014  . Cataract extraction w/ intraocular lens  implant, bilateral  11/14   Family History  Problem Relation Age of Onset  . Diabetes Mother   . Heart disease Mother   . Diabetes Father     brittle diabetes  . Asthma Father 69  . Liver disease Father     liver problems  . Heart disease Father   . Heart disease Brother     CAD  . Cancer Maternal Aunt     Breast  . Heart disease Maternal Grandfather     CAD   History    Social History  . Marital Status: Divorced    Spouse Name: N/A  . Number of Children: 2  . Years of Education: N/A   Occupational History  . LPN     on nights at [employer]   Social History Main Topics  . Smoking status: Former Games developer  . Smokeless tobacco: Former Neurosurgeon    Quit date: 08/09/2008  . Alcohol Use: Yes     Comment: Rare  . Drug Use: No  . Sexual Activity: Not on file   Other Topics Concern  . Not on file   Social History Narrative   No living will   Son, [name], is health care POA   Discussed DNR and she wants this. Done 10/17/12   No tube feeds if cognitively unaware   Current Outpatient Prescriptions on File Prior to Visit  Medication Sig Dispense Refill  . ACCU-CHEK SMARTVIEW test strip TEST BLOOD SUGAR TWICE DAILY AS NEEDED 100 each 3  . albuterol (VENTOLIN HFA) 108 (90 BASE) MCG/ACT inhaler Inhale 2 puffs four times daily as needed for asthma     . aspirin EC 81 MG tablet Take 81 mg by mouth daily.    Marland Kitchen atorvastatin (LIPITOR) 20 MG tablet TAKE 1 TABLET BY MOUTH EVERY DAY 90 tablet 0  . Fluticasone-Salmeterol (ADVAIR DISKUS) 250-50 MCG/DOSE AEPB Inhale 1 puff into the lungs 2 (two) times daily. 60 each 11  . glipiZIDE (GLUCOTROL) 5 MG tablet TAKE 1 TABLET BY MOUTH EVERY DAY AT BREAKFAST 90 tablet 0  . HUMULIN 70/30 (70-30) 100 UNIT/ML injection INJECT 50 UNITS INTO THE SKIN TWICE DAILY WITH A MEAL 100 mL 3  . Insulin Syringe-Needle U-100 (INSULIN SYRINGE .5CC/30GX5/16") 30G X 5/16" 0.5 ML MISC USE 3-4 TIMES DAILY 300 each 6  . lisinopril (PRINIVIL,ZESTRIL) 20 MG tablet TAKE 1 TABLET BY MOUTH EVERY DAY 90 tablet 1  . metFORMIN (GLUCOPHAGE) 1000 MG tablet TAKE 1 TABLET BY MOUTH TWICE DAILY WITH A MEAL 60 tablet 0   No current facility-administered medications on file prior to visit.   Allergies  Allergen Reactions  . Codeine     REACTION: n /v     Review of Systems: [x]  complains of  [  ] denies General:   [  ] Recent weight change [ x ] Fatigue  [  ]  Loss of appetite Eyes: [  ]  Vision Difficulty [  ]  Eye pain ENT: [  ]  Hearing difficulty [  ]  Difficulty Swallowing CVS: [  ] Chest pain [  ]  Palpitations/Irregular Heart beat [  ]  Shortness of breath lying flat [  ] Swelling of legs Resp: [  ] Frequent Cough [  ] Shortness of Breath  [  ]  Wheezing GI: [  ] Heartburn  [  ] Nausea or Vomiting  [  ] Diarrhea [  ] Constipation  [  ] Abdominal Pain GU: [  ]  Polyuria  [ x ]  nocturia Bones/joints:  [  ]  Muscle aches  [  ] Joint Pain  [  ] Bone pain Skin/Hair/Nails: [  ]  Rash  [  ] New stretch marks [  ]  Itching [x  ] Hair loss [  ]  Excessive hair growth Reproduction: [  ] Low sexual desire , [  ]  Women: Menstrual cycle problems [  ]  Women: Breast Discharge [  ] Men: Difficulty with erections [  ]  Men: Enlarged Breasts CNS: [  ] Frequent Headaches [  ] Blurry vision [  ] Tremors [  ] Seizures [  ] Loss of consciousness [  ] Localized weakness Endocrine: [ x ]  Excess thirst [ x ]  Feeling excessively hot [  ]  Feeling excessively cold Heme: [x  ]  Easy bruising [  ]  Enlarged glands or lumps in neck Allergy: [ x ]  Food allergies [ x ] Environmental allergies  PE: BP 148/88 mmHg  Pulse 85  Resp 14  Ht  (1.6 m)  Wt 224 lb 4 oz (101.719 kg)  BMI 39.73 kg/m2  SpO2 97% Wt Readings from Last 3 Encounters:  07/12/14 224 lb 4 oz (101.719 kg)  05/28/14 220 lb 12.8 oz (100.154 kg)  11/13/13 227 lb (102.967 kg)   GENERAL: No acute distress, well developed HEENT:  Eye exam shows normal external appearance. Oral exam shows normal mucosa .  NECK:   Neck exam shows no lymphadenopathy. No Carotids bruits. Thyroid is not enlarged and no nodules felt.  no acanthosis nigricans LUNGS:         Chest is symmetrical. Lungs are clear to auscultation.Marland Kitchen   HEART:         Heart sounds:  S1 and S2 are normal. ? Systolic murmurs  heard. ABDOMEN:  No Distention present. Liver and spleen are not palpable. No other mass or tenderness present.   EXTREMITIES:     There is no edema. 2+ DP pulses  NEUROLOGICAL:     Grossly intact.            Diabetic foot exam done with shoes and socks removed: Normal Monofilament testing bilaterally. No deformities of toes.  Bunion near left great toe, Nails  Not dystrophic. Skin normal color. No open wounds.  Few callosities MUSCULOSKELETAL:       There is no enlargement or gross deformity of the joints.  SKIN:       No rash  ASSESSMENT AND PLAN: Problem List Items Addressed This Visit      Cardiovascular and Mediastinum   Essential hypertension, benign    Bp not at target today. Reevaluate at next visit.  Update urine MA.       Relevant Orders   Comprehensive metabolic panel   Microalbumin / creatinine urine ratio     Other   Hyperlipemia    Last LDL at target on current therapy. TGs are elevated, expect improvement with better sugars.      Relevant Orders   Comprehensive metabolic panel   Microalbumin / creatinine urine ratio  Diabetes mellitus type 2, uncontrolled, without complications - Primary    Recent A1c not at target. Discussed need to check sugars prior to meals and at bedtime atleast to monitor therapy.  She is agreeable to checking sugars 4 x daily.  We discussed about foot care, eye exams.  Discussed various medication regimens including GLP-1, SGLT2, basal/bolus MDI.   She has elected to try SGLT2 at this time. Will test CMP today and assess if GFR is okay for this medication. If so, will plan to start Invokana 100 mg daily. Aware of risks.  Change Humulin 70/30 to 54 units with Bf (9pm) and continue 50 units with supper ( 8am). Stop regular insulin. Continue current metformin and glipizide. Will try to wean off SU in the next few visits.         Relevant Orders   Comprehensive metabolic panel   Microalbumin / creatinine urine ratio   Obesity    Discussed need to control portion sizes and suggested adding a walk. She is reluctant to exercise , as doesn't have the  time to do. She will work on her diet. Hopefully, with introduction of SGLT2 her weight will be better      Relevant Orders   Comprehensive metabolic panel   Microalbumin / creatinine urine ratio        - Return to clinic in  2 weeks with sugar log/meter.  Xzaiver Vayda [facility] 07/12/2014 1:23 PM

## 2014-07-13 ENCOUNTER — Encounter: Payer: Self-pay | Admitting: *Deleted

## 2014-08-02 ENCOUNTER — Encounter: Payer: Self-pay | Admitting: Endocrinology

## 2014-08-02 ENCOUNTER — Ambulatory Visit (INDEPENDENT_AMBULATORY_CARE_PROVIDER_SITE_OTHER): Payer: Commercial Managed Care - PPO | Admitting: Endocrinology

## 2014-08-02 VITALS — BP 146/82 | HR 84 | Resp 14 | Ht 63.0 in | Wt 221.0 lb

## 2014-08-02 DIAGNOSIS — E669 Obesity, unspecified: Secondary | ICD-10-CM

## 2014-08-02 DIAGNOSIS — I10 Essential (primary) hypertension: Secondary | ICD-10-CM | POA: Diagnosis not present

## 2014-08-02 DIAGNOSIS — E1165 Type 2 diabetes mellitus with hyperglycemia: Secondary | ICD-10-CM | POA: Diagnosis not present

## 2014-08-02 DIAGNOSIS — IMO0001 Reserved for inherently not codable concepts without codable children: Secondary | ICD-10-CM

## 2014-08-02 LAB — BASIC METABOLIC PANEL
BUN: 22 mg/dL (ref 6–23)
CHLORIDE: 102 meq/L (ref 96–112)
CO2: 30 mEq/L (ref 19–32)
Calcium: 9.6 mg/dL (ref 8.4–10.5)
Creatinine, Ser: 0.91 mg/dL (ref 0.40–1.20)
GFR: 65.5 mL/min (ref >60.00)
Glucose, Bld: 279 mg/dL — ABNORMAL HIGH (ref 70–99)
Potassium: 4.9 mEq/L (ref 3.5–5.1)
Sodium: 138 mEq/L (ref 135–145)

## 2014-08-02 NOTE — Progress Notes (Signed)
Reason for visit-  Jamie Strickland is a 69 y.o.-year-old female,  for follow up of Type 2 diabetes, uncontrolled, without complications (?developing Stage 2/3 CKD). Last visit was March 2016 with me.   HPI- Patient has been diagnosed with diabetes in ~2000. Recalls being initially on lifestyle modifications.  Tried  Metformin, Glipizide in the past.  she has  been on insulin since about 10+ years.   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    Last hemoglobin A1c was: Lab Results  Component Value Date   HGBA1C 10.4* 05/28/2014   HGBA1C 9.9* 11/13/2013   HGBA1C 10.0* 04/21/2013     Pt checks her sugars 3-4 in  a day . Uses accuChek nano glucometer. By meter review they are:  PREMEAL Breakfast ( 9pm) Lunch (4am) Dinner( 8am) Bedtime Overall  Glucose range: 93-372 106-365 207-329 253-358   Mean/median:        POST-MEAL PC Breakfast PC Lunch PC Dinner  Glucose range:     Mean/median:      * more higher readings than lower readings  Hypoglycemia-  No lows. Lowest sugar was n/a; she has hypoglycemia awareness at 70.   Dietary habits- eats 2-three times daily. Desserts weak point. Has a lot of cakes at work. No sodas. Occasionally doesn't have time to eat lunch at work.   Exercise- no time to exercise , occasionally swims Weight -  Wt Readings from Last 3 Encounters:  08/02/14 221 lb  (100.245 kg)  07/12/14 224 lb 4 oz (101.719 kg)  05/28/14 220 lb 12.8 oz (100.154 kg)    Diabetes Complications-  Nephropathy- Yes, ? Stage 2/3 developing  CKD, last BUN/creatinine- Lab Results  Component Value Date   BUN 24* 07/12/2014   CREATININE 0.81 07/12/2014   Lab Results  Component Value Date   GFR 74.93 07/12/2014   MICRALBCREAT 4.0 07/12/2014   Retinopathy- No, Last DEE was in last year, has appt for end April Neuropathy- no numbness and tingling in )her feet. No known neuropathy.  Associated history - No CAD . No prior stroke. No hypothyroidism. her last TSH was  Lab Results  Component Value Date   TSH 0.83 10/17/2012    Hyperlipidemia-  her last set of lipids were- Currently on Lipitor 20 mg. Tolerating well.  Not fasting today Lab Results  Component Value Date   CHOL 153 11/13/2013   HDL 37.80* 11/13/2013   LDLCALC 65 11/13/2013   LDLDIRECT 58.4 10/17/2012   TRIG 251.0* 11/13/2013   CHOLHDL 4 11/13/2013    Blood Pressure/HTN- Patient's blood pressure is not well controlled today on current regimen that includes ACE-I ( Lisinopril). Hasn't taken the med today.  I have reviewed the patient's past medical history, medications and allergies.   Current Outpatient Prescriptions on File Prior to Visit  Medication Sig Dispense Refill  . ACCU-CHEK SMARTVIEW test strip TEST BLOOD SUGAR TWICE DAILY AS NEEDED 100 each 3  . albuterol (VENTOLIN HFA) 108 (90 BASE) MCG/ACT inhaler Inhale 2 puffs four times daily as needed for asthma     . aspirin EC 81 MG tablet Take 81 mg by mouth daily.    Marland Kitchen atorvastatin (LIPITOR) 20 MG tablet TAKE 1 TABLET BY MOUTH EVERY DAY 90 tablet 0  . canagliflozin (INVOKANA) 100 MG TABS tablet Take 1 tablet (100 mg total) by mouth daily. 30 tablet 3  . Fluticasone-Salmeterol (ADVAIR DISKUS) 250-50 MCG/DOSE AEPB Inhale 1 puff into the lungs 2 (two) times daily. 60 each 11  . glipiZIDE (GLUCOTROL) 5 MG tablet TAKE 1 TABLET BY MOUTH EVERY DAY AT  BREAKFAST 90 tablet 0  . HUMULIN 70/30 (70-30) 100 UNIT/ML injection INJECT 50 UNITS INTO THE SKIN TWICE DAILY WITH A MEAL 100 mL 3  . Insulin Syringe-Needle U-100 (INSULIN SYRINGE .5CC/30GX5/16") 30G X 5/16" 0.5 ML MISC USE 3-4 TIMES DAILY 300 each 6  . lisinopril (PRINIVIL,ZESTRIL) 20 MG tablet TAKE 1 TABLET BY MOUTH EVERY DAY 90 tablet 1  . metFORMIN (GLUCOPHAGE) 1000 MG tablet TAKE 1 TABLET BY MOUTH TWICE DAILY WITH A MEAL 60 tablet 0   No current facility-administered medications on file prior to visit.   Allergies  Allergen Reactions  . Codeine     REACTION: n /v    Review of Systems- [ x ]  Complains of    [  ]  denies [  ] Recent weight change [  ]  Fatigue [ x ] polydipsia [ x ] polyuria [  ]  nocturia [  ]  vision difficulty [  ] chest pain [  ] shortness of breath [  ] leg swelling [  ] cough [  ] nausea/vomiting [  ] diarrhea [  ] constipation [  ] abdominal pain [  ]  tingling/numbness in extremities [  ]  concern with feet ( wounds/sores)   PE: BP 146/82 mmHg  Pulse 84  Resp 14  Ht  (1.6 m)  Wt 221 lb (100.245 kg)  BMI 39.16 kg/m2  SpO2 96% Wt Readings from Last 3 Encounters:  08/02/14 221 lb (100.245 kg)  07/12/14 224 lb 4 oz (101.719 kg)  05/28/14 220 lb 12.8 oz (100.154 kg)   Exam: deferred  ASSESSMENT AND PLAN: Problem List Items Addressed This Visit      Cardiovascular and Mediastinum   Essential hypertension, benign    Bp not at target today. Reevaluate at next visit.  Urine MA negative March 2016 Hasnt taken her med prior to coming.  She was asked to monitor her BP at home at other times.         Other   Diabetes mellitus type 2, uncontrolled, without complications - Primary    Recent A1c not at target. Continue checking sugars 4 x daily.   She has another month's Rx for Humulin 70/30 that she picked up yesterday.  Continue current Invokana, Glipizide and metformin.  Adjust Humulin 70/30 to 60 units with BF ( 9pm) and 56 units with  supper( 9am). Test BMP today. At next visit, will change to basal/bolus insulin to tighten insulin control. She was asked to find out which options are better covered. Notify if lows.  Asked to stop regular insulin ( was not using it anyways in the past several weeks)  Relevant Orders   Basic metabolic panel   Obesity    Discussed need to control portion sizes and suggested adding a walk. She is reluctant to exercise , as doesn't have the time to do. She will work on her diet.already seeing some weight loss after start of Invokana.             - Return to clinic in  3 weeks with sugar log/meter.  Jamie Strickland [facility] 08/02/2014 8:53 AM

## 2014-08-02 NOTE — Assessment & Plan Note (Addendum)
Recent A1c not at target. Continue checking sugars 4 x daily.   She has another month's Rx for Humulin 70/30 that she picked up yesterday.  Continue current Invokana, Glipizide and metformin.  Adjust Humulin 70/30 to 60 units with BF ( 9pm) and 56 units with supper( 9am). Test BMP today. At next visit, will change to basal/bolus insulin to tighten insulin control. She was asked to find out which options are better covered. Notify if lows.  Asked to stop regular insulin ( was not using it anyways in the past several weeks)

## 2014-08-02 NOTE — Assessment & Plan Note (Signed)
Bp not at target today. Reevaluate at next visit.  Urine MA negative March 2016 Hasnt taken her med prior to coming.  She was asked to monitor her BP at home at other times.    

## 2014-08-02 NOTE — Assessment & Plan Note (Signed)
Discussed need to control portion sizes and suggested adding a walk. She is reluctant to exercise , as doesn't have the time to do. She will work on her diet.already seeing some weight loss after start of Invokana.

## 2014-08-02 NOTE — Progress Notes (Signed)
Pre visit review using our clinic review tool, if applicable. No additional management support is needed unless otherwise documented below in the visit note. 

## 2014-08-02 NOTE — Patient Instructions (Signed)
Check sugars 4 x daily ( before each meal and at bedtime).  Record them in a log book and bring that/meter to next appointment.   Change Humulin 70/30 at 60 units at 9pm ( BF)  And 56 units with supper ( 9am).  Continue current Invokana, glipizide and metfoirmin.  At next visit will change to basal insulin with either Toujeo/lantus/levemir And add mealtime insulin with following options ( Novolog/apidra/humalog) Please find which of the basal and meal time insulin options are covered at lower tier by your plan.  Labs today for GFR  Please come back for a follow-up appointment in 3 weeks

## 2014-08-23 ENCOUNTER — Encounter: Payer: Self-pay | Admitting: Nurse Practitioner

## 2014-08-23 ENCOUNTER — Ambulatory Visit (INDEPENDENT_AMBULATORY_CARE_PROVIDER_SITE_OTHER): Payer: Commercial Managed Care - PPO | Admitting: Nurse Practitioner

## 2014-08-23 VITALS — BP 150/88 | HR 88 | Temp 97.8°F | Resp 14 | Ht 63.0 in | Wt 219.0 lb

## 2014-08-23 DIAGNOSIS — J302 Other seasonal allergic rhinitis: Secondary | ICD-10-CM

## 2014-08-23 DIAGNOSIS — J4520 Mild intermittent asthma, uncomplicated: Secondary | ICD-10-CM | POA: Diagnosis not present

## 2014-08-23 DIAGNOSIS — E1165 Type 2 diabetes mellitus with hyperglycemia: Secondary | ICD-10-CM | POA: Diagnosis not present

## 2014-08-23 DIAGNOSIS — IMO0001 Reserved for inherently not codable concepts without codable children: Secondary | ICD-10-CM

## 2014-08-23 MED ORDER — ALBUTEROL SULFATE HFA 108 (90 BASE) MCG/ACT IN AERS: 2.0000 | INHALATION_SPRAY | RESPIRATORY_TRACT | Status: DC | PRN

## 2014-08-23 NOTE — Assessment & Plan Note (Signed)
Sees Dr. Welford RochePhadke for Diabetes, obesity, hyperlipidemia. Will encourage diet and exercise at each visit.

## 2014-08-23 NOTE — Assessment & Plan Note (Signed)
Refilled albuterol inhaler. Pt states Advair is expensive (104 out of pocket last month) and she is looking to switch to another cheaper version soon. Mild intermittent symptoms still evident. Spring is worse for pt. Will follow.

## 2014-08-23 NOTE — Progress Notes (Signed)
Subjective:    Patient ID: Jamie Strickland, female    DOB: [birth year], 69 y.o.   MRN: [id number]  HPI  Jamie Strickland is a 69 yo female with a CC switching providers and refill of inhaler.   1) Coughing, sneezing related to allergies and asthma. Spring is worse for her. She reports bronchitis often.  Advair daily 2 puffs in morning and 2 in the evening  Albuterol- have not used it because it was out   Needed it this week  Tried allegra/claritin/zyrtec- not helpful  Flonase helpful  Tree pollen worst for her  2) 8th of May eye exam scheduled  Review of Systems  Constitutional: Negative for fever, chills, diaphoresis and fatigue.  HENT: Positive for sneezing.   Eyes: Negative for visual disturbance.  Respiratory: Positive for cough. Negative for chest tightness, shortness of breath and wheezing.   Cardiovascular: Negative for chest pain, palpitations and leg swelling.  Gastrointestinal: Negative for nausea, vomiting and diarrhea.  Skin: Negative for rash.  Neurological: Negative for dizziness, weakness, numbness and headaches.  Psychiatric/Behavioral: The patient is not nervous/anxious.    Past Medical History  Diagnosis Date  . Allergic rhinitis   . Hx of colonic polyps   . Diabetes mellitus type II   . Hyperlipidemia   . Hypertension   . Osteoarthritis     History   Social History  . Marital Status: Divorced    Spouse Name: N/A  . Number of Children: 2  . 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 Smoking status: Former Games developer  . Smokeless tobacco: Former Neurosurgeon    Quit date: 08/09/2008  . Alcohol Use: 0.0 oz/week    0 Standard drinks or equivalent per week     Comment: Rare  . Drug Use: No  . Sexual Activity: Not Currently   Other Topics Concern  . Not on file   Social History Narrative   No living will   Son, [name], is health care POA   2 sons- Molli Hazard and [name] [phone] and [phone])    4  grandchildren and 6 great-grandchildren   1 dog lives inside   Right handed    Caffeine- 1 soda-diet, 1 cup of coffee   Discussed DNR and she wants this. Done 10/17/12   No tube feeds if cognitively unaware    Past Surgical History  Procedure Laterality Date  . Cholecystectomy    . Lasik    . Cataract extraction w/ intraocular lens  implant, bilateral Bilateral 2014  . Cataract extraction w/ intraocular lens  implant, bilateral  11/14  . 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 Diabetes Mother   . Heart disease Mother   . Diabetes Father     brittle diabetes  . Asthma Father 35  . Liver disease Father     liver problems  . Heart disease Father   . Heart disease Brother     CAD  . Cancer Maternal Aunt     Breast  . Heart disease Maternal Grandfather     CAD    Allergies  Allergen Reactions  . Codeine     REACTION: n /v    Current Outpatient Prescriptions on File Prior to Visit  Medication Sig Dispense Refill  . ACCU-CHEK SMARTVIEW  test strip TEST BLOOD SUGAR TWICE DAILY AS NEEDED 100 each 3  . aspirin EC 81 MG tablet Take 81 mg by mouth daily.    Marland Kitchen. atorvastatin (LIPITOR) 20 MG tablet TAKE 1 TABLET BY MOUTH EVERY DAY 90 tablet 0  . canagliflozin (INVOKANA) 100 MG TABS tablet Take 1 tablet (100 mg total) by mouth daily. 30 tablet 3  . Fluticasone-Salmeterol (ADVAIR DISKUS) 250-50 MCG/DOSE AEPB Inhale 1 puff into the lungs 2 (two) times daily. 60 each 11  . glipiZIDE (GLUCOTROL) 5 MG tablet TAKE 1 TABLET BY MOUTH EVERY DAY AT BREAKFAST 90 tablet 0  . HUMULIN 70/30 (70-30) 100 UNIT/ML injection INJECT 50 UNITS INTO THE SKIN TWICE DAILY WITH A MEAL 100 mL 3  . Insulin Syringe-Needle U-100 (INSULIN SYRINGE .5CC/30GX5/16") 30G X 5/16" 0.5 ML MISC USE 3-4 TIMES DAILY 300 each 6  . lisinopril (PRINIVIL,ZESTRIL) 20 MG tablet TAKE 1 TABLET BY MOUTH EVERY DAY 90 tablet 1  . metFORMIN  (GLUCOPHAGE) 1000 MG tablet TAKE 1 TABLET BY MOUTH TWICE DAILY WITH A MEAL 60 tablet 0   No current facility-administered medications on file prior to visit.      Objective:   Physical Exam  Constitutional: She is oriented to person, place, and time. She appears well-developed and well-nourished. No distress.  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  Right Ear: External ear normal.  Left Ear: External ear normal.  Mouth/Throat: No oropharyngeal exudate.  Eyes: EOM are normal. Pupils are equal, round, and reactive to light. Right eye exhibits no discharge. Left eye exhibits no discharge. No scleral icterus.  Neck: Normal range of motion. Neck supple.  Cardiovascular: Normal rate, regular rhythm, normal heart sounds and intact distal pulses.  Exam reveals no gallop and no friction rub.   No murmur heard. Pulmonary/Chest: Effort normal and breath sounds normal. No respiratory distress. She has no wheezes. She has no rales. She exhibits no tenderness.  Lymphadenopathy:    She has no cervical adenopathy.  Neurological: She is alert and oriented to person, place, and time. No cranial nerve deficit. She exhibits normal muscle tone. Coordination normal.  Skin: Skin is warm and dry. No rash noted. She is not diaphoretic.  Psychiatric: She has a normal mood and affect. Her behavior is normal. Judgment and thought content normal.      Assessment & Plan:

## 2014-08-23 NOTE — Patient Instructions (Signed)
[facility]!   Call us if you need us.

## 2014-08-23 NOTE — Assessment & Plan Note (Signed)
Stable on flonase.

## 2014-08-23 NOTE — Progress Notes (Signed)
Pre visit review using our clinic review tool, if applicable. No additional management support is needed unless otherwise documented below in the visit note. 

## 2014-09-03 ENCOUNTER — Ambulatory Visit
Admit: 2014-09-03 | Disposition: A | Discharge status: Home / Self Care | Payer: Self-pay | Attending: Gastroenterology | Admitting: Gastroenterology

## 2014-09-03 LAB — HM COLONOSCOPY

## 2014-09-04 LAB — SURGICAL PATHOLOGY

## 2014-09-11 ENCOUNTER — Encounter: Payer: Self-pay | Admitting: *Deleted

## 2014-09-11 LAB — HM DIABETES EYE EXAM

## 2014-09-13 ENCOUNTER — Ambulatory Visit (INDEPENDENT_AMBULATORY_CARE_PROVIDER_SITE_OTHER): Payer: Commercial Managed Care - PPO | Admitting: Endocrinology

## 2014-09-13 VITALS — BP 150/88 | HR 84 | Resp 14 | Ht 63.0 in | Wt 221.8 lb

## 2014-09-13 DIAGNOSIS — E785 Hyperlipidemia, unspecified: Secondary | ICD-10-CM | POA: Diagnosis not present

## 2014-09-13 DIAGNOSIS — E1165 Type 2 diabetes mellitus with hyperglycemia: Secondary | ICD-10-CM

## 2014-09-13 DIAGNOSIS — I10 Essential (primary) hypertension: Secondary | ICD-10-CM | POA: Diagnosis not present

## 2014-09-13 DIAGNOSIS — IMO0001 Reserved for inherently not codable concepts without codable children: Secondary | ICD-10-CM

## 2014-09-13 LAB — HEMOGLOBIN A1C: HEMOGLOBIN A1C: 9.1 % — AB (ref 4.6–6.5)

## 2014-09-13 MED ORDER — INSULIN GLARGINE 100 UNIT/ML ~~LOC~~ SOLN: SUBCUTANEOUS | Status: DC

## 2014-09-13 MED ORDER — INSULIN ASPART 100 UNIT/ML ~~LOC~~ SOLN: SUBCUTANEOUS | Status: DC

## 2014-09-13 NOTE — Assessment & Plan Note (Signed)
Last LDL at target on current therapy. TGs are elevated, expect improvement with better sugars.

## 2014-09-13 NOTE — Assessment & Plan Note (Signed)
Recent A1c not at target. Update today. Continue checking sugars 4 x daily.   Recent sugars with some improvement.  Continue current Invokana, Glipizide and metformin.  Will to change to basal/bolus insulin to try and improve sugars. Will to try the sliding scale.  Stop Humulin 70/30.  Start Lantus at 60 units at bedtime ( 9am) Start Novolog at 20+1 scale with each meal.  Send me sugars in 2 weeks for further adjustments.  Notify if lows.  She doesn't wish to come frequently, as getting too expensive for her. Agreeable for 3 month follow up

## 2014-09-13 NOTE — Progress Notes (Signed)
Patient ID: Jamie Strickland, female   DOB: May 13, 1945, 69 y.o.   MRN: [id number]    Reason for visit-  Jamie Strickland is a 69 y.o.-year-old female,  for follow up of Type 2 diabetes, uncontrolled, without complications (?developing Stage 2/3 CKD). Last visit was March 2016 with me.   HPI- Patient has been diagnosed with diabetes in ~2000. Recalls being initially on lifestyle modifications.  Tried  Metformin, Glipizide in the past.  she has  been on insulin since about 10+ years.   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    Last hemoglobin A1c was: Lab Results  Component Value Date   HGBA1C 10.4* 05/28/2014   HGBA1C 9.9* 11/13/2013   HGBA1C 10.0* 04/21/2013     Pt checks her sugars 3-4 in  a day . Uses accuChek nano glucometer. By meter review they are:  PREMEAL Breakfast ( 9pm) Lunch (4am) Dinner( 8am) Bedtime Overall  Glucose range: 138-209 167-304 129-196    Mean/median:        POST-MEAL PC Breakfast PC Lunch PC Dinner  Glucose range:     Mean/median:      *higher readings last week as was on vacation  Hypoglycemia-  No lows. Lowest sugar was n/a; she has hypoglycemia awareness at 70.   Dietary habits- eats 2-three times daily. Desserts weak point. Has a lot of cakes at work. No sodas. Occasionally doesn't have time to eat lunch at work.   Exercise- no time to exercise , occasionally swims Weight -  Wt Readings from Last 3 Encounters:  09/13/14 221 lb 12 oz (100.585 kg)  08/23/14 219 lb (99.338 kg)  08/02/14 221  lb (100.245 kg)    Diabetes Complications-  Nephropathy- Yes, ? 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 No known neuropathy.  Associated history - No CAD . No prior stroke. No hypothyroidism. her last TSH was  Lab Results  Component Value Date   TSH 0.83 10/17/2012    Hyperlipidemia-  her last set of lipids were- Currently on Lipitor 20 mg. Tolerating well.  Not fasting today Lab Results  Component Value Date   CHOL 153 11/13/2013   HDL 37.80* 11/13/2013   LDLCALC 65 11/13/2013   LDLDIRECT 58.4 10/17/2012   TRIG 251.0* 11/13/2013   CHOLHDL 4 11/13/2013    Blood Pressure/HTN- Patient's blood pressure is not well controlled today on current regimen that includes ACE-I ( Lisinopril). Hasn't taken the med today.  I have reviewed the patient's past medical history, medications and allergies.   Current Outpatient Prescriptions on File Prior to Visit  Medication Sig Dispense Refill  . ACCU-CHEK SMARTVIEW test strip TEST  BLOOD SUGAR TWICE DAILY AS NEEDED 100 each 3  . albuterol (VENTOLIN HFA) 108 (90 BASE) MCG/ACT inhaler Inhale 2 puffs into the lungs as needed for wheezing or shortness of breath. Inhale 2 puffs four times daily as needed for asthma 1 Inhaler 2  . aspirin EC 81 MG tablet Take 81 mg by mouth daily.    Marland Kitchen atorvastatin (LIPITOR) 20 MG tablet TAKE 1 TABLET BY MOUTH EVERY DAY 90 tablet 0  . canagliflozin (INVOKANA) 100 MG TABS tablet Take 1 tablet (100 mg total) by mouth daily. 30 tablet 3  . Fluticasone-Salmeterol (ADVAIR DISKUS) 250-50 MCG/DOSE AEPB Inhale 1 puff into the lungs 2 (two) times daily. 60 each 11  . glipiZIDE (GLUCOTROL) 5 MG tablet TAKE 1 TABLET BY MOUTH EVERY DAY AT BREAKFAST 90  tablet 0  . Insulin Syringe-Needle U-100 (INSULIN SYRINGE .5CC/30GX5/16") 30G X 5/16" 0.5 ML MISC USE 3-4 TIMES DAILY 300 each 6  . lisinopril (PRINIVIL,ZESTRIL) 20 MG tablet TAKE 1 TABLET BY MOUTH EVERY DAY 90 tablet 1  . metFORMIN (GLUCOPHAGE) 1000 MG tablet TAKE 1 TABLET BY MOUTH TWICE DAILY WITH A MEAL 60 tablet 0   No current facility-administered medications on file prior to visit.   Allergies  Allergen Reactions  . Codeine     REACTION: n /v    Review of Systems- [ x ]  Complains of    [  ]  denies [  ] Recent weight change [  ]  Fatigue [  ] polydipsia [  ] polyuria [  ]  nocturia [  ]  vision difficulty [  ] chest pain [  ] shortness of breath [  ] leg swelling [  ] cough [  ] nausea/vomiting [  ] diarrhea [  ] constipation [  ] abdominal pain [  ]  tingling/numbness in extremities [  ]  concern with feet ( wounds/sores)   PE: BP 150/88 mmHg  Pulse 84  Resp 14  Ht  (1.6 m)  Wt 221 lb 12 oz (100.585 kg)  BMI 39.29 kg/m2  SpO2 97% Wt Readings from Last 3 Encounters:  09/13/14 221 lb 12 oz (100.585 kg)  08/23/14 219 lb (99.338 kg)  08/02/14 221 lb (100.245 kg)   Exam: deferred  ASSESSMENT AND PLAN: Problem List Items Addressed This Visit      Cardiovascular and Mediastinum   Essential hypertension, benign    Bp not at target today. Reevaluate at next visit.  Urine MA negative March 2016 Hasnt taken her med prior to coming.  She was asked to monitor her BP at home at other times.           Other   Hyperlipemia    Last LDL at target on current therapy. TGs are elevated, expect improvement with better sugars.        Diabetes mellitus type 2, uncontrolled, without complications - Primary    Recent A1c not at target. Update today. Continue checking sugars 4 x daily.   Recent sugars with some improvement.  Continue current Invokana, Glipizide and metformin.  Will to change to basal/bolus insulin to try and improve sugars. Will to try the sliding  scale.  Stop Humulin 70/30.  Start Lantus at 60 units at bedtime ( 9am) Start Novolog at 20+1 scale with each meal.  Send me sugars in 2 weeks for further adjustments.  Notify if lows.  She doesn't wish to come frequently, as getting too expensive for her. Agreeable for 3  month follow up             Relevant Medications   insulin glargine (LANTUS) 100 UNIT/ML injection   insulin aspart (NOVOLOG) 100 UNIT/ML injection   Other Relevant Orders   Hemoglobin A1c        - Return to clinic in  3 months with sugar log/meter.  Jamie Strickland 09/13/2014 8:47 AM

## 2014-09-13 NOTE — Assessment & Plan Note (Signed)
Bp not at target today. Reevaluate at next visit.  Urine MA negative March 2016 Hasnt taken her med prior to coming.  She was asked to monitor her BP at home at other times.

## 2014-09-13 NOTE — Patient Instructions (Signed)
Check sugars 4 x daily ( before each meal and at bedtime).  Record them in a log book and bring that/meter to next appointment.   Stop Humulin 70/30.  Start lantus at 60 units at your bedtime.  Start Novolog per scale prior to meal. Give Novolog at the start of the meal based on pre meal sugars.  If you are not eating, then skip that shot of Novolog.  Space Novolog shots by 5 hours apart each.    Blood Glucose (mg/dL)  Breakfast  (Units Novolog Insulin)  Lunch  (Units Novolog Insulin)  Supper  (Units Novolog Insulin)  Nighttime (Units Novolog Insulin)   <70 Treat the low blood sugar.  Recheck blood glucose  in 15 mins. If sugar is more than 70, then take the number of units of insulin in the 70-90 row, if before a meal.   70-90  12  12  12   0  91-130 (Base Dose)  20  20  20   0   131-150  21  21  21   0   151-200  22  22  22   0   201-250  23  23  23  0   251-300  24  24  24 1    301-350  25  25  25 2    351-400  26  26  26  3    401-450  27  27  27 4    >450  28  28  28  5    Lantus insulin 60 units subcutaneous Nightly     Send me sugars in 2 weeks via fax for further adjustments Please come back for a follow-up appointment in 3 months

## 2014-09-13 NOTE — Progress Notes (Signed)
Pre visit review using our clinic review tool, if applicable. No additional management support is needed unless otherwise documented below in the visit note. 

## 2014-09-18 ENCOUNTER — Encounter: Payer: Self-pay | Admitting: *Deleted

## 2014-09-25 ENCOUNTER — Other Ambulatory Visit: Payer: Self-pay | Admitting: Endocrinology

## 2014-09-25 DIAGNOSIS — E113299 Type 2 diabetes mellitus with mild nonproliferative diabetic retinopathy without macular edema, unspecified eye: Secondary | ICD-10-CM | POA: Insufficient documentation

## 2014-09-25 HISTORY — DX: Type 2 diabetes mellitus with mild nonproliferative diabetic retinopathy without macular edema, unspecified eye: E11.3299

## 2014-10-10 ENCOUNTER — Telehealth: Payer: Self-pay

## 2014-10-10 NOTE — Telephone Encounter (Signed)
Spoke to patient to notify her of Dr. Ephriam JenkinsPhadke's comments. Patient verbalized understanding. Patient stated thank you for calling me this morning since I work nights.

## 2014-10-10 NOTE — Telephone Encounter (Signed)
-----   Message from Quentin Cornwalladhika P Phadke, MD sent at 10/09/2014  2:31 PM EDT ----- Regarding: sugar log review I have reviewed her sugar log after switching over to lantus and Novolog.  Overall some high sugars, are likely related to her diet.  Most of them look good.  8-9 am- 106-262 12-1pm-145-199 7-8pm 111-228  Increase lantus to 62 units daily ( instead of 60 units) Continue same Novolog and follow chart at each meal Good job checking sugars.  thanks

## 2014-10-18 ENCOUNTER — Telehealth: Payer: Self-pay | Admitting: *Deleted

## 2014-10-18 NOTE — Telephone Encounter (Signed)
Left message for pt to return my call.

## 2014-10-18 NOTE — Telephone Encounter (Signed)
-----   Message from Quentin Cornwall, MD sent at 10/18/2014  2:00 PM EDT ----- Regarding: log reviewed Looks like the patient has sent me 4 days of additional data since the last time [place] spoke to her (6/1).  No changes to insulin regimen as of now.   thanks

## 2014-10-19 NOTE — Telephone Encounter (Signed)
Pt notified and verbalized understanding.

## 2014-10-31 ENCOUNTER — Other Ambulatory Visit: Payer: Self-pay | Admitting: *Deleted

## 2014-10-31 MED ORDER — LISINOPRIL 20 MG PO TABS
20.0000 mg | ORAL_TABLET | Freq: Every day | ORAL | Status: DC
Start: 2014-10-31 — End: 2015-09-01

## 2014-11-01 ENCOUNTER — Other Ambulatory Visit: Payer: Self-pay | Admitting: *Deleted

## 2014-11-01 ENCOUNTER — Telehealth: Payer: Self-pay | Admitting: Nurse Practitioner

## 2014-11-01 MED ORDER — ATORVASTATIN CALCIUM 20 MG PO TABS: 20.0000 mg | ORAL_TABLET | Freq: Every day | ORAL | Status: DC

## 2014-11-01 NOTE — Telephone Encounter (Signed)
Pt is out of BP meds- Lipitor. Please advise pt/msn

## 2014-11-16 ENCOUNTER — Ambulatory Visit (INDEPENDENT_AMBULATORY_CARE_PROVIDER_SITE_OTHER): Payer: Commercial Managed Care - PPO | Admitting: Nurse Practitioner

## 2014-11-16 ENCOUNTER — Encounter (INDEPENDENT_AMBULATORY_CARE_PROVIDER_SITE_OTHER): Payer: Self-pay

## 2014-11-16 VITALS — BP 148/72 | HR 86 | Temp 97.9°F | Resp 14 | Ht 63.0 in | Wt 222.6 lb

## 2014-11-16 DIAGNOSIS — Z0000 Encounter for general adult medical examination without abnormal findings: Secondary | ICD-10-CM

## 2014-11-16 DIAGNOSIS — Z23 Encounter for immunization: Secondary | ICD-10-CM | POA: Diagnosis not present

## 2014-11-16 DIAGNOSIS — J4520 Mild intermittent asthma, uncomplicated: Secondary | ICD-10-CM | POA: Diagnosis not present

## 2014-11-16 DIAGNOSIS — IMO0001 Reserved for inherently not codable concepts without codable children: Secondary | ICD-10-CM

## 2014-11-16 DIAGNOSIS — E785 Hyperlipidemia, unspecified: Secondary | ICD-10-CM

## 2014-11-16 DIAGNOSIS — E1165 Type 2 diabetes mellitus with hyperglycemia: Secondary | ICD-10-CM | POA: Diagnosis not present

## 2014-11-16 DIAGNOSIS — E669 Obesity, unspecified: Secondary | ICD-10-CM

## 2014-11-16 MED ORDER — GLIPIZIDE 5 MG PO TABS: ORAL_TABLET | ORAL | Status: DC

## 2014-11-16 MED ORDER — SALMETEROL XINAFOATE 50 MCG/DOSE IN AEPB: 1.0000 | INHALATION_SPRAY | Freq: Two times a day (BID) | RESPIRATORY_TRACT | Status: DC

## 2014-11-16 MED ORDER — ALBUTEROL SULFATE HFA 108 (90 BASE) MCG/ACT IN AERS: 2.0000 | INHALATION_SPRAY | RESPIRATORY_TRACT | Status: DC | PRN

## 2014-11-16 NOTE — Assessment & Plan Note (Signed)
Mild intermittent symptoms still evident. Switched from Advair to Serevent due to cost of medication. 1 puff twice daily and explained that she will need to use daily to prevent symptoms. Refilled Albuterol and sent to pharmacy e-script. FU in 3 months.

## 2014-11-16 NOTE — Progress Notes (Signed)
Subjective:    Patient ID: Jamie Strickland, female    DOB: [birth year], 69 y.o.   MRN: [id number]  HPI  Jamie Strickland is a 69 yo female with a CC of annual physical exam.   1) Health Maintenance-   Diet- Watching sugary foods, cut down on bread, potatoes/rice rarely, water, green tea, unsweet tea with artificial sweeter, 2-3 diet sodas a week.   Exercise- No exercise formal  Immunizations- pna 23 2014, pna 13   Mammogram-09/2014, self breast exams, refuses breast exam today   Pap- Hysterectomy   Bone Density- 2-3 years ago  Colonoscopy- 2016, repeat 5-10 years   Eye Exam- 5/16, new glasses, no retinal issues per pt  Dental Exam- Not UTD  2) Chronic Problems-  Diabetes- Not going to see another endocrinologist after Dr. Welford RochePhadke. Invokana- yeast infection after 4 days of taking it. Taking metformin and glipizide as prescribed. Last A1c 9.1 in May.   3) Acute Problems-  Lipid panel- with A1c after 12/14/14   OA- not improving or wosening- stable  Advair $120  and albuterol  Serevent-   Review of Systems  Constitutional: Negative for fever, chills, diaphoresis, appetite change, fatigue and unexpected weight change.  HENT: Negative for tinnitus and trouble swallowing.   Eyes: Negative for visual disturbance.  Respiratory: Positive for shortness of breath and wheezing. Negative for cough and chest tightness.   Cardiovascular: Negative for chest pain, palpitations and leg swelling.  Gastrointestinal: Negative for nausea, vomiting, abdominal pain, diarrhea, constipation and blood in stool.  Endocrine: Negative for polydipsia, polyphagia and polyuria.  Genitourinary: Negative for dysuria, hematuria, vaginal discharge and vaginal pain.  Musculoskeletal: Negative for myalgias, back pain, arthralgias and gait problem.  Skin: Negative for color change and rash.  Allergic/Immunologic: Positive for environmental allergies.  Neurological: Negative for dizziness, weakness, numbness and headaches.   Hematological: Does not bruise/bleed easily.  Psychiatric/Behavioral: Negative for suicidal ideas and sleep disturbance. The patient is not nervous/anxious.    Past Medical History  Diagnosis Date  . Allergic rhinitis   . Hx of colonic polyps   . Diabetes mellitus type II   . Hyperlipidemia   . Hypertension   . Osteoarthritis     History   Social History  . Marital Status: Single    Spouse Name: N/A  . Number of Children: 2  . Years of Education: N/A   Occupational History  . LPN     on nights at [employer]   Social History Main Topics  . Smoking status: Former Games developermoker  . Smokeless tobacco: Former NeurosurgeonUser    Quit date: 08/09/2008  . Alcohol Use: 0.0 oz/week    0 Standard drinks or equivalent per week     Comment: Rare  . Drug Use: No  . Sexual Activity: Not Currently   Other Topics Concern  . Not on file   Social History Narrative   No living will   Son, Jamie Strickland, is health care POA   2 sons- Jamie Strickland and Jamie Strickland [phone])    4 grandchildren and 6 great-grandchildren   1 dog lives inside   Right handed    Caffeine- 1 soda-diet, 1 cup of coffee   Discussed DNR and she wants this. Done 10/17/12   No tube feeds if cognitively unaware    Past Surgical History  Procedure Laterality Date  . Cholecystectomy    . Lasik    . Cataract extraction w/ intraocular lens  implant, bilateral Bilateral 2014  . Cataract  extraction w/ intraocular lens  implant, bilateral  11/14  . 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 Diabetes Mother   . Heart disease Mother   . Diabetes Father     brittle diabetes  . Asthma Father 66  . Liver disease Father     liver problems  . Heart disease Father   . Heart disease Brother     CAD  . Cancer Maternal Aunt     Breast  . Heart disease Maternal Grandfather     CAD    Allergies  Allergen Reactions  . Codeine     REACTION: n /v  .  Invokana [Canagliflozin] Other (See Comments)    Yeast infections    Current Outpatient Prescriptions on File Prior to Visit  Medication Sig Dispense Refill  . ACCU-CHEK SMARTVIEW test strip TEST BLOOD SUGAR TWICE DAILY AS NEEDED 100 each 3  . aspirin EC 81 MG tablet Take 81 mg by mouth daily.    Marland Kitchen atorvastatin (LIPITOR) 20 MG tablet Take 1 tablet (20 mg total) by mouth daily. 90 tablet 1  . insulin aspart (NOVOLOG) 100 UNIT/ML injection Inject three times daily Based on sliding scale between 20-30 units. MDD80 units 20 mL 3  . insulin glargine (LANTUS) 100 UNIT/ML injection Inject 60 units [place] at bedtime (Patient taking differently: Inject 62 Units into the skin 1 day or 1 dose. Inject 60 units [place] at bedtime) 10 mL 3  . Insulin Syringe-Needle U-100 (INSULIN SYRINGE .5CC/30GX5/16") 30G X 5/16" 0.5 ML MISC USE 3-4 TIMES DAILY 300 each 6  . lisinopril (PRINIVIL,ZESTRIL) 20 MG tablet Take 1 tablet (20 mg total) by mouth daily. 90 tablet 1  . metFORMIN (GLUCOPHAGE) 1000 MG tablet TAKE 1 TABLET BY MOUTH TWICE DAILY WITH A MEAL 60 tablet 0   No current facility-administered medications on file prior to visit.      Objective:   Physical Exam  Constitutional: She is oriented to person, place, and time. She appears well-developed and well-nourished. No distress.  BP 148/72 mmHg  Pulse 86  Temp(Src) 97.9 F (36.6 C)  Resp 14  Ht  (1.6 m)  Wt 222 lb 9.6 oz (100.971 kg)  BMI 39.44 kg/m2  SpO2 95%   HENT:  Head: Normocephalic and atraumatic.  Right Ear: External ear normal.  Left Ear: External ear normal.  Nose: Nose normal.  Mouth/Throat: Oropharynx is clear and moist. No oropharyngeal exudate.  TMs and canals clear bilaterally with scar tissue from childhood ear infections  Eyes: Conjunctivae and EOM are normal. Pupils are equal, round, and reactive to light. Right eye exhibits no discharge. Left eye exhibits no discharge. No scleral icterus.  Glasses  Neck: Normal range of motion.  Neck supple. No thyromegaly present.  Cardiovascular: Normal rate, regular rhythm, normal heart sounds and intact distal pulses.  Exam reveals no gallop and no friction rub.   No murmur heard. Pulmonary/Chest: Effort normal and breath sounds normal. No respiratory distress. She has no wheezes. She has no rales. She exhibits no tenderness.  Abdominal: Soft. Bowel sounds are normal. She exhibits no distension and no mass. There is no tenderness. There is no rebound and no guarding.  Genitourinary:  Deferred  Musculoskeletal: Normal range of motion. She exhibits no edema or tenderness.  Lymphadenopathy:    She has no cervical adenopathy.  Neurological: She is alert and oriented to person,  place, and time. She has normal reflexes. No cranial nerve deficit. She exhibits normal muscle tone. Coordination normal.  Monofilament exam normal bilaterally  Skin: Skin is warm and dry. No rash noted. She is not diaphoretic. No erythema. No pallor.  Toenails have fungal changes No wounds seen on legs or feet  Psychiatric: She has a normal mood and affect. Her behavior is normal. Judgment and thought content normal.      Assessment & Plan:

## 2014-11-16 NOTE — Patient Instructions (Addendum)
Glipizide twice daily with meals (breakfast and dinner)   Fasting lab appointment in August past the 6th.   3 month appointment for follow up.  Health Maintenance Adopting a healthy lifestyle and getting preventive care can go a long way to promote health and wellness. Talk with your health care provider about what schedule of regular examinations is right for you. This is a good chance for you to check in with your provider about disease prevention and staying healthy. In between checkups, there are plenty of things you can do on your own. Experts have done a lot of research about which lifestyle changes and preventive measures are most likely to keep you healthy. Ask your health care provider for more information. WEIGHT AND DIET  Eat a healthy diet  Be sure to include plenty of vegetables, fruits, low-fat dairy products, and lean protein.  Do not eat a lot of foods high in solid fats, added sugars, or salt.  Get regular exercise. This is one of the most important things you can do for your health.  Most adults should exercise for at least 150 minutes each week. The exercise should increase your heart rate and make you sweat (moderate-intensity exercise).  Most adults should also do strengthening exercises at least twice a week. This is in addition to the moderate-intensity exercise.  Maintain a healthy weight  Body mass index (BMI) is a measurement that can be used to identify possible weight problems. It estimates body fat based on height and weight. Your health care provider can help determine your BMI and help you achieve or maintain a healthy weight.  For females 28 years of age and older:   A BMI below 18.5 is considered underweight.  A BMI of 18.5 to 24.9 is normal.  A BMI of 25 to 29.9 is considered overweight.  A BMI of 30 and above is considered obese.  Watch levels of cholesterol and blood lipids  You should start having your blood tested for lipids and cholesterol  at 67 years of age, then have this test every 5 years.  You may need to have your cholesterol levels checked more often if:  Your lipid or cholesterol levels are high.  You are older than 67 years of age.  You are at high risk for heart disease.  CANCER SCREENING   Lung Cancer  Lung cancer screening is recommended for adults 69-96 years old who are at high risk for lung cancer because of a history of smoking.  A yearly low-dose CT scan of the lungs is recommended for people who:  Currently smoke.  Have quit within the past 15 years.  Have at least a 30-pack-year history of smoking. A pack year is smoking an average of one pack of cigarettes a day for 1 year.  Yearly screening should continue until it has been 15 years since you quit.  Yearly screening should stop if you develop a health problem that would prevent you from having lung cancer treatment.  Breast Cancer  Practice breast self-awareness. This means understanding how your breasts normally appear and feel.  It also means doing regular breast self-exams. Let your health care provider know about any changes, no matter how small.  If you are in your 20s or 30s, you should have a clinical breast exam (CBE) by a health care provider every 1-3 years as part of a regular health exam.  If you are 52 or older, have a CBE every year. Also consider having a breast  X-ray (mammogram) every year.  If you have a family history of breast cancer, talk to your health care provider about genetic screening.  If you are at high risk for breast cancer, talk to your health care provider about having an MRI and a mammogram every year.  Breast cancer gene (BRCA) assessment is recommended for women who have family members with BRCA-related cancers. BRCA-related cancers include:  Breast.  Ovarian.  Tubal.  Peritoneal cancers.  Results of the assessment will determine the need for genetic counseling and BRCA1 and BRCA2  testing. Cervical Cancer Routine pelvic examinations to screen for cervical cancer are no longer recommended for nonpregnant women who are considered low risk for cancer of the pelvic organs (ovaries, uterus, and vagina) and who do not have symptoms. A pelvic examination may be necessary if you have symptoms including those associated with pelvic infections. Ask your health care provider if a screening pelvic exam is right for you.   The Pap test is the screening test for cervical cancer for women who are considered at risk.  If you had a hysterectomy for a problem that was not cancer or a condition that could lead to cancer, then you no longer need Pap tests.  If you are older than 65 years, and you have had normal Pap tests for the past 10 years, you no longer need to have Pap tests.  If you have had past treatment for cervical cancer or a condition that could lead to cancer, you need Pap tests and screening for cancer for at least 20 years after your treatment.  If you no longer get a Pap test, assess your risk factors if they change (such as having a new sexual partner). This can affect whether you should start being screened again.  Some women have medical problems that increase their chance of getting cervical cancer. If this is the case for you, your health care provider may recommend more frequent screening and Pap tests.  The human papillomavirus (HPV) test is another test that may be used for cervical cancer screening. The HPV test looks for the virus that can cause cell changes in the cervix. The cells collected during the Pap test can be tested for HPV.  The HPV test can be used to screen women 52 years of age and older. Getting tested for HPV can extend the interval between normal Pap tests from three to five years.  An HPV test also should be used to screen women of any age who have unclear Pap test results.  After 67 years of age, women should have HPV testing as often as Pap  tests.  Colorectal Cancer  This type of cancer can be detected and often prevented.  Routine colorectal cancer screening usually begins at 67 years of age and continues through 67 years of age.  Your health care provider may recommend screening at an earlier age if you have risk factors for colon cancer.  Your health care provider may also recommend using home test kits to check for hidden blood in the stool.  A small camera at the end of a tube can be used to examine your colon directly (sigmoidoscopy or colonoscopy). This is done to check for the earliest forms of colorectal cancer.  Routine screening usually begins at age 2.  Direct examination of the colon should be repeated every 5-10 years through 67 years of age. However, you may need to be screened more often if early forms of precancerous polyps or small  growths are found. Skin Cancer  Check your skin from head to toe regularly.  Tell your health care provider about any new moles or changes in moles, especially if there is a change in a mole's shape or color.  Also tell your health care provider if you have a mole that is larger than the size of a pencil eraser.  Always use sunscreen. Apply sunscreen liberally and repeatedly throughout the day.  Protect yourself by wearing long sleeves, pants, a wide-brimmed hat, and sunglasses whenever you are outside. HEART DISEASE, DIABETES, AND HIGH BLOOD PRESSURE   Have your blood pressure checked at least every 1-2 years. High blood pressure causes heart disease and increases the risk of stroke.  If you are between 3 years and 20 years old, ask your health care provider if you should take aspirin to prevent strokes.  Have regular diabetes screenings. This involves taking a blood sample to check your fasting blood sugar level.  If you are at a normal weight and have a low risk for diabetes, have this test once every three years after 67 years of age.  If you are overweight and  have a high risk for diabetes, consider being tested at a younger age or more often. PREVENTING INFECTION  Hepatitis B  If you have a higher risk for hepatitis B, you should be screened for this virus. You are considered at high risk for hepatitis B if:  You were born in a country where hepatitis B is common. Ask your health care provider which countries are considered high risk.  Your parents were born in a high-risk country, and you have not been immunized against hepatitis B (hepatitis B vaccine).  You have HIV or AIDS.  You use needles to inject street drugs.  You live with someone who has hepatitis B.  You have had sex with someone who has hepatitis B.  You get hemodialysis treatment.  You take certain medicines for conditions, including cancer, organ transplantation, and autoimmune conditions. Hepatitis C  Blood testing is recommended for:  Everyone born from [birth year] through 1963.  Anyone with known risk factors for hepatitis C. Sexually transmitted infections (STIs)  You should be screened for sexually transmitted infections (STIs) including gonorrhea and chlamydia if:  You are sexually active and are younger than 67 years of age.  You are older than 67 years of age and your health care provider tells you that you are at risk for this type of infection.  Your sexual activity has changed since you were last screened and you are at an increased risk for chlamydia or gonorrhea. Ask your health care provider if you are at risk.  If you do not have HIV, but are at risk, it may be recommended that you take a prescription medicine daily to prevent HIV infection. This is called pre-exposure prophylaxis (PrEP). You are considered at risk if:  You are sexually active and do not regularly use condoms or know the HIV status of your partner(s).  You take drugs by injection.  You are sexually active with a partner who has HIV. Talk with your health care provider about whether you  are at high risk of being infected with HIV. If you choose to begin PrEP, you should first be tested for HIV. You should then be tested every 3 months for as long as you are taking PrEP.  PREGNANCY   If you are premenopausal and you may become pregnant, ask your health care provider about preconception counseling.  If you may become pregnant, take 400 to 800 micrograms (mcg) of folic acid every day.  If you want to prevent pregnancy, talk to your health care provider about birth control (contraception). OSTEOPOROSIS AND MENOPAUSE   Osteoporosis is a disease in which the bones lose minerals and strength with aging. This can result in serious bone fractures. Your risk for osteoporosis can be identified using a bone density scan.  If you are 10 years of age or older, or if you are at risk for osteoporosis and fractures, ask your health care provider if you should be screened.  Ask your health care provider whether you should take a calcium or vitamin D supplement to lower your risk for osteoporosis.  Menopause may have certain physical symptoms and risks.  Hormone replacement therapy may reduce some of these symptoms and risks. Talk to your health care provider about whether hormone replacement therapy is right for you.  HOME CARE INSTRUCTIONS   Schedule regular health, dental, and eye exams.  Stay current with your immunizations.   Do not use any tobacco products including cigarettes, chewing tobacco, or electronic cigarettes.  If you are pregnant, do not drink alcohol.  If you are breastfeeding, limit how much and how often you drink alcohol.  Limit alcohol intake to no more than 1 drink per day for nonpregnant women. One drink equals 12 ounces of beer, 5 ounces of wine, or 1 ounces of hard liquor.  Do not use street drugs.  Do not share needles.  Ask your health care provider for help if you need support or information about quitting drugs.  Tell your health care provider if  you often feel depressed.  Tell your health care provider if you have ever been abused or do not feel safe at home. Document Released: 11/10/2010 Document Revised: 09/11/2013 Document Reviewed: 03/29/2013 [facility] Patient Information 2015 [place], [place]. This information is not intended to replace advice given to you by your health care provider. Make sure you discuss any questions you have with your health care provider.

## 2014-11-16 NOTE — Progress Notes (Signed)
Pre visit review using our clinic review tool, if applicable. No additional management support is needed unless otherwise documented below in the visit note. 

## 2014-11-16 NOTE — Assessment & Plan Note (Signed)
Not controlled. Not going to see another endocrinologist after Dr. Welford RochePhadke she reports.   Invokana- yeast infection after 4 days of taking it. Taking metformin and glipizide as prescribed. Since she is having poor control- discussed GLP-1s and upping her glipizide. She would prefer to try twice daily 5 mg and repeat A1c in August. Last A1c 9.1 in May.   Lantus and novolog helpful and taking as Dr. Welford RochePhadke discusses. Will continue to fax me 2 weeks of blood sugars. Will still notify me of lows.

## 2014-11-24 ENCOUNTER — Other Ambulatory Visit: Payer: Self-pay | Admitting: Internal Medicine

## 2014-11-29 ENCOUNTER — Encounter: Payer: Self-pay | Admitting: Nurse Practitioner

## 2014-11-29 NOTE — Assessment & Plan Note (Signed)
Discussed acute and chronic issues. Reviewed health maintenance measures, PFSHx, and immunizations. Obtain routine labs Lipid panel, CBC w/ diff, A1c, and Bmet  Pt refused clinical breast exam and pelvic exam today. Mammogram- 5/16

## 2014-11-29 NOTE — Assessment & Plan Note (Signed)
Will obtain recent Lipid panel to see if diet has helped.

## 2014-11-29 NOTE — Assessment & Plan Note (Signed)
Discussed same things Dr. Welford Roche relayed to the pt.

## 2014-12-06 ENCOUNTER — Other Ambulatory Visit: Payer: Self-pay | Admitting: Nurse Practitioner

## 2014-12-13 ENCOUNTER — Ambulatory Visit: Payer: Commercial Managed Care - PPO | Admitting: Endocrinology

## 2014-12-14 ENCOUNTER — Other Ambulatory Visit (INDEPENDENT_AMBULATORY_CARE_PROVIDER_SITE_OTHER): Payer: Commercial Managed Care - PPO

## 2014-12-14 DIAGNOSIS — E1165 Type 2 diabetes mellitus with hyperglycemia: Secondary | ICD-10-CM | POA: Diagnosis not present

## 2014-12-14 DIAGNOSIS — E785 Hyperlipidemia, unspecified: Secondary | ICD-10-CM

## 2014-12-14 DIAGNOSIS — IMO0001 Reserved for inherently not codable concepts without codable children: Secondary | ICD-10-CM

## 2014-12-14 LAB — BASIC METABOLIC PANEL
BUN: 14 mg/dL (ref 6–23)
CALCIUM: 9.2 mg/dL (ref 8.4–10.5)
CO2: 33 meq/L — AB (ref 19–32)
Chloride: 102 mEq/L (ref 96–112)
Creatinine, Ser: 0.63 mg/dL (ref 0.40–1.20)
GFR: 100.02 mL/min (ref >60.00)
Glucose, Bld: 231 mg/dL — ABNORMAL HIGH (ref 70–99)
POTASSIUM: 4.3 meq/L (ref 3.5–5.1)
SODIUM: 143 meq/L (ref 135–145)

## 2014-12-14 LAB — CBC WITH DIFFERENTIAL/PLATELET
BASOS ABS: 0.1 10*3/uL (ref 0.0–0.1)
Basophils Relative: 0.8 % (ref 0.0–3.0)
EOS ABS: 0.5 10*3/uL (ref 0.0–0.7)
Eosinophils Relative: 6.2 % — ABNORMAL HIGH (ref 0.0–5.0)
HCT: 38.7 % (ref 36.0–46.0)
Hemoglobin: 13 g/dL (ref 12.0–15.0)
Lymphocytes Relative: 26.4 % (ref 12.0–46.0)
Lymphs Abs: 2.2 10*3/uL (ref 0.7–4.0)
MCHC: 33.5 g/dL (ref 30.0–36.0)
MCV: 88.9 fl (ref 78.0–100.0)
Monocytes Absolute: 0.5 10*3/uL (ref 0.1–1.0)
Monocytes Relative: 6.1 % (ref 3.0–12.0)
Neutro Abs: 5 10*3/uL (ref 1.4–7.7)
Neutrophils Relative %: 60.5 % (ref 43.0–77.0)
Platelets: 245 10*3/uL (ref 150.0–400.0)
RBC: 4.35 Mil/uL (ref 3.87–5.11)
RDW: 13.7 % (ref 11.5–15.5)
WBC: 8.2 10*3/uL (ref 4.0–10.5)

## 2014-12-14 LAB — LIPID PANEL
CHOL/HDL RATIO: 4
Cholesterol: 132 mg/dL (ref 0–200)
HDL: 35.9 mg/dL — ABNORMAL LOW (ref >39.00)
LDL CALC: 61 mg/dL (ref 0–99)
NonHDL: 95.89
Triglycerides: 176 mg/dL — ABNORMAL HIGH (ref 0.0–149.0)
VLDL: 35.2 mg/dL (ref 0.0–40.0)

## 2014-12-14 LAB — HEMOGLOBIN A1C: HEMOGLOBIN A1C: 9 % — AB (ref 4.6–6.5)

## 2014-12-19 ENCOUNTER — Encounter: Payer: Commercial Managed Care - PPO | Admitting: Internal Medicine

## 2014-12-21 ENCOUNTER — Other Ambulatory Visit: Payer: Self-pay | Admitting: Nurse Practitioner

## 2014-12-21 MED ORDER — INSULIN GLARGINE 100 UNIT/ML ~~LOC~~ SOLN: SUBCUTANEOUS | Status: DC

## 2015-02-11 ENCOUNTER — Other Ambulatory Visit: Payer: Self-pay | Admitting: Nurse Practitioner

## 2015-02-18 ENCOUNTER — Ambulatory Visit (INDEPENDENT_AMBULATORY_CARE_PROVIDER_SITE_OTHER): Payer: Commercial Managed Care - PPO | Admitting: Nurse Practitioner

## 2015-02-18 VITALS — BP 170/80 | HR 77 | Temp 98.2°F | Resp 14 | Ht 63.0 in | Wt 220.4 lb

## 2015-02-18 DIAGNOSIS — E1165 Type 2 diabetes mellitus with hyperglycemia: Secondary | ICD-10-CM | POA: Diagnosis not present

## 2015-02-18 DIAGNOSIS — E669 Obesity, unspecified: Secondary | ICD-10-CM

## 2015-02-18 DIAGNOSIS — Z91199 Patient's noncompliance with other medical treatment and regimen due to unspecified reason: Secondary | ICD-10-CM

## 2015-02-18 DIAGNOSIS — Z9119 Patient's noncompliance with other medical treatment and regimen: Secondary | ICD-10-CM | POA: Diagnosis not present

## 2015-02-18 DIAGNOSIS — I10 Essential (primary) hypertension: Secondary | ICD-10-CM | POA: Diagnosis not present

## 2015-02-18 DIAGNOSIS — IMO0001 Reserved for inherently not codable concepts without codable children: Secondary | ICD-10-CM

## 2015-02-18 MED ORDER — PHENTERMINE HCL 37.5 MG PO TABS: 37.5000 mg | ORAL_TABLET | Freq: Every day | ORAL | Status: DC

## 2015-02-18 NOTE — Progress Notes (Signed)
Pre visit review using our clinic review tool, if applicable. No additional management support is needed unless otherwise documented below in the visit note. 

## 2015-02-18 NOTE — Progress Notes (Signed)
Patient ID: Jamie Strickland, female    DOB: 11-03-1945  Age: 69 y.o. MRN: [id number]  CC: Follow-up   HPI Jamie Strickland presents for 3 month follow up of DM type II and hyperlipidemia.  1) DM- Metformin twice daily 2,000 mg total   Lantus 60 units bedtime  Novolog- 20-30 units sliding scale 3 times daily   Glucotrol 5 mg  253 this morning   2) HTN- lisinopril- did not take this morning. She is a night person and wakes up around 8:30 pm and goes to sleep in the morning.   3) HLD- Lipitor 20 mg, needs to work on diet and exercise. Discussed upping dosage- pt is not interested.   Diet- Carbohydrates increased due to stress Exercise- No formal. [facility] and pool are closed when she is awake at night.   Stressors- Surveyor, quantity, she reports having to pay 300 dollars every time we get labs and have a visit. Her son was in the hospital after a bad drug deal and he is addicted to pain killers. He moved to [place] and she still worries. She is also adamantly refusing to come every 3 months due to cost.   History Jamie Strickland has a past medical history of Allergic rhinitis; colonic polyps; Diabetes mellitus type II; Hyperlipidemia; Hypertension; and Osteoarthritis.   She has past surgical history that includes Cholecystectomy; LASIK; Cataract extraction w/ intraocular lens  implant, bilateral (Bilateral, 2014); Cataract extraction w/ intraocular lens  implant, bilateral (11/14); and Abdominal hysterectomy (1972).   Her family history includes Asthma (age of onset: 11) in her father; Cancer in her maternal aunt; Diabetes in her father and mother; Heart disease in her brother, father, maternal grandfather, and mother; Liver disease in her father.She reports that she has quit smoking. She quit smokeless tobacco use about 6 years ago. She reports that she drinks alcohol. She reports that she does not use illicit drugs.  Outpatient Prescriptions Prior to Visit  Medication Sig Dispense Refill  .  ACCU-CHEK SMARTVIEW test strip TEST BLOOD SUGAR TWICE DAILY AS NEEDED 100 each 1  . albuterol (VENTOLIN HFA) 108 (90 BASE) MCG/ACT inhaler Inhale 2 puffs into the lungs as needed for wheezing or shortness of breath. Inhale 2 puffs four times daily as needed for asthma 1 Inhaler 2  . aspirin EC 81 MG tablet Take 81 mg by mouth daily.    Marland Kitchen atorvastatin (LIPITOR) 20 MG tablet Take 1 tablet (20 mg total) by mouth daily. 90 tablet 1  . B-D ULTRAFINE III SHORT PEN 31G X 8 MM MISC USE AS DIRECTED 100 each 0  . glipiZIDE (GLUCOTROL) 5 MG tablet TAKE 1 TABLET BY MOUTH TWICE DAILY WITH MEALS 180 tablet 1  . insulin aspart (NOVOLOG) 100 UNIT/ML injection Inject three times daily Based on sliding scale between 20-30 units. MDD80 units 20 mL 3  . insulin glargine (LANTUS) 100 UNIT/ML injection Inject 60 units [place] at bedtime 10 mL 3  . Insulin Syringe-Needle U-100 (INSULIN SYRINGE .5CC/30GX5/16") 30G X 5/16" 0.5 ML MISC USE 3-4 TIMES DAILY 300 each 6  . lisinopril (PRINIVIL,ZESTRIL) 20 MG tablet Take 1 tablet (20 mg total) by mouth daily. 90 tablet 1  . metFORMIN (GLUCOPHAGE) 1000 MG tablet TAKE 1 TABLET BY MOUTH TWICE DAILY WITH A MEAL 180 tablet 0  . salmeterol (SEREVENT) 50 MCG/DOSE diskus inhaler Inhale 1 puff into the lungs 2 (two) times daily. 1 Inhaler 11  . metFORMIN (GLUCOPHAGE) 1000 MG tablet TAKE 1 TABLET BY MOUTH TWICE  DAILY WITH A MEAL 60 tablet 0   No facility-administered medications prior to visit.    ROS Review of Systems  Constitutional: Negative for fever, chills, diaphoresis and fatigue.  Eyes: Negative for visual disturbance.  Respiratory: Negative for chest tightness, shortness of breath and wheezing.   Cardiovascular: Negative for chest pain, palpitations and leg swelling.  Gastrointestinal: Negative for nausea, vomiting and diarrhea.  Endocrine: Negative for polydipsia, polyphagia and polyuria.  Skin: Negative for rash.  Neurological: Negative for dizziness, weakness, numbness and  headaches.  Psychiatric/Behavioral: The patient is not nervous/anxious.     Objective:  BP 170/80 mmHg  Pulse 77  Temp(Src) 98.2 F (36.8 C)  Resp 14  Ht  (1.6 m)  Wt 220 lb 6.4 oz (99.973 kg)  BMI 39.05 kg/m2  SpO2 96%  Physical Exam  Constitutional: She is oriented to person, place, and time. She appears well-developed and well-nourished. No distress.  HENT:  Head: Normocephalic and atraumatic.  Right Ear: External ear normal.  Left Ear: External ear normal.  Cardiovascular: Normal rate, regular rhythm and normal heart sounds.  Exam reveals no gallop and no friction rub.   No murmur heard. Pulmonary/Chest: Effort normal and breath sounds normal. No respiratory distress. She has no wheezes. She has no rales. She exhibits no tenderness.  Abdominal:  Obese  Neurological: She is alert and oriented to person, place, and time. No cranial nerve deficit. She exhibits normal muscle tone. Coordination normal.  Skin: Skin is warm and dry. No rash noted. She is not diaphoretic.  Psychiatric: She has a normal mood and affect. Her behavior is normal. Judgment and thought content normal.   Assessment & Plan:   Jamie Strickland was seen today for follow-up.  Diagnoses and all orders for this visit:  Obesity  Uncontrolled type 2 diabetes mellitus without complication, without long-term current use of insulin (HCC)  Essential hypertension, benign  Medically noncompliant  Other orders -     phentermine (ADIPEX-P) 37.5 MG tablet; Take 1 tablet (37.5 mg total) by mouth daily before breakfast.  I am having [name] start on phentermine. I am also having her maintain her aspirin EC, INSULIN SYRINGE .5CC/30GX5/16", insulin aspart, lisinopril, atorvastatin, salmeterol, albuterol, glipiZIDE, metFORMIN, ACCU-CHEK SMARTVIEW, insulin glargine, and B-D ULTRAFINE III SHORT PEN.  Meds ordered this encounter  Medications  . phentermine (ADIPEX-P) 37.5 MG tablet    Sig: Take 1 tablet (37.5 mg total)  by mouth daily before breakfast.    Dispense:  30 tablet    Refill:  1    Order Specific Question:  Supervising Provider    Answer:  Sherlene Shams [[id number]]     Follow-up: Return in about 6 months (around 08/19/2015).

## 2015-02-18 NOTE — Patient Instructions (Signed)
Please have your vital signs checked a week after starting and let me know the results.  I will need those measurements, or you can return for an RN visit, before I can refill the phentermine for additional months.  If pulse is > 100 or BP is > 140/80 we should repeat your evaluation after being off  of phentermine for a few days.  If both are below those parameters. Most people take 1/2 tablet in the morning.

## 2015-02-20 ENCOUNTER — Encounter: Payer: Self-pay | Admitting: Nurse Practitioner

## 2015-02-20 DIAGNOSIS — Z91199 Patient's noncompliance with other medical treatment and regimen due to unspecified reason: Secondary | ICD-10-CM | POA: Insufficient documentation

## 2015-02-20 DIAGNOSIS — Z9119 Patient's noncompliance with other medical treatment and regimen: Secondary | ICD-10-CM | POA: Insufficient documentation

## 2015-02-20 NOTE — Assessment & Plan Note (Signed)
Patient is still on the same regimen as previous. Her blood sugars are still not being controlled to 53 this morning. She seems to be very resistant to changes of anything and not wanting to work on diet or exercise. She is concerned about financial issues. She has declined to see me every 3 months, declined other medications due to expense, and noncompliant with previous recommendations.

## 2015-02-20 NOTE — Assessment & Plan Note (Signed)
She has not taken her lisinopril as of yet today. Asked her to take her medication and be compliant. We'll follow-up BP Readings from Last 3 Encounters:  02/18/15 170/80  11/16/14 148/72  09/13/14 150/88

## 2015-02-20 NOTE — Assessment & Plan Note (Signed)
Patient has refused multiple options for help with diabetes, financial matters, and diet and exercise regimens. She has agreed to try phentermine if the cost is not too high. She will also refused to follow-up in 3 months and wants to follow-up in 6 months despite worsening blood sugars. She states she will fax us 2 weeks worth of blood sugars so I can titrate her insulin.

## 2015-02-20 NOTE — Assessment & Plan Note (Addendum)
Patient is not working on diet or exercise currently because she reports she is to stress out thinking about her son who is addicted to drugs. He currently resides in [place]. She reports she is "just eating anything and everything". She is also staying up at night and sleeping during the day and the gym at her complex is not open at nighttime. We'll do trial of phentermine.

## 2015-02-22 ENCOUNTER — Other Ambulatory Visit: Payer: Self-pay

## 2015-02-22 MED ORDER — INSULIN ASPART 100 UNIT/ML ~~LOC~~ SOLN: SUBCUTANEOUS | Status: DC

## 2015-03-03 ENCOUNTER — Other Ambulatory Visit: Payer: Self-pay | Admitting: Nurse Practitioner

## 2015-03-14 ENCOUNTER — Other Ambulatory Visit: Payer: Self-pay | Admitting: Nurse Practitioner

## 2015-03-15 ENCOUNTER — Other Ambulatory Visit: Payer: Self-pay | Admitting: Nurse Practitioner

## 2015-03-29 ENCOUNTER — Other Ambulatory Visit: Payer: Self-pay | Admitting: Nurse Practitioner

## 2015-04-15 ENCOUNTER — Other Ambulatory Visit: Payer: Self-pay | Admitting: Internal Medicine

## 2015-04-17 NOTE — Telephone Encounter (Signed)
CARRIE'S PATIENT

## 2015-05-20 ENCOUNTER — Other Ambulatory Visit: Payer: Self-pay | Admitting: Internal Medicine

## 2015-05-23 ENCOUNTER — Other Ambulatory Visit: Payer: Self-pay | Admitting: Nurse Practitioner

## 2015-05-23 NOTE — Telephone Encounter (Signed)
Please advise refill on medications.

## 2015-05-27 ENCOUNTER — Other Ambulatory Visit: Payer: Self-pay | Admitting: Nurse Practitioner

## 2015-05-27 ENCOUNTER — Other Ambulatory Visit: Payer: Self-pay

## 2015-05-27 ENCOUNTER — Telehealth: Payer: Self-pay | Admitting: *Deleted

## 2015-05-27 MED ORDER — GLUCOSE BLOOD VI STRP: ORAL_STRIP | Status: DC

## 2015-05-27 NOTE — Telephone Encounter (Signed)
Patient has requested a Rx for test strips( accu check). She stated she was advised by Naomie Deanarrie Doss NP to test 4x a day. Pharmacy [facility] [address]

## 2015-06-11 ENCOUNTER — Other Ambulatory Visit: Payer: Self-pay | Admitting: Internal Medicine

## 2015-06-11 ENCOUNTER — Other Ambulatory Visit: Payer: Self-pay | Admitting: Nurse Practitioner

## 2015-06-23 ENCOUNTER — Other Ambulatory Visit: Payer: Self-pay | Admitting: Nurse Practitioner

## 2015-06-24 NOTE — Telephone Encounter (Signed)
Pt requesting a refill on Phentermine. Pt last Ov 02/18/15, Last filled 05/24/15 #30tabs with 0refills. Please advise

## 2015-07-04 ENCOUNTER — Other Ambulatory Visit: Payer: Self-pay | Admitting: Nurse Practitioner

## 2015-07-05 ENCOUNTER — Telehealth: Payer: Self-pay

## 2015-07-05 NOTE — Telephone Encounter (Signed)
Ask her to call her insurance and see what is cheaper. We don't know about her specific formulary. Thanks!

## 2015-07-05 NOTE — Telephone Encounter (Signed)
Patient came in the office, she is requesting a change in her insulin.  Currently takes Lantus, she went to the pharmacy to pick it up and the cost went from $10 to $110.  She normally uses a coupon and we don't have any.  She has three days of medicine left, please advise a change to a cheaper medication.  thanks

## 2015-07-05 NOTE — Telephone Encounter (Signed)
Left the patient a detailed VM to call her insurance to see what options are cheaper.  thanks

## 2015-07-07 ENCOUNTER — Other Ambulatory Visit: Payer: Self-pay | Admitting: Nurse Practitioner

## 2015-07-07 MED ORDER — INSULIN DETEMIR 100 UNIT/ML ~~LOC~~ SOLN: 60.0000 [IU] | Freq: Every day | SUBCUTANEOUS | Status: DC

## 2015-07-08 ENCOUNTER — Telehealth: Payer: Self-pay

## 2015-07-08 NOTE — Telephone Encounter (Signed)
-----   Message from Carollee Leitz, NP sent at 07/07/2015  8:26 PM EST ----- I have sent in Levemir to her pharmacy. Same amount- 60 units at bedtime. This may be a cheaper option and [employer] shows it as a tier 1 if this is the insurance she still has per our info in the chart. Thanks!

## 2015-07-08 NOTE — Telephone Encounter (Signed)
Called patient and she will pick up the medication today.  thanks

## 2015-08-01 ENCOUNTER — Other Ambulatory Visit: Payer: Self-pay | Admitting: Nurse Practitioner

## 2015-08-01 ENCOUNTER — Other Ambulatory Visit: Payer: Self-pay | Admitting: Internal Medicine

## 2015-08-02 ENCOUNTER — Other Ambulatory Visit: Payer: Self-pay | Admitting: Nurse Practitioner

## 2015-08-19 ENCOUNTER — Encounter: Payer: Self-pay | Admitting: Nurse Practitioner

## 2015-08-19 ENCOUNTER — Telehealth: Payer: Self-pay | Admitting: *Deleted

## 2015-08-19 ENCOUNTER — Ambulatory Visit (INDEPENDENT_AMBULATORY_CARE_PROVIDER_SITE_OTHER): Payer: Commercial Managed Care - PPO | Admitting: Nurse Practitioner

## 2015-08-19 ENCOUNTER — Other Ambulatory Visit: Payer: Self-pay | Admitting: Nurse Practitioner

## 2015-08-19 VITALS — BP 140/84 | HR 83 | Temp 97.6°F | Ht 63.0 in | Wt 225.0 lb

## 2015-08-19 DIAGNOSIS — E1165 Type 2 diabetes mellitus with hyperglycemia: Secondary | ICD-10-CM | POA: Diagnosis not present

## 2015-08-19 DIAGNOSIS — J302 Other seasonal allergic rhinitis: Secondary | ICD-10-CM

## 2015-08-19 DIAGNOSIS — E113299 Type 2 diabetes mellitus with mild nonproliferative diabetic retinopathy without macular edema, unspecified eye: Secondary | ICD-10-CM

## 2015-08-19 DIAGNOSIS — IMO0001 Reserved for inherently not codable concepts without codable children: Secondary | ICD-10-CM

## 2015-08-19 DIAGNOSIS — I10 Essential (primary) hypertension: Secondary | ICD-10-CM | POA: Diagnosis not present

## 2015-08-19 LAB — COMPREHENSIVE METABOLIC PANEL
ALBUMIN: 4.2 g/dL (ref 3.5–5.2)
ALT: 39 U/L — AB (ref 0–35)
AST: 21 U/L (ref 0–37)
Alkaline Phosphatase: 63 U/L (ref 39–117)
BILIRUBIN TOTAL: 0.3 mg/dL (ref 0.2–1.2)
BUN: 23 mg/dL (ref 6–23)
CALCIUM: 10 mg/dL (ref 8.4–10.5)
CO2: 29 mEq/L (ref 19–32)
CREATININE: 0.69 mg/dL (ref 0.40–1.20)
Chloride: 100 mEq/L (ref 96–112)
GFR: 89.87 mL/min (ref >60.00)
Glucose, Bld: 286 mg/dL — ABNORMAL HIGH (ref 70–99)
Potassium: 4.4 mEq/L (ref 3.5–5.1)
Sodium: 138 mEq/L (ref 135–145)
Total Protein: 7.4 g/dL (ref 6.0–8.3)

## 2015-08-19 LAB — MICROALBUMIN / CREATININE URINE RATIO
CREATININE, U: 82.9 mg/dL
MICROALB UR: 1.2 mg/dL (ref 0.0–1.9)
Microalb Creat Ratio: 1.4 mg/g (ref 0.0–30.0)

## 2015-08-19 LAB — HEMOGLOBIN A1C: Hgb A1c MFr Bld: 9.8 % — ABNORMAL HIGH (ref 4.6–6.5)

## 2015-08-19 NOTE — Assessment & Plan Note (Signed)
BP Readings from Last 3 Encounters:  08/19/15 140/84  02/18/15 170/80  11/16/14 148/72   Stable currently continue current regimen

## 2015-08-19 NOTE — Progress Notes (Signed)
Pre-visit discussion using our clinic review tool. No additional management support is needed unless otherwise documented below in the visit note.  

## 2015-08-19 NOTE — Telephone Encounter (Signed)
If she will google Levemir Savings cards- Goodrx or the company are good options to help lower her monthly copay.

## 2015-08-19 NOTE — Assessment & Plan Note (Signed)
Stable currently with over-the-counter allergy medications

## 2015-08-19 NOTE — Telephone Encounter (Signed)
FYI Patient wanted Jamie Strickland to know that, Levemir is the cheapest insulin.

## 2015-08-19 NOTE — Telephone Encounter (Signed)
FYI

## 2015-08-19 NOTE — Patient Instructions (Addendum)
Please call your insurance company and let us know what they will cover of Basal Insulin.   See you in 6 months!

## 2015-08-19 NOTE — Assessment & Plan Note (Signed)
Checking C met, A1c, urine microalbumin today Patient is stating that the Lantus and the Levemir were both expensive Asked her to contact her insurance company for further guidance Will titrate and change after she calls with results FU in 6 months Encouraged her to keep up good exercise and eating healthy

## 2015-08-19 NOTE — Assessment & Plan Note (Signed)
Patient is stable currently gets yearly eye exams

## 2015-08-19 NOTE — Progress Notes (Signed)
Patient ID: Jamie Strickland, female    DOB: 1946-03-15  Age: 70 y.o. MRN: [id number]  CC: No chief complaint on file.   HPI Jamie Strickland presents for follow up of DM, HTN, Allergies.  1) DM- 200-300   60 units in the morning before bed  Glipizide twice daily  Metformin 1000 units twice daily  Novolog- 24 units with meals yesterday  182 lowest blood sugars recently   Swimming 3 x a week 1 hour each time   Diet- no fried, smaller portions, decreased meat intake   2) Falls- Neg Depression- Neg  3) Allergies- pollen, OTC allergy medication   History Jamie Strickland has a past medical history of Allergic rhinitis; colonic polyps; Diabetes mellitus type II; Hyperlipidemia; Hypertension; and Osteoarthritis.   She has past surgical history that includes Cholecystectomy; LASIK; Cataract extraction w/ intraocular lens  implant, bilateral (Bilateral, 2014); Cataract extraction w/ intraocular lens  implant, bilateral (11/14); and Abdominal hysterectomy (1972).   Her family history includes Asthma (age of onset: 14) in her father; Cancer in her maternal aunt; Diabetes in her father and mother; Heart disease in her brother, father, maternal grandfather, and mother; Liver disease in her father.She reports that she has quit smoking. She quit smokeless tobacco use about 7 years ago. She reports that she drinks alcohol. She reports that she does not use illicit drugs.  Outpatient Prescriptions Prior to Visit  Medication Sig Dispense Refill  . albuterol (VENTOLIN HFA) 108 (90 BASE) MCG/ACT inhaler Inhale 2 puffs into the lungs as needed for wheezing or shortness of breath. Inhale 2 puffs four times daily as needed for asthma 1 Inhaler 2  . aspirin EC 81 MG tablet Take 81 mg by mouth daily.    Marland Kitchen atorvastatin (LIPITOR) 20 MG tablet TAKE 1 TABLET(20 MG) BY MOUTH DAILY 90 tablet 2  . B-D ULTRAFINE III SHORT PEN 31G X 8 MM MISC USE AS DIRECTED 100 each 0  . glipiZIDE (GLUCOTROL) 5 MG tablet TAKE 1 TABLET BY MOUTH  TWICE DAILY WITH MEALS 180 tablet 0  . glucose blood (ACCU-CHEK SMARTVIEW) test strip TEST BLOOD SUGAR FOUR TIMES DAILY AS NEEDED 450 each 3  . insulin detemir (LEVEMIR) 100 UNIT/ML injection Inject 0.6 mLs (60 Units total) into the skin at bedtime. 10 mL 11  . Insulin Syringe-Needle U-100 (INSULIN SYRINGE .5CC/30GX5/16") 30G X 5/16" 0.5 ML MISC USE TO INJECT 3 TO 4 TIMES DAILY 300 each 0  . lisinopril (PRINIVIL,ZESTRIL) 20 MG tablet TAKE 1 TABLET(20 MG) BY MOUTH DAILY 90 tablet 0  . metFORMIN (GLUCOPHAGE) 1000 MG tablet TAKE 1 TABLET BY MOUTH TWICE DAILY WITH A MEAL 180 tablet 3  . NOVOLOG 100 UNIT/ML injection INJECT 20 TO 30 UNITS BASED ON SLIDING SCALE UNDER THE SKIN THREE TIMES DAILY 20 mL 0  . phentermine (ADIPEX-P) 37.5 MG tablet TAKE 1 TABLET BY MOUTH EVERY DAY BEFORE BREAKFAST 30 tablet 0  . salmeterol (SEREVENT) 50 MCG/DOSE diskus inhaler Inhale 1 puff into the lungs 2 (two) times daily. 1 Inhaler 11  . LANTUS SOLOSTAR 100 UNIT/ML Solostar Pen INJECT 60 UNITS UNDER THE SKIN AT BEDTIME (Patient not taking: Reported on 08/19/2015) 15 mL 5  . LANTUS SOLOSTAR 100 UNIT/ML Solostar Pen ADMINISTER 60 UNITS UNDER THE SKIN AT BEDTIME (Patient not taking: Reported on 08/19/2015) 15 mL 5   No facility-administered medications prior to visit.    ROS Review of Systems  Constitutional: Negative for fever, chills, diaphoresis and fatigue.  Respiratory: Negative for chest tightness,  shortness of breath and wheezing.   Cardiovascular: Negative for chest pain, palpitations and leg swelling.  Gastrointestinal: Negative for nausea, vomiting and diarrhea.  Skin: Negative for rash.  Neurological: Negative for dizziness, weakness, numbness and headaches.  Psychiatric/Behavioral: The patient is not nervous/anxious.     Objective:  BP 140/84 mmHg  Pulse 83  Temp(Src) 97.6 F (36.4 C) (Oral)  Ht '5\' 3"'  (1.6 m)  Wt 225 lb (102.059 kg)  BMI 39.87 kg/m2  SpO2 96%  Physical Exam  Constitutional: She is  oriented to person, place, and time. She appears well-developed and well-nourished. No distress.  HENT:  Head: Normocephalic and atraumatic.  Right Ear: External ear normal.  Left Ear: External ear normal.  Eyes: Right eye exhibits no discharge. Left eye exhibits no discharge. No scleral icterus.  Cardiovascular: Normal rate, regular rhythm and intact distal pulses.   Pulmonary/Chest: Effort normal and breath sounds normal. No respiratory distress. She has no wheezes. She has no rales. She exhibits no tenderness.  Neurological: She is alert and oriented to person, place, and time. No cranial nerve deficit. She exhibits normal muscle tone. Coordination normal.  Monofilament foot exam 10/10 bilaterally   Skin: Skin is warm and dry. No rash noted. She is not diaphoretic.  Fungal changes to large toenails No wounds apparent on feet bilaterally  Psychiatric: She has a normal mood and affect. Her behavior is normal. Judgment and thought content normal.   Assessment & Plan:   Diagnoses and all orders for this visit:  Essential hypertension, benign -     HgB A1c -     Comp Met (CMET) -     Urine Microalbumin w/creat. 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 ratio  Uncontrolled type 2 diabetes mellitus without complication, without long-term current use of insulin (HCC) -     HgB A1c -     Comp Met (CMET) -     Urine Microalbumin w/creat. ratio  Other seasonal allergic rhinitis   I have discontinued Ms. Jamie Strickland's LANTUS SOLOSTAR and LANTUS SOLOSTAR. I am also having her maintain her aspirin EC, salmeterol, albuterol, metFORMIN, lisinopril, glucose blood, atorvastatin, B-D ULTRAFINE III SHORT PEN, phentermine, glipiZIDE, insulin detemir, NOVOLOG, and INSULIN SYRINGE .5CC/30GX5/16".  No orders of the defined types were placed in this  encounter.     Follow-up: Return in about 6 months (around 02/18/2016) for Follow up.

## 2015-08-22 ENCOUNTER — Other Ambulatory Visit: Payer: Self-pay | Admitting: Nurse Practitioner

## 2015-08-22 MED ORDER — GLIPIZIDE 10 MG PO TABS: 10.0000 mg | ORAL_TABLET | Freq: Two times a day (BID) | ORAL | Status: DC

## 2015-08-29 ENCOUNTER — Other Ambulatory Visit: Payer: Self-pay | Admitting: Nurse Practitioner

## 2015-09-01 ENCOUNTER — Other Ambulatory Visit: Payer: Self-pay | Admitting: Nurse Practitioner

## 2015-10-11 ENCOUNTER — Other Ambulatory Visit: Payer: Self-pay | Admitting: Family Medicine

## 2015-10-11 NOTE — Telephone Encounter (Signed)
No longer on pt list and as not been ordered since 2012. Please advise?

## 2015-10-25 ENCOUNTER — Encounter: Payer: Self-pay | Admitting: Family Medicine

## 2015-10-25 ENCOUNTER — Ambulatory Visit (INDEPENDENT_AMBULATORY_CARE_PROVIDER_SITE_OTHER): Payer: Commercial Managed Care - PPO | Admitting: Family Medicine

## 2015-10-25 VITALS — BP 160/82 | HR 83 | Temp 97.9°F | Ht 63.0 in | Wt 224.4 lb

## 2015-10-25 DIAGNOSIS — E1165 Type 2 diabetes mellitus with hyperglycemia: Secondary | ICD-10-CM

## 2015-10-25 DIAGNOSIS — Z794 Long term (current) use of insulin: Secondary | ICD-10-CM

## 2015-10-25 DIAGNOSIS — E11319 Type 2 diabetes mellitus with unspecified diabetic retinopathy without macular edema: Secondary | ICD-10-CM | POA: Diagnosis not present

## 2015-10-25 DIAGNOSIS — E785 Hyperlipidemia, unspecified: Secondary | ICD-10-CM

## 2015-10-25 DIAGNOSIS — I10 Essential (primary) hypertension: Secondary | ICD-10-CM | POA: Diagnosis not present

## 2015-10-25 MED ORDER — INSULIN DETEMIR 100 UNIT/ML ~~LOC~~ SOLN: 35.0000 [IU] | Freq: Two times a day (BID) | SUBCUTANEOUS | Status: DC

## 2015-10-25 MED ORDER — INSULIN ASPART 100 UNIT/ML ~~LOC~~ SOLN: SUBCUTANEOUS | Status: DC

## 2015-10-25 NOTE — Progress Notes (Signed)
Subjective:  Patient ID: Jamie Strickland, female    DOB: [birth year]  Age: 70 y.o. MRN: [id number]  CC: Follow up  HPI:  70 year old female with uncontrolled DM-2 w/ complications, HTN, HLD, Obesity presents for follow up.  DM  Blood sugars readings - All readings >200.  Hypoglycemia - No.  Medications - Levemir 60 units, Novolog 25 units with meals, Metformin. Patient did not endorse being on Glipizide.  Adverse effects - None.  Compliance - Endorses compliance. Preventative care  Eye exam - scheduled for next Friday.  Foot exam - Up to date.  Last A1C - Up to date.   Urine microalbumin - On ACEI.  On Aspirin and Statin.  HTN  Has been stable.  BP elevated today.  On Lisinopril 20 mg daily. Endorses compliance.  HLD  Well controlled on Lipitor.  No reported side effects.  Social Hx   Social History   Social History  . Marital Status: Single    Spouse Name: N/A  . Number of Children: 2  . Years of Education: N/A   Occupational History  . 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 Smoking status: Former Games developermoker  . Smokeless tobacco: Former NeurosurgeonUser    Quit date: 08/09/2008  . Alcohol Use: 0.0 oz/week    0 Standard drinks or equivalent per week     Comment: Rare  . Drug Use: No  . Sexual Activity: Not Currently   Other Topics Concern  . None   Social History Narrative   No living will   Son, Lorin PicketScott, is health care POA   2 sons- Molli HazardMatthew and Lorin PicketScott ([id number] and [id number])    4 grandchildren and 6 great-grandchildren   1 dog lives inside   Right handed    Caffeine- 1 soda-diet, 1 cup of coffee   Discussed DNR and she wants this. Done 10/17/12   No tube feeds if cognitively unaware   Review of Systems  Constitutional: Negative.   Respiratory: Negative.   Cardiovascular: Negative.    Objective:  BP 160/82 mmHg  Pulse 83  Temp(Src) 97.9 F (36.6 C) (Oral)  Ht 5\' 3"  (1.6 m)  Wt 224 lb 6 oz (101.776 kg)  BMI 39.76 kg/m2  SpO2  93%  BP/Weight 10/25/2015 08/19/2015 02/18/2015  Systolic BP 160 140 170  Diastolic BP 82 84 80  Wt. (Lbs) 224.38 225 220.4  BMI 39.76 39.87 39.05   Physical Exam  Constitutional: She is oriented to person, place, and time. She appears well-developed. No distress.  Cardiovascular: Normal rate, regular rhythm and intact distal pulses.   Pulmonary/Chest: Effort normal and breath sounds normal. She has no wheezes. She has no rales.  Abdominal: Soft. She exhibits no distension. There is no tenderness. There is no rebound and no guarding.  Neurological: She is alert and oriented to person, place, and time.  Psychiatric:  Flat affect.  Vitals reviewed.  Lab Results  Component Value Date   WBC 8.2 12/14/2014   HGB 13.0 12/14/2014   HCT 38.7 12/14/2014   PLT 245.0 12/14/2014   GLUCOSE 286* 08/19/2015   CHOL 132 12/14/2014   TRIG 176.0* 12/14/2014   HDL 35.90* 12/14/2014   LDLDIRECT 58.4 10/17/2012   LDLCALC 61 12/14/2014   ALT 39* 08/19/2015   AST 21 08/19/2015   NA 138 08/19/2015   K 4.4 08/19/2015   CL 100 08/19/2015   CREATININE 0.69 08/19/2015   BUN 23  08/19/2015   CO2 29 08/19/2015   TSH 0.83 10/17/2012   HGBA1C 9.8* 08/19/2015   MICROALBUR 1.2 08/19/2015   Assessment & Plan:   Problem List Items Addressed This Visit    DM type 2, uncontrolled, with retinopathy (HCC) - Primary    Remains uncontrolled. Patient does not seem to know her medication regimen well.  Has refused to come in more frequently. Refused to see endo. Increasing Levemir to 35 units BID. Continue 25 units of Novolog with meals. Continue Metformin.      Relevant Medications   insulin detemir (LEVEMIR) 100 UNIT/ML injection   insulin aspart (NOVOLOG) 100 UNIT/ML injection   Essential hypertension, benign    Not at goal today but she has not taken her medication. Will continue Lisinopril. Will follow closely.      Hyperlipemia    Well controlled on Lipitor. Will continue.         Meds  ordered this encounter  Medications  . insulin detemir (LEVEMIR) 100 UNIT/ML injection    Sig: Inject 0.35 mLs (35 Units total) into the skin 2 (two) times daily.    Dispense:  10 mL    Refill:  11    Dispense 90 day supply  . insulin aspart (NOVOLOG) 100 UNIT/ML injection    Sig: 25 units with each meal.    Dispense:  60 mL    Refill:  1    Please dispense a 90 day supply.    Follow-up: Recommended 3 months; Patient stated that it has to be every 6 months.  Everlene Other DO [facility]

## 2015-10-25 NOTE — Assessment & Plan Note (Addendum)
Remains uncontrolled. Patient does not seem to know her medication regimen well.  Has refused to come in more frequently. Refused to see endo. Increasing Levemir to 35 units BID. Continue 25 units of Novolog with meals. Continue Metformin.

## 2015-10-25 NOTE — Progress Notes (Signed)
Pre visit review using our clinic review tool, if applicable. No additional management support is needed unless otherwise documented below in the visit note. 

## 2015-10-25 NOTE — Patient Instructions (Signed)
Increase your Levemir. You'll need to split the dose (as it does not absorb well after 60 units). 35 units twice daily. Continue the rapid acting insulin as you currently do.  You should consider seeing Endocrinology.  Follow up in 3 months.  Take care  Dr. Adriana Simasook

## 2015-10-25 NOTE — Assessment & Plan Note (Signed)
Not at goal today but she has not taken her medication. Will continue Lisinopril. Will follow closely.

## 2015-10-25 NOTE — Assessment & Plan Note (Signed)
Well controlled on Lipitor. Will continue. 

## 2015-11-11 ENCOUNTER — Telehealth: Payer: Self-pay | Admitting: *Deleted

## 2015-11-11 MED ORDER — INSULIN DETEMIR 100 UNIT/ML ~~LOC~~ SOLN: 35.0000 [IU] | Freq: Two times a day (BID) | SUBCUTANEOUS | Status: DC

## 2015-11-11 MED ORDER — GLUCOSE BLOOD VI STRP: ORAL_STRIP | Status: DC

## 2015-11-11 NOTE — Telephone Encounter (Signed)
Refills sent to pharmacy. thanks

## 2015-11-11 NOTE — Telephone Encounter (Signed)
Patient stated that she was advised to take 70 units of Levemir,dure to dosage increasing she will be out of this medication and requested a refill. She will also need a refill for test strips for a accu check smart view .

## 2015-12-02 ENCOUNTER — Telehealth: Payer: Self-pay | Admitting: Family Medicine

## 2015-12-02 NOTE — Telephone Encounter (Signed)
Pt dropped off Proof of Physical form to be completed by Dr. Adriana Simas. Paper will be placed in Dr. Patsey Berthold box.

## 2015-12-03 NOTE — Telephone Encounter (Signed)
Tried calling patient to let her know form was ready but her voicemail had not been set up yet.

## 2015-12-03 NOTE — Telephone Encounter (Signed)
Pt returned call, she was informed of the previous statement, and will pick forms up in the morning.

## 2015-12-03 NOTE — Telephone Encounter (Signed)
Paperwork placed in providers blue quick sign folder.

## 2015-12-03 NOTE — Telephone Encounter (Signed)
Called patient a second time and am unable to leave voicemail. Her paperwork is at the front desk. In folder.

## 2015-12-03 NOTE — Telephone Encounter (Signed)
Form filled out

## 2015-12-10 ENCOUNTER — Other Ambulatory Visit: Payer: Self-pay | Admitting: Family Medicine

## 2015-12-10 ENCOUNTER — Other Ambulatory Visit: Payer: Self-pay | Admitting: Nurse Practitioner

## 2015-12-11 ENCOUNTER — Telehealth: Payer: Self-pay | Admitting: *Deleted

## 2015-12-11 MED ORDER — FLUTICASONE-SALMETEROL 250-50 MCG/DOSE IN AEPB: 1.0000 | INHALATION_SPRAY | Freq: Two times a day (BID) | RESPIRATORY_TRACT | 0 refills | Status: DC

## 2015-12-11 NOTE — Telephone Encounter (Signed)
Patient requested a Rx refill for advair diskus

## 2015-12-11 NOTE — Telephone Encounter (Signed)
Refilled rx. thanks

## 2016-01-29 ENCOUNTER — Telehealth: Payer: Self-pay | Admitting: Family Medicine

## 2016-01-29 NOTE — Telephone Encounter (Signed)
Pt was called back but was unable to leave a voicemail due to voicemail not being set up.

## 2016-01-29 NOTE — Telephone Encounter (Signed)
Pt lvm. Very vulgar Stated that she needs to speak to somebody and why is it so "GD" hard to get someone. She did not state what it was about.

## 2016-01-31 ENCOUNTER — Other Ambulatory Visit: Payer: Self-pay | Admitting: Family Medicine

## 2016-01-31 NOTE — Telephone Encounter (Signed)
Will inform Harriett Sineancy of patient's concerns and foul language.

## 2016-01-31 NOTE — Telephone Encounter (Signed)
Pt was called and asked how she was doing. Pt stated she was okay she went to a walk- in clinic for her symptoms of sore throat.  Pt was told that the message that she left on office voicemail of her cussing was not appropriate and the issues with the phone would be addressed.

## 2016-02-03 NOTE — Telephone Encounter (Signed)
Thank you for the follow-up and documentation.

## 2016-02-19 ENCOUNTER — Ambulatory Visit: Payer: Commercial Managed Care - PPO | Admitting: Nurse Practitioner

## 2016-02-26 ENCOUNTER — Other Ambulatory Visit: Payer: Self-pay | Admitting: Nurse Practitioner

## 2016-03-26 ENCOUNTER — Other Ambulatory Visit: Payer: Self-pay | Admitting: Nurse Practitioner

## 2016-03-31 ENCOUNTER — Telehealth: Payer: Self-pay | Admitting: *Deleted

## 2016-03-31 ENCOUNTER — Other Ambulatory Visit: Payer: Self-pay | Admitting: Family Medicine

## 2016-03-31 NOTE — Telephone Encounter (Signed)
You can send in an rx for humalog. It is equivalent. 25 units with meals.

## 2016-03-31 NOTE — Telephone Encounter (Signed)
Recommendations on the medication change requested.

## 2016-03-31 NOTE — Telephone Encounter (Signed)
Pt had a insurance change, the pharmacist suggested that she switch from Novolog to Humalog, present insurance will no longer cover Novolog.  Pharmacy [facility]  Pt contact  [phone]

## 2016-04-01 ENCOUNTER — Other Ambulatory Visit: Payer: Self-pay | Admitting: Family Medicine

## 2016-04-01 MED ORDER — INSULIN LISPRO 100 UNIT/ML (KWIKPEN): 25.0000 [IU] | PEN_INJECTOR | Freq: Three times a day (TID) | SUBCUTANEOUS | 5 refills | Status: DC

## 2016-04-01 NOTE — Telephone Encounter (Signed)
New medication was sent in

## 2016-04-21 ENCOUNTER — Telehealth: Payer: Self-pay | Admitting: Family Medicine

## 2016-04-21 MED ORDER — FLUTICASONE-SALMETEROL 250-50 MCG/DOSE IN AEPB: 1.0000 | INHALATION_SPRAY | Freq: Two times a day (BID) | RESPIRATORY_TRACT | 1 refills | Status: DC

## 2016-04-21 NOTE — Telephone Encounter (Signed)
Pt needs a refill on ADVAIR DISKUS 250-50 MCG/DOSE [facility] [place]  Fax [phone].Marland Kitchen. Please advise

## 2016-04-21 NOTE — Telephone Encounter (Signed)
faxed to pharmacy that pt requested via fax number.

## 2016-04-27 ENCOUNTER — Encounter: Payer: Self-pay | Admitting: Family Medicine

## 2016-04-27 ENCOUNTER — Ambulatory Visit (INDEPENDENT_AMBULATORY_CARE_PROVIDER_SITE_OTHER): Payer: Medicare Other | Admitting: Family Medicine

## 2016-04-27 VITALS — BP 153/75 | HR 87 | Temp 97.5°F | Resp 14 | Wt 224.4 lb

## 2016-04-27 DIAGNOSIS — Z794 Long term (current) use of insulin: Secondary | ICD-10-CM | POA: Diagnosis not present

## 2016-04-27 DIAGNOSIS — I10 Essential (primary) hypertension: Secondary | ICD-10-CM | POA: Diagnosis not present

## 2016-04-27 DIAGNOSIS — E11319 Type 2 diabetes mellitus with unspecified diabetic retinopathy without macular edema: Secondary | ICD-10-CM | POA: Diagnosis not present

## 2016-04-27 DIAGNOSIS — E1165 Type 2 diabetes mellitus with hyperglycemia: Secondary | ICD-10-CM

## 2016-04-27 DIAGNOSIS — E785 Hyperlipidemia, unspecified: Secondary | ICD-10-CM

## 2016-04-27 LAB — COMPREHENSIVE METABOLIC PANEL
ALBUMIN: 4.4 g/dL (ref 3.5–5.2)
ALK PHOS: 71 U/L (ref 39–117)
ALT: 34 U/L (ref 0–35)
AST: 19 U/L (ref 0–37)
BILIRUBIN TOTAL: 0.3 mg/dL (ref 0.2–1.2)
BUN: 18 mg/dL (ref 6–23)
CALCIUM: 9.8 mg/dL (ref 8.4–10.5)
CO2: 29 mEq/L (ref 19–32)
Chloride: 97 mEq/L (ref 96–112)
Creatinine, Ser: 0.71 mg/dL (ref 0.40–1.20)
GFR: 86.77 mL/min (ref >60.00)
GLUCOSE: 338 mg/dL — AB (ref 70–99)
POTASSIUM: 4.8 meq/L (ref 3.5–5.1)
Sodium: 136 mEq/L (ref 135–145)
TOTAL PROTEIN: 7.2 g/dL (ref 6.0–8.3)

## 2016-04-27 LAB — LIPID PANEL
CHOL/HDL RATIO: 4
CHOLESTEROL: 147 mg/dL (ref 0–200)
HDL: 38.4 mg/dL — ABNORMAL LOW (ref >39.00)
LDL CALC: 72 mg/dL (ref 0–99)
NONHDL: 108.72
Triglycerides: 185 mg/dL — ABNORMAL HIGH (ref 0.0–149.0)
VLDL: 37 mg/dL (ref 0.0–40.0)

## 2016-04-27 LAB — HEMOGLOBIN A1C: HEMOGLOBIN A1C: 10.4 % — AB (ref 4.6–6.5)

## 2016-04-27 MED ORDER — DULAGLUTIDE 0.75 MG/0.5ML ~~LOC~~ SOAJ: SUBCUTANEOUS | 0 refills | Status: DC

## 2016-04-27 NOTE — Assessment & Plan Note (Signed)
Controlled per home readings. Continuing Lisinopril.

## 2016-04-27 NOTE — Patient Instructions (Addendum)
Increase the levemir to 40 units twice daily.  Take the Trulicity once weekly as prescribed.  Follow up in 3 weeks.  Take care  Dr. Adriana Simasook

## 2016-04-27 NOTE — Assessment & Plan Note (Signed)
Uncontrolled, likely worsening. Patient met with pharmacist Bennye Alm. We are increasing her Levemir to 40 units BID. Adding Trulicity. Samples given. Patient is to follow up with PharmD in 3 weeks (per my recommendation).

## 2016-04-27 NOTE — Progress Notes (Signed)
Subjective:  Patient ID: Sherlon Handing, female    DOB: August 24, 1945  Age: 70 y.o. MRN: [id number]  CC: Follow up   HPI:  70 year old female with hypertension, hyperlipidemia, uncontrolled DM 2 with complications presents for follow-up.  DM  Blood sugars readings - Uncontrolled. 335 this am. (majority are >300)  Hypoglycemia - Denies.  Medications - Metformin, Humalog 25 units TID AC, Levemir 35 units BID, Glipizide  Adverse effects - No.  Compliance - Unsure. Patient endorses compliance but does not seem to know her regimen particularly well.  Preventative care  Eye exam - In need of an eye exam.   Foot exam - Up to date.  Last A1C - In need of.  HTN  BP elevated today.  Patient states that her blood pressures at home are well controlled. Typically in the 130s over 23s.  Currently on Lisinopril.  HLD  Has been well controlled on lipitor.  Social Hx   Social History   Social History  . Marital status: Single    Spouse name: N/A  . Number of children: 2  . 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 Smoking status: Former Research scientist (life sciences)  . Smokeless tobacco: Former Systems developer    Quit date: 08/09/2008  . Alcohol use 0.0 oz/week     Comment: Rare  . Drug use: No  . Sexual activity: Not Currently   Other Topics Concern  . None   Social History Narrative   No living will   Son, Nicki Reaper, is health care POA   2 sons- Rodman Key and Nicki Reaper (79 and 59)    4 grandchildren and 6 great-grandchildren   1 dog lives inside   Right handed    Caffeine- 1 soda-diet, 1 cup of coffee   Discussed DNR and she wants this. Done 10/17/12   No tube feeds if cognitively unaware   Review of Systems  Constitutional: Negative.   Gastrointestinal: Positive for diarrhea.   Objective:  BP (!) 153/75 (BP Location: Left Arm, Patient Position: Sitting, Cuff Size: Large)   Pulse 87   Temp 97.5 F (36.4 C) (Oral)   Resp 14    Wt 224 lb 6 oz (101.8 kg)   SpO2 95%   BMI 39.75 kg/m   BP/Weight 04/27/2016 10/25/2015 11/09/6376  Systolic BP 588 502 774  Diastolic BP 75 82 84  Wt. (Lbs) 224.38 224.38 225  BMI 39.75 39.76 39.87   Physical Exam  Constitutional: She is oriented to person, place, and time. She appears well-developed. No distress.  HENT:  Head: Normocephalic and atraumatic.  Eyes: Conjunctivae are normal.  Pulmonary/Chest: Effort normal.  Neurological: She is alert and oriented to person, place, and time.  Skin:  Seborrheic keratoses noted.   Psychiatric: She has a normal mood and affect.  Vitals reviewed.  Lab Results  Component Value Date   WBC 8.2 12/14/2014   HGB 13.0 12/14/2014   HCT 38.7 12/14/2014   PLT 245.0 12/14/2014   GLUCOSE 286 (H) 08/19/2015   CHOL 132 12/14/2014   TRIG 176.0 (H) 12/14/2014   HDL 35.90 (L) 12/14/2014   LDLDIRECT 58.4 10/17/2012   LDLCALC 61 12/14/2014   ALT 39 (H) 08/19/2015   AST 21 08/19/2015   NA 138 08/19/2015   K 4.4 08/19/2015   CL 100 08/19/2015   CREATININE 0.69 08/19/2015   BUN 23 08/19/2015   CO2 29  08/19/2015   TSH 0.83 10/17/2012   HGBA1C 9.8 (H) 08/19/2015   MICROALBUR 1.2 08/19/2015    Assessment & Plan:   Problem List Items Addressed This Visit    Hyperlipemia    Has been well controlled. Lipid panel today. Continue lipitor.      Relevant Orders   Lipid panel   Essential hypertension, benign    Controlled per home readings. Continuing Lisinopril.       DM type 2, uncontrolled, with retinopathy ([place]) - Primary    Uncontrolled, likely worsening. Patient met with pharmacist Bennye Alm. We are increasing her Levemir to 40 units BID. Adding Trulicity. Samples given. Patient is to follow up with PharmD in 3 weeks (per my recommendation).       Relevant Medications   Dulaglutide (TRULICITY) 9.21 BW/3.7NG SOPN   Other Relevant Orders   Comprehensive metabolic panel   Hemoglobin A1c      Meds ordered this encounter    Medications  . Dulaglutide (TRULICITY) 2.37 KC/3.0NP SOPN    Sig: Inject once weekly.    Dispense:  2 pen    Refill:  0   Follow-up: Return in about 3 weeks (around 05/18/2016).  [place]

## 2016-04-27 NOTE — Progress Notes (Signed)
Pre visit review using our clinic review tool, if applicable. No additional management support is needed unless otherwise documented below in the visit note. 

## 2016-04-27 NOTE — Assessment & Plan Note (Signed)
Has been well controlled. Lipid panel today. Continue lipitor. 

## 2016-05-05 ENCOUNTER — Other Ambulatory Visit: Payer: Self-pay | Admitting: Nurse Practitioner

## 2016-05-14 ENCOUNTER — Other Ambulatory Visit: Payer: Self-pay | Admitting: Family Medicine

## 2016-05-14 NOTE — Telephone Encounter (Signed)
Refilled 04/21/16. Pt last seen 04/27/16. Please advise?

## 2016-05-15 ENCOUNTER — Telehealth: Payer: Self-pay | Admitting: Emergency Medicine

## 2016-05-15 ENCOUNTER — Emergency Department: Payer: Medicare Other

## 2016-05-15 ENCOUNTER — Emergency Department
Admission: EM | Admit: 2016-05-15 | Discharge: 2016-05-15 | Disposition: A | Discharge status: Home / Self Care | Payer: Medicare Other | Attending: Emergency Medicine | Admitting: Emergency Medicine

## 2016-05-15 ENCOUNTER — Encounter: Payer: Self-pay | Admitting: Emergency Medicine

## 2016-05-15 DIAGNOSIS — E119 Type 2 diabetes mellitus without complications: Secondary | ICD-10-CM | POA: Insufficient documentation

## 2016-05-15 DIAGNOSIS — Z79899 Other long term (current) drug therapy: Secondary | ICD-10-CM | POA: Diagnosis not present

## 2016-05-15 DIAGNOSIS — I10 Essential (primary) hypertension: Secondary | ICD-10-CM | POA: Insufficient documentation

## 2016-05-15 DIAGNOSIS — Z87891 Personal history of nicotine dependence: Secondary | ICD-10-CM | POA: Insufficient documentation

## 2016-05-15 DIAGNOSIS — Z794 Long term (current) use of insulin: Secondary | ICD-10-CM | POA: Diagnosis not present

## 2016-05-15 DIAGNOSIS — J45901 Unspecified asthma with (acute) exacerbation: Secondary | ICD-10-CM | POA: Diagnosis not present

## 2016-05-15 DIAGNOSIS — R05 Cough: Secondary | ICD-10-CM | POA: Diagnosis not present

## 2016-05-15 MED ORDER — IPRATROPIUM-ALBUTEROL 0.5-2.5 (3) MG/3ML IN SOLN
3.0000 mL | Freq: Once | RESPIRATORY_TRACT | Status: AC
  Administered 2016-05-15: 3 mL via RESPIRATORY_TRACT

## 2016-05-15 MED ORDER — ALBUTEROL SULFATE (5 MG/ML) 0.5% IN NEBU: 2.5000 mg | INHALATION_SOLUTION | Freq: Four times a day (QID) | RESPIRATORY_TRACT | 2 refills | Status: DC | PRN

## 2016-05-15 MED ORDER — PREDNISONE 20 MG PO TABS
ORAL_TABLET | ORAL | Status: AC
  Administered 2016-05-15: 60 mg via ORAL
  Filled 2016-05-15: qty 3

## 2016-05-15 MED ORDER — PREDNISONE 20 MG PO TABS: 60.0000 mg | ORAL_TABLET | Freq: Every day | ORAL | 0 refills | Status: DC

## 2016-05-15 MED ORDER — IPRATROPIUM-ALBUTEROL 0.5-2.5 (3) MG/3ML IN SOLN
RESPIRATORY_TRACT | Status: AC
  Administered 2016-05-15: 3 mL via RESPIRATORY_TRACT
  Filled 2016-05-15: qty 3

## 2016-05-15 MED ORDER — IPRATROPIUM-ALBUTEROL 0.5-2.5 (3) MG/3ML IN SOLN
RESPIRATORY_TRACT | Status: AC
  Filled 2016-05-15: qty 6

## 2016-05-15 MED ORDER — PREDNISONE 20 MG PO TABS
60.0000 mg | ORAL_TABLET | Freq: Once | ORAL | Status: AC
  Administered 2016-05-15: 60 mg via ORAL

## 2016-05-15 NOTE — ED Notes (Addendum)
Pt returns to triage for reassessment; lung sound diminished & requesting breathing tx; also reports has had prednisone tapers in past for her exacerbation; orders entered and initiated; charge nurse notified

## 2016-05-15 NOTE — ED Provider Notes (Signed)
[facility] Emergency Department Provider Note  ____________________________________________   First MD Initiated Contact with Patient 05/15/16 [phone]     (approximate)  I have reviewed the triage vital signs and the nursing notes.   HISTORY  Chief Complaint Asthma    HPI Jamie Strickland is a 71 y.o. female With a history of lifelong asthma and who is a former smoker who presents with gradual onset shortness of breath, cough, and wheezing over the last several days.  She states that her usual Ventolin inhaler is not working and she is out of albuterol liquid for her nebulizer at home.  She has an appointment to see her primary care doctor and 3 days but did not think she should wait that long.  She describes the difficulty breathing as moderate and severe with exertion.  Rest makes it a little bit at her.  She denies chest pain, abdominal pain, nausea, vomiting, fever/chills.  She has had some nasal congestion and runny nose and "stuffiness".   Past Medical History:  Diagnosis Date  . Allergic rhinitis   . Diabetes mellitus type II   . Hx of colonic polyps   . Hyperlipidemia   . Hypertension   . Osteoarthritis     Patient Active Problem List   Diagnosis Date Noted  . Medically noncompliant 02/20/2015  . Mild nonproliferative diabetic retinopathy (HCC) 09/25/2014  . Actinic keratosis 11/13/2013  . Obesity 02/29/2012  . DM type 2, uncontrolled, with retinopathy (HCC) 08/31/2011  . Hyperlipemia 12/28/2007  . Essential hypertension, benign 12/28/2007  . Allergic rhinitis 12/28/2007  . Mild intermittent allergic asthma without complication 12/28/2007  . OSTEOARTHRITIS 12/28/2007  . COLONIC POLYPS, HX OF 12/28/2007    Past Surgical History:  Procedure Laterality Date  . 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 CATARACT EXTRACTION W/ INTRAOCULAR LENS  IMPLANT, BILATERAL Bilateral 2014  . CATARACT  EXTRACTION W/ INTRAOCULAR LENS  IMPLANT, BILATERAL  11/14  . CHOLECYSTECTOMY    . LASIK      Prior to Admission medications   Medication Sig Start Date End Date Taking? Authorizing Provider  ADVAIR DISKUS 250-50 MCG/DOSE AEPB INHALE 1 PUFF BY MOUTH TWICE DAILY 05/14/16   Tommie Sams, DO  albuterol (PROVENTIL) (5 MG/ML) 0.5% nebulizer solution Take 0.5 mLs (2.5 mg total) by nebulization every 6 (six) hours as needed for wheezing or shortness of breath. 05/15/16   Loleta Rose, MD  aspirin EC 81 MG tablet Take 81 mg by mouth daily.    Historical Provider, MD  atorvastatin (LIPITOR) 20 MG tablet TAKE 1 TABLET(20 MG) BY MOUTH DAILY 03/26/16   Tommie Sams, DO  B-D ULTRAFINE III SHORT PEN 31G X 8 MM MISC USE AS DIRECTED 06/11/15   Carollee Leitz, NP  Dulaglutide (TRULICITY) 0.75 MG/0.5ML SOPN Inject once weekly. 04/27/16   Tommie Sams, DO  Fluticasone-Salmeterol (ADVAIR DISKUS) 250-50 MCG/DOSE AEPB Inhale 1 puff into the lungs 2 (two) times daily. 04/21/16   Tommie Sams, DO  glucose blood (ACCU-CHEK SMARTVIEW) test strip TEST BLOOD SUGAR FOUR TIMES DAILY AS NEEDED E11.319 11/11/15   Tommie Sams, DO  HUMALOG KWIKPEN 100 UNIT/ML KiwkPen INJECT 25 UNITS SUBCUTANEOUS THREE TIMES DAILY WITH MEALS 04/01/16   Tommie Sams, DO  insulin detemir (LEVEMIR) 100 UNIT/ML injection Inject 0.35 mLs (35 Units total) into the skin 2 (two) times daily. 11/11/15   Tommie Sams, DO  Insulin Syringe-Needle U-100 (INSULIN  SYRINGE .5CC/30GX5/16") 30G X 5/16" 0.5 ML MISC USE TO INJECT 3 TO 4 TIMES DAILY 08/02/15   Carollee Leitzarrie M Doss, NP  lisinopril (PRINIVIL,ZESTRIL) 20 MG tablet TAKE 1 TABLET(20 MG) BY MOUTH DAILY 05/05/16   Tommie SamsJayce G Cook, DO  metFORMIN (GLUCOPHAGE) 1000 MG tablet TAKE 1 TABLET BY MOUTH TWICE DAILY WITH A MEAL 03/14/15   Carollee Leitzarrie M Doss, NP  metFORMIN (GLUCOPHAGE) 1000 MG tablet TAKE 1 TABLET BY MOUTH TWICE DAILY WITH A MEAL 05/05/16   Tommie SamsJayce G Cook, DO  predniSONE (DELTASONE) 20 MG tablet Take 3 tablets (60 mg total) by  mouth daily. 05/15/16   Loleta Roseory Keshana Klemz, MD  VENTOLIN HFA 108 (90 Base) MCG/ACT inhaler INHALE 2 PUFFS INTO THE LUNGS FOUR TIMES DAILY AS NEEDED FOR WHEEZING OR SHORTNESS OF BREATH OR ASTHMA 12/10/15   Tommie SamsJayce G Cook, DO    Allergies Codeine and Invokana [canagliflozin]  Family History  Problem Relation Age of Onset  . Diabetes Mother   . Heart disease Mother   . Diabetes Father     brittle diabetes  . Asthma Father 7178  . Liver disease Father     liver problems  . Heart disease Father   . Heart disease Brother     CAD  . Cancer Maternal Aunt     Breast  . Heart disease Maternal Grandfather     CAD    Social History Social History  Substance Use Topics  . Smoking status: Former Games developermoker  . Smokeless tobacco: Former NeurosurgeonUser    Quit date: 08/09/2008  . Alcohol use 0.0 oz/week     Comment: Rare    Review of Systems Constitutional: No fever/chills Eyes: No visual changes. ENT: No sore throat. Cardiovascular: Denies chest pain. Respiratory: +shortness of breath, wheezing, productive cough Gastrointestinal: No abdominal pain.  No nausea, no vomiting.  No diarrhea.  No constipation. Genitourinary: Negative for dysuria. Musculoskeletal: Negative for back pain. Skin: Negative for rash. Neurological: Negative for headaches, focal weakness or numbness.  10-point ROS otherwise negative.  ____________________________________________   PHYSICAL EXAM:  VITAL SIGNS: ED Triage Vitals  Enc Vitals Group     BP 05/15/16 0132 (!) 200/74     Pulse Rate 05/15/16 0132 91     Resp 05/15/16 0132 18     Temp 05/15/16 0132 97.6 F (36.4 C)     Temp Source 05/15/16 0132 Oral     SpO2 05/15/16 0132 95 %     Weight 05/15/16 0130 220 lb (99.8 kg)     Height 05/15/16 0130 5\' 3"  (1.6 m)     Head Circumference --      Peak Flow --      Pain Score --      Pain Loc --      Pain Edu? --      Excl. in GC? --     Constitutional: Alert and oriented. Well appearing and in no acute distress. Eyes:  Conjunctivae are normal. PERRL. EOMI. Head: Atraumatic. Nose: No congestion/rhinnorhea. Mouth/Throat: Mucous membranes are moist.  Oropharynx non-erythematous. Neck: No stridor.  No meningeal signs.   Cardiovascular: Normal rate, regular rhythm. Good peripheral circulation. Grossly normal heart sounds. Respiratory: Normal respiratory effort.  No retractions. Lungs CTAB. Occasionally purses her lips to breathe. Gastrointestinal: Soft and nontender. No distention.  Musculoskeletal: No lower extremity tenderness nor edema. No gross deformities of extremities. Neurologic:  Normal speech and language. No gross focal neurologic deficits are appreciated.  Skin:  Skin is warm, dry and intact. No  rash noted. Psychiatric: Mood and affect are normal. Speech and behavior are normal.  ____________________________________________   LABS (all labs ordered are listed, but only abnormal results are displayed)  Labs Reviewed - No data to display ____________________________________________  EKG  None - EKG not ordered by ED physician ____________________________________________  RADIOLOGY   Dg Chest 2 View  Result Date: 05/15/2016 CLINICAL DATA:  Cough, congestion, wheezing. History of hypertension, hyperlipidemia and diabetes. EXAM: CHEST  2 VIEW COMPARISON:  Chest radiograph June 29, 2005 FINDINGS: Cardiomediastinal silhouette is normal. Mild bronchitic changes without pleural effusion or focal consolidation. Bibasilar strandy densities. No pneumothorax. Soft tissue planes and included osseous structures are nonsuspicious. Trace calcification in RIGHT neck are likely vascular. IMPRESSION: Mild bronchitic changes without focal consolidation. Bibasilar atelectasis. Electronically Signed   By: Awilda Metro M.D.   On: 05/15/2016 01:47    ____________________________________________   PROCEDURES  Procedure(s) performed:   Procedures   Critical Care performed:  No ____________________________________________   INITIAL IMPRESSION / ASSESSMENT AND PLAN / ED COURSE  Pertinent labs & imaging results that were available during my care of the patient were reviewed by me and considered in my medical decision making (see chart for details).  patient is well-appearing and feels much better after 2 DuoNeb's and prednisone that she received prior to my evaluation.  She states that she is use to asthma exacerbation that is what this feels like.  She has no evidence of acute or emergent medical condition other than the asthma exacerbation and she is comfortable with plan to follow up with her PCP.  She has inhalers at home and I provided a prescription for both a burst course of prednisone and some albuterol nebulizer liquid.  I gave my usual and customary return precautions.       ____________________________________________  FINAL CLINICAL IMPRESSION(S) / ED DIAGNOSES  Final diagnoses:  Exacerbation of asthma, unspecified asthma severity, unspecified whether persistent     MEDICATIONS GIVEN DURING THIS VISIT:  Medications  predniSONE (DELTASONE) tablet 60 mg (60 mg Oral Given 05/15/16 0318)  ipratropium-albuterol (DUONEB) 0.5-2.5 (3) MG/3ML nebulizer solution 3 mL ( Nebulization Not Given 05/15/16 0319)  ipratropium-albuterol (DUONEB) 0.5-2.5 (3) MG/3ML nebulizer solution 3 mL (3 mLs Nebulization Given 05/15/16 0319)  ipratropium-albuterol (DUONEB) 0.5-2.5 (3) MG/3ML nebulizer solution 3 mL (3 mLs Nebulization Given 05/15/16 0318)     NEW OUTPATIENT MEDICATIONS STARTED DURING THIS VISIT:  Discharge Medication List as of 05/15/2016  6:45 AM    START taking these medications   Details  albuterol (PROVENTIL) (5 MG/ML) 0.5% nebulizer solution Take 0.5 mLs (2.5 mg total) by nebulization every 6 (six) hours as needed for wheezing or shortness of breath., Starting Fri 05/15/2016, Print    predniSONE (DELTASONE) 20 MG tablet Take 3 tablets (60 mg total) by mouth  daily., Starting Fri 05/15/2016, Print        Discharge Medication List as of 05/15/2016  6:45 AM      Discharge Medication List as of 05/15/2016  6:45 AM       Note:  This document was prepared using Dragon voice recognition software and may include unintentional dictation errors.    Loleta Rose, MD 05/15/16 [phone]

## 2016-05-15 NOTE — ED Notes (Signed)

## 2016-05-15 NOTE — ED Notes (Signed)
Neb complete; pt reports feeling some better now; resp even/unlab with some upper airway wheezing now noted

## 2016-05-15 NOTE — ED Triage Notes (Addendum)
Patient ambulatory to triage with steady gait, without difficulty or distress noted, congested cough noted; pt reports wheezing and prod cough green sputum, sinus congestion last few days; using ventolin without relief

## 2016-05-15 NOTE — Telephone Encounter (Signed)
[facility] pharmacy called questioning the concentration and type of albuterol on rx.  Can change to 1 box of 25 of the 2.5 mg bullets per dr Mayford Knifewilliams.

## 2016-05-17 DIAGNOSIS — R05 Cough: Secondary | ICD-10-CM | POA: Diagnosis not present

## 2016-05-17 DIAGNOSIS — J40 Bronchitis, not specified as acute or chronic: Secondary | ICD-10-CM | POA: Diagnosis not present

## 2016-05-17 DIAGNOSIS — J45909 Unspecified asthma, uncomplicated: Secondary | ICD-10-CM | POA: Diagnosis not present

## 2016-05-17 DIAGNOSIS — Z79899 Other long term (current) drug therapy: Secondary | ICD-10-CM | POA: Diagnosis not present

## 2016-05-18 ENCOUNTER — Other Ambulatory Visit: Payer: Self-pay | Admitting: Family Medicine

## 2016-05-18 ENCOUNTER — Ambulatory Visit: Payer: Medicare Other | Admitting: Pharmacist

## 2016-05-18 ENCOUNTER — Encounter: Payer: Self-pay | Admitting: Family Medicine

## 2016-05-18 MED ORDER — DOXYCYCLINE HYCLATE 100 MG PO TABS: 100.0000 mg | ORAL_TABLET | Freq: Two times a day (BID) | ORAL | 0 refills | Status: DC

## 2016-05-18 MED ORDER — ALBUTEROL SULFATE HFA 108 (90 BASE) MCG/ACT IN AERS: INHALATION_SPRAY | RESPIRATORY_TRACT | 0 refills | Status: DC

## 2016-05-18 MED ORDER — ALBUTEROL SULFATE (5 MG/ML) 0.5% IN NEBU: 2.5000 mg | INHALATION_SOLUTION | Freq: Four times a day (QID) | RESPIRATORY_TRACT | 2 refills | Status: DC | PRN

## 2016-05-18 NOTE — Progress Notes (Unsigned)
Patient having wheezing to right middle lobe, with SOB was sen in ED on Friday 05/15/16 and advised she had acute bronchitis, put on prednisone 60 mg daily for 5 days.  Patient presented with SOB, wheezing pulse 114 with 02 @ 92% with talking, and walking rose to 94% on room air.air. BP 180/70 patient refused ED talked with PCP . PCP called in Doxycycline  100 mg BID, advised patient that if symptoms do not improve are they worsen to go to ED.

## 2016-05-25 ENCOUNTER — Encounter: Payer: Self-pay | Admitting: Pharmacist

## 2016-05-25 ENCOUNTER — Ambulatory Visit (INDEPENDENT_AMBULATORY_CARE_PROVIDER_SITE_OTHER): Payer: Medicare Other | Admitting: Pharmacist

## 2016-05-25 DIAGNOSIS — E1165 Type 2 diabetes mellitus with hyperglycemia: Secondary | ICD-10-CM

## 2016-05-25 DIAGNOSIS — E11319 Type 2 diabetes mellitus with unspecified diabetic retinopathy without macular edema: Secondary | ICD-10-CM | POA: Diagnosis not present

## 2016-05-25 DIAGNOSIS — J4520 Mild intermittent asthma, uncomplicated: Secondary | ICD-10-CM

## 2016-05-25 DIAGNOSIS — Z794 Long term (current) use of insulin: Secondary | ICD-10-CM | POA: Diagnosis not present

## 2016-05-25 MED ORDER — DULAGLUTIDE 0.75 MG/0.5ML ~~LOC~~ SOAJ: SUBCUTANEOUS | 0 refills | Status: DC

## 2016-05-25 MED ORDER — INSULIN DETEMIR 100 UNIT/ML ~~LOC~~ SOLN: 44.0000 [IU] | Freq: Two times a day (BID) | SUBCUTANEOUS | 3 refills | Status: DC

## 2016-05-25 MED ORDER — ALBUTEROL SULFATE (5 MG/ML) 0.5% IN NEBU: 2.5000 mg | INHALATION_SOLUTION | Freq: Four times a day (QID) | RESPIRATORY_TRACT | 2 refills | Status: DC | PRN

## 2016-05-25 MED ORDER — DULAGLUTIDE 1.5 MG/0.5ML ~~LOC~~ SOAJ: 1.5000 mg | SUBCUTANEOUS | 0 refills | Status: DC

## 2016-05-25 NOTE — Progress Notes (Signed)
Care was provided under my supervision. I agree with the management as indicated in the note.  Brittiany Wiehe DO  

## 2016-05-25 NOTE — Progress Notes (Signed)
    S:    Patient arrives in good spirits stating she is recovering from what she believes is bronchitis.  Presents for diabetes evaluation, education, and management at the request of Dr Adriana Simasook. Patient was referred on 04/27/2016.  Patient was last seen by Primary Care Provider on 04/27/16.   [facility] ED with asthma exacerbation and was given Doyxycline by Dr Adriana Simasook for acute bronchitis.  Today she states she has finished 5 day prednisone las week and completed Doxy yesterday. She reports her mucus is more clear, shortness of breath is improving, and she does still have some sore throat and cough.  She denies fevers.    Reports having to use albuterol nebulizer every 2-4 hours but now down to twice daily   Patient reports adherence with medications. She reports utilizing a pill box and denies missing doses of her insulin.  Current diabetes medications include: metformin 1000 mg BID, Levemir 40 units BID, Trulicity 0.75 mg weekly Current hypertension medications include:   Patient reports hypoglycemic events once time (43 mg/dL) in which she states she may not have eaten the day before.  She does not remember exactly when this happened and the episode was not in her CBG log or on her meter.     Patient reported dietary habits: Eats 2-3 meals/day Breakfast: bacon, sausage, eggs, grits, toast (9 grain) Lunch:salad or sandwich Dinner:meat with vegetables or potato/rice Snacks:rarely Drinks: coffee, water, diet pepsi    Patient reports nocturia 1-2x/night.  Patient denies neuropathy. Patient denies visual changes. Patient denies self foot exams.     O:  Lab Results  Component Value Date   HGBA1C 10.4 (H) 04/27/2016   Home CBG 7 day avg (n=12) 251 mg/dL  14 day avg (Q=03n=27) 474306 mg/dL  30 day avg (Q=59n=58) 563287 mg/dL  Fasting CBGs (8/7-5/641/4-1/15): 213, 285, 418, 369, 203, 187, 248, 214, 237, 206 mg/dL CBGs (1-2 hour post dinner): 363, 399, 569, 439, 427, 180, 313, 425, 228, 298,  226 mg/dL   A/P: Diabetes longstanding diagnosed currently uncontrolled with patient recently having prednisone x 5 days last week and respiratory infection. Patient reports hypoglycemic event on 1 occasion but this was >3 weeks ago and is able to verbalize appropriate hypoglycemia management plan. Patient reports adherence with medication. Control is suboptimal due to diet, sedentary lifestyle, and adherence. Discussed importance of foot and eye exams and instructed patient to check her feet daily.   Following discussion and approval by Dr Adriana Simasook, the following medication changes were made:  Increased dose of basal insulin Lantus (insulin glargine) to 44 units twice daily Continued Trulicity (dulaglutide) 0.75 mg weekly x 2 weeks (patient hesitant to increase due to respiratory infection).  Then will increase Trulicity to 1.5 mg weekly. Samples were provided.  Instructed patient to call clinic if she has increased hypoglycemia.  Instructed her to continue checking blood glucose twice daily.   Next A1C anticipated in 2 months.    ASCVD risk greater than 7.5%. Continued Aspirin 81 mg and Continued atorvastatin 20 mg daily.    Written patient instructions provided.  Total time in face to face counseling 35 minutes.   Follow up in Pharmacist Clinic Visit in 4 weeks.     Patient was seen with Dr Adriana Simasook today in clinic and medication changes were discussed and approved prior to initiation.

## 2016-05-25 NOTE — Patient Instructions (Addendum)
Continue Trulicity 0.75 mg once a week for 2 weeks then increase to Trulicity 1.5 mg once weekly.   Increase Levemir to 44 units twice daily   Continue checking blood glucose twice daily.  Make sure night reading is 2 hours after food.  Call clinic if you start having more episodes of low blood sugars <70 mg/dL.    Followup with Hazle NordmannKelsy Combs, PharmD in 4 weeks.

## 2016-06-03 ENCOUNTER — Telehealth: Payer: Self-pay | Admitting: Family Medicine

## 2016-06-03 DIAGNOSIS — J4520 Mild intermittent asthma, uncomplicated: Secondary | ICD-10-CM

## 2016-06-03 MED ORDER — ALBUTEROL SULFATE (5 MG/ML) 0.5% IN NEBU: 2.5000 mg | INHALATION_SOLUTION | Freq: Four times a day (QID) | RESPIRATORY_TRACT | 2 refills | Status: DC | PRN

## 2016-06-03 NOTE — Telephone Encounter (Signed)
RX corrected. 

## 2016-06-03 NOTE — Telephone Encounter (Signed)
Pt called and stated that she has called the pharmacy and they do not have her albuterol (PROVENTIL) (5 MG/ML) 0.5% nebulizer solution. Please advise, thank you!  [facility] - [facility] [address] - [place], [place] - [id number]  Call pt @ [phone]

## 2016-06-04 ENCOUNTER — Telehealth: Payer: Self-pay | Admitting: *Deleted

## 2016-06-04 NOTE — Telephone Encounter (Signed)
[facility] for Advair Diskus, [facility] do not carry this and has requested it be changed to albuterol sulfate 0.083  Pharmacy [facility] on [address]

## 2016-06-04 NOTE — Telephone Encounter (Signed)
Order albuterol Needs to be on Advair as well.

## 2016-06-04 NOTE — Telephone Encounter (Signed)
Order of original medication was sent in for something the pharmacy does not carry. Pharmacy called and it was corrected. Pt aware of corrected rx at pharmacy.

## 2016-06-04 NOTE — Telephone Encounter (Signed)
Pt was called and is okay with switch to albuterol. Please place order and sent to [facility] [address]

## 2016-06-18 ENCOUNTER — Other Ambulatory Visit: Payer: Self-pay | Admitting: Family Medicine

## 2016-06-18 DIAGNOSIS — E11319 Type 2 diabetes mellitus with unspecified diabetic retinopathy without macular edema: Secondary | ICD-10-CM

## 2016-06-18 DIAGNOSIS — Z794 Long term (current) use of insulin: Principal | ICD-10-CM

## 2016-06-18 DIAGNOSIS — E1165 Type 2 diabetes mellitus with hyperglycemia: Principal | ICD-10-CM

## 2016-06-18 MED ORDER — INSULIN DETEMIR 100 UNIT/ML ~~LOC~~ SOLN: 44.0000 [IU] | Freq: Two times a day (BID) | SUBCUTANEOUS | 3 refills | Status: DC

## 2016-06-18 NOTE — Telephone Encounter (Signed)
Pt called about needing a Rx sent to pharmacy for insulin detemir (LEVEMIR) 100 UNIT/ML injection. Pt states it was 35 and was told to increase to 44 twice a day and it's not enough. Please advise?  [phone] - [place], [place] - [address]  Call pt @ [phone]. Thank you!

## 2016-06-18 NOTE — Telephone Encounter (Signed)
Stating that with increase the insulin is not lasting very long. Please advise RX.

## 2016-06-29 ENCOUNTER — Institutional Professional Consult (permissible substitution): Payer: Medicare Other | Admitting: Pharmacist

## 2016-07-27 ENCOUNTER — Other Ambulatory Visit: Payer: Self-pay | Admitting: Family Medicine

## 2016-07-27 ENCOUNTER — Other Ambulatory Visit: Payer: Self-pay | Admitting: Nurse Practitioner

## 2016-07-28 NOTE — Telephone Encounter (Signed)
Please advise, thanks.

## 2016-08-17 ENCOUNTER — Ambulatory Visit (INDEPENDENT_AMBULATORY_CARE_PROVIDER_SITE_OTHER): Payer: Medicare Other

## 2016-08-17 VITALS — BP 150/80 | HR 69 | Temp 97.9°F | Resp 14 | Ht 62.5 in | Wt 215.4 lb

## 2016-08-17 DIAGNOSIS — Z0000 Encounter for general adult medical examination without abnormal findings: Secondary | ICD-10-CM

## 2016-08-17 NOTE — Patient Instructions (Addendum)
  Ms. HoffmaBernsteinThank you for taking time to come for your Medicare Wellness Visit. I appreciate your ongoing commitment to your health goals. Please review the following plan we discussed and let me know if I can assist you in the future.   Follow up with Dr. Adriana Simas as needed.    Bring a copy of your Health Care Power of Attorney and/or Living Will to be scanned into chart.  Have a great day!  These are the goals we discussed: Goals    . Healthy Lifestyle           Stay active Stay hydrated Low carb food       This is a list of the screening recommended for you and due dates:  Health Maintenance  Topic Date Due  .  Hepatitis C: One time screening is recommended by Center for Disease Control  (CDC) for  adults born from [birth year] through 1963.   [birth year]  . Eye exam for diabetics  09/11/2015  . Mammogram  09/07/2017*  . DEXA scan (bone density measurement)  09/07/2017*  . Complete foot exam   08/18/2016  . Hemoglobin A1C  10/26/2016  . Flu Shot  12/09/2016  . Tetanus Vaccine  12/27/2017  . Colon Cancer Screening  09/02/2024  . Pneumonia vaccines  Completed  *Topic was postponed. The date shown is not the original due date.

## 2016-08-17 NOTE — Progress Notes (Signed)
Subjective:   Jamie Strickland is a 71 y.o. female who presents for an Initial Medicare Annual Wellness Visit.  Review of Systems    No ROS.  Medicare Wellness Visit.  Cardiac Risk Factors include: advanced age (>53men, >13 women);diabetes mellitus;hypertension;obesity (BMI >30kg/m2)     Objective:    Today's Vitals   08/17/16 1644  BP: (!) 150/80  Pulse: 69  Resp: 14  Temp: 97.9 F (36.6 C)  TempSrc: Oral  SpO2: 95%  Weight: 215 lb 6.4 oz (97.7 kg)  Height: 5' 2.5" (1.588 m)   Body mass index is 38.77 kg/m.   Current Medications (verified) Outpatient Encounter Prescriptions as of 08/17/2016  Medication Sig  . ADVAIR DISKUS 250-50 MCG/DOSE AEPB INHALE 1 PUFF BY MOUTH TWICE DAILY  . albuterol (PROVENTIL) (5 MG/ML) 0.5% nebulizer solution Take 0.5 mLs (2.5 mg total) by nebulization every 6 (six) hours as needed for wheezing or shortness of breath.  Marland Kitchen albuterol (VENTOLIN HFA) 108 (90 Base) MCG/ACT inhaler INHALE 2 PUFFS INTO THE LUNGS FOUR TIMES DAILY AS NEEDED FOR WHEEZING OR SHORTNESS OF BREATH OR ASTHMA  . aspirin EC 81 MG tablet Take 81 mg by mouth daily.  Marland Kitchen atorvastatin (LIPITOR) 20 MG tablet TAKE 1 TABLET(20 MG) BY MOUTH DAILY  . B-D ULTRAFINE III SHORT PEN 31G X 8 MM MISC USE AS DIRECTED  . Dulaglutide (TRULICITY) 0.75 MG/0.5ML SOPN Inject once weekly.  . Dulaglutide (TRULICITY) 1.5 MG/0.5ML SOPN Inject 1.5 mg into the skin once a week. Use after you complete the 0.75 mg weekly samples  . glucose blood (ACCU-CHEK SMARTVIEW) test strip TEST BLOOD SUGAR FOUR TIMES DAILY AS NEEDED E11.319  . insulin detemir (LEVEMIR) 100 UNIT/ML injection Inject 0.44 mLs (44 Units total) into the skin 2 (two) times daily.  . Insulin Syringe-Needle U-100 (INSULIN SYRINGE .5CC/30GX5/16") 30G X 5/16" 0.5 ML MISC USE TO INJECT 3 TO 4 TIMES DAILY  . lisinopril (PRINIVIL,ZESTRIL) 20 MG tablet TAKE 1 TABLET(20 MG) BY MOUTH DAILY  . metFORMIN (GLUCOPHAGE) 1000 MG tablet TAKE 1 TABLET BY MOUTH  TWICE DAILY WITH A MEAL   No facility-administered encounter medications on file as of 08/17/2016.     Allergies (verified) Invokana [canagliflozin] and Codeine   History: Past Medical History:  Diagnosis Date  . Allergic rhinitis   . Diabetes mellitus type II   . Hx of colonic polyps   . Hyperlipidemia   . Hypertension   . Osteoarthritis    Past Surgical History:  Procedure Laterality Date  . 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 CATARACT EXTRACTION W/ INTRAOCULAR LENS  IMPLANT, BILATERAL Bilateral 2014  . CATARACT EXTRACTION W/ INTRAOCULAR LENS  IMPLANT, BILATERAL  11/14  . CHOLECYSTECTOMY    . LASIK     Family History  Problem Relation Age of Onset  . Diabetes Mother   . Heart disease Mother   . Diabetes Father     brittle diabetes  . Asthma Father 39  . Liver disease Father     liver problems  . Heart disease Father   . Heart disease Brother     CAD  . Cancer Maternal Aunt     Breast  . Heart disease Maternal Grandfather     CAD  . Cancer Brother     lung   Social History   Occupational History  . LPN     on nights at [employer]   Social History Main Topics  .  Smoking status: Former Games developer  . Smokeless tobacco: Former Neurosurgeon    Quit date: 08/09/2008  . Alcohol use 0.0 oz/week     Comment: Rare  . Drug use: No  . Sexual activity: Not Currently    Tobacco Counseling Counseling given: Not Answered   Activities of Daily Living In your present state of health, do you have any difficulty performing the following activities: 08/17/2016  Hearing? N  Vision? N  Difficulty concentrating or making decisions? N  Walking or climbing stairs? Y  Dressing or bathing? N  Doing errands, shopping? N  Preparing Food and eating ? N  Using the Toilet? N  In the past six months, have you accidently leaked urine? N  Do you have problems with loss of bowel control? N  Managing your Medications? N  Managing your Finances?  N  Housekeeping or managing your Housekeeping? N  Some recent data might be hidden    Immunizations and Health Maintenance Immunization History  Administered Date(s) Administered  . Influenza Split 02/19/2012, 03/11/2014  . Influenza Whole 03/04/2009, 03/03/2010  . Influenza-Unspecified 01/17/2016  . Pneumococcal Conjugate-13 11/16/2014  . Pneumococcal Polysaccharide-23 05/11/2004, 02/08/2013  . Td 12/28/2007  . Zoster 08/31/2011   Health Maintenance Due  Topic Date Due  . Hepatitis C Screening  06/13/1945  . OPHTHALMOLOGY EXAM  09/11/2015    Patient Care Team: Tommie Sams, DO as PCP - General (Family Medicine)  Indicate any recent Medical Services you may have received from other than [facility] providers in the past year (date may be approximate).     Assessment:   This is a routine wellness examination for Jamie Strickland. The goal of the wellness visit is to assist the patient how to close the gaps in care and create a preventative care plan for the patient.   Taking calcium VIT D as appropriate/Osteoporosis risk reviewed.  Medications reviewed; taking without issues or barriers.  Safety issues reviewed; smoke detectors in the home. No firearms in the home. Wears seatbelts when driving or riding with others. Patient does wear sunscreen or protective clothing when in direct sunlight. No violence in the home.  No new identified risk were noted.  No failures at ADL's or IADL's.   BMI- discussed the importance of a healthy diet, water intake and exercise. Educational material provided.   Daily fluid intake: 2 cups of caffeine, 2 cup of water.  HTN- followed by PCP.  Dental- every six months.  Dr. Vonna Kotyk.  Sleep patterns- Sleeps 5 hours at night.  Wakes feeling rested.  Hepatitis C Screening discussed, educational material provided.  Patient Concerns: None at this time. Follow up with PCP as needed.  Hearing/Vision screen Hearing Screening Comments: Patient is able to  hear conversational tones without difficulty.  No issues reported.   Vision Screening Comments: Followed by [facility] (Dr. Vonna Kotyk) Wears corrective lenses for reading only Last OV 09/2015 Cataract extraction, bilateral Visual acuity not assessed per patient preference since they have regular follow up with the ophthalmologist  Dietary issues and exercise activities discussed: Current Exercise Habits: Home exercise routine, Type of exercise: walking, Time (Minutes): 55, Frequency (Times/Week): 4, Weekly Exercise (Minutes/Week): 220, Intensity: Moderate  Goals    . Healthy Lifestyle           Stay active Stay hydrated Low carb food      Depression Screen PHQ 2/9 Scores 08/17/2016 08/19/2015  PHQ - 2 Score 0 0    Fall Risk Fall Risk  08/17/2016 08/19/2015  Falls in the past year? No No    Cognitive Function: MMSE - Mini Mental State Exam 08/17/2016  Not completed: Refused        Screening Tests Health Maintenance  Topic Date Due  . Hepatitis C Screening  [birth year]  . OPHTHALMOLOGY EXAM  09/11/2015  . MAMMOGRAM  09/07/2017 (Originally 07/05/2016)  . DEXA SCAN  09/07/2017 (Originally 05/30/2012)  . FOOT EXAM  08/18/2016  . HEMOGLOBIN A1C  10/26/2016  . INFLUENZA VACCINE  12/09/2016  . TETANUS/TDAP  12/27/2017  . COLONOSCOPY  09/02/2024  . PNA vac Low Risk Adult  Completed      Plan:    End of life planning; Advance aging; Advanced directives discussed. Copy of current HCPOA/Living Will requested.    Medicare Attestation I have personally reviewed: The patient's medical and social history Their use of alcohol, tobacco or illicit drugs Their current medications and supplements The patient's functional ability including ADLs,fall risks, home safety risks, cognitive, and hearing and visual impairment Diet and physical activities Evidence for depression   The patient's weight, height, BMI, and visual acuity have been recorded in the chart.  I have made  referrals and provided education to the patient based on review of the above and I have provided the patient with a written personalized care plan for preventive services.    During the course of the visit, Cheryln was educated and counseled about the following appropriate screening and preventive services:   Vaccines to include Pneumoccal, Influenza, Hepatitis B, Td, Zostavax, HCV  Colorectal cancer screening-UTD  Bone density screening-declined referral/order  Diabetes-followed by PCP  Glaucoma screening-annual eye exam  Mammography-declined referral/order  Nutrition counseling  Patient Instructions (the written plan) were given to the patient.    Ashok Pall, LPN   05/16/1094

## 2016-08-17 NOTE — Progress Notes (Signed)
Care was provided under my supervision. I agree with the management as indicated in the note.  Gyan Cambre DO  

## 2016-10-04 ENCOUNTER — Encounter (HOSPITAL_BASED_OUTPATIENT_CLINIC_OR_DEPARTMENT_OTHER): Payer: Self-pay | Admitting: Emergency Medicine

## 2016-10-04 ENCOUNTER — Observation Stay (HOSPITAL_COMMUNITY): Payer: Medicare Other

## 2016-10-04 ENCOUNTER — Emergency Department (HOSPITAL_BASED_OUTPATIENT_CLINIC_OR_DEPARTMENT_OTHER): Payer: Medicare Other

## 2016-10-04 ENCOUNTER — Observation Stay (HOSPITAL_BASED_OUTPATIENT_CLINIC_OR_DEPARTMENT_OTHER)
Admission: EM | Admit: 2016-10-04 | Discharge: 2016-10-04 | Discharge status: Against Medical Advice | Payer: Medicare Other | Attending: Family Medicine | Admitting: Family Medicine

## 2016-10-04 DIAGNOSIS — R42 Dizziness and giddiness: Secondary | ICD-10-CM | POA: Insufficient documentation

## 2016-10-04 DIAGNOSIS — R51 Headache: Secondary | ICD-10-CM | POA: Insufficient documentation

## 2016-10-04 DIAGNOSIS — Z6839 Body mass index (BMI) 39.0-39.9, adult: Secondary | ICD-10-CM | POA: Diagnosis not present

## 2016-10-04 DIAGNOSIS — E113299 Type 2 diabetes mellitus with mild nonproliferative diabetic retinopathy without macular edema, unspecified eye: Secondary | ICD-10-CM | POA: Insufficient documentation

## 2016-10-04 DIAGNOSIS — R200 Anesthesia of skin: Secondary | ICD-10-CM | POA: Diagnosis not present

## 2016-10-04 DIAGNOSIS — I10 Essential (primary) hypertension: Secondary | ICD-10-CM | POA: Insufficient documentation

## 2016-10-04 DIAGNOSIS — Z7982 Long term (current) use of aspirin: Secondary | ICD-10-CM | POA: Insufficient documentation

## 2016-10-04 DIAGNOSIS — Z79899 Other long term (current) drug therapy: Secondary | ICD-10-CM | POA: Diagnosis not present

## 2016-10-04 DIAGNOSIS — R2990 Unspecified symptoms and signs involving the nervous system: Secondary | ICD-10-CM

## 2016-10-04 DIAGNOSIS — J45909 Unspecified asthma, uncomplicated: Secondary | ICD-10-CM | POA: Insufficient documentation

## 2016-10-04 DIAGNOSIS — E785 Hyperlipidemia, unspecified: Secondary | ICD-10-CM | POA: Diagnosis not present

## 2016-10-04 DIAGNOSIS — Z87891 Personal history of nicotine dependence: Secondary | ICD-10-CM | POA: Insufficient documentation

## 2016-10-04 DIAGNOSIS — E11319 Type 2 diabetes mellitus with unspecified diabetic retinopathy without macular edema: Secondary | ICD-10-CM | POA: Diagnosis present

## 2016-10-04 DIAGNOSIS — H539 Unspecified visual disturbance: Secondary | ICD-10-CM | POA: Insufficient documentation

## 2016-10-04 DIAGNOSIS — Z794 Long term (current) use of insulin: Secondary | ICD-10-CM | POA: Diagnosis not present

## 2016-10-04 DIAGNOSIS — IMO0002 Reserved for concepts with insufficient information to code with codable children: Secondary | ICD-10-CM | POA: Diagnosis present

## 2016-10-04 DIAGNOSIS — E669 Obesity, unspecified: Secondary | ICD-10-CM | POA: Insufficient documentation

## 2016-10-04 DIAGNOSIS — E1165 Type 2 diabetes mellitus with hyperglycemia: Secondary | ICD-10-CM | POA: Diagnosis present

## 2016-10-04 LAB — CBC
HCT: 40.8 % (ref 36.0–46.0)
HEMATOCRIT: 41.3 % (ref 36.0–46.0)
HEMOGLOBIN: 13.6 g/dL (ref 12.0–15.0)
Hemoglobin: 13.7 g/dL (ref 12.0–15.0)
MCH: 30.4 pg (ref 26.0–34.0)
MCH: 29.6 pg (ref 26.0–34.0)
MCHC: 33.6 g/dL (ref 30.0–36.0)
MCHC: 32.9 g/dL (ref 30.0–36.0)
MCV: 90.5 fL (ref 78.0–100.0)
MCV: 89.8 fL (ref 78.0–100.0)
PLATELETS: 218 10*3/uL (ref 150–400)
Platelets: 226 10*3/uL (ref 150–400)
RBC: 4.51 MIL/uL (ref 3.87–5.11)
RBC: 4.6 MIL/uL (ref 3.87–5.11)
RDW: 12.9 % (ref 11.5–15.5)
RDW: 13 % (ref 11.5–15.5)
WBC: 7.1 10*3/uL (ref 4.0–10.5)
WBC: 8.7 10*3/uL (ref 4.0–10.5)

## 2016-10-04 LAB — URINALYSIS, ROUTINE W REFLEX MICROSCOPIC
Bilirubin Urine: NEGATIVE
HGB URINE DIPSTICK: NEGATIVE
KETONES UR: NEGATIVE mg/dL
Leukocytes, UA: NEGATIVE
Nitrite: NEGATIVE
PROTEIN: NEGATIVE mg/dL
Specific Gravity, Urine: 1.027 (ref 1.005–1.030)
pH: 5.5 (ref 5.0–8.0)

## 2016-10-04 LAB — GLUCOSE, CAPILLARY
GLUCOSE-CAPILLARY: 257 mg/dL — AB (ref 65–99)
Glucose-Capillary: 359 mg/dL — ABNORMAL HIGH (ref 65–99)

## 2016-10-04 LAB — CREATININE, SERUM
Creatinine, Ser: 0.53 mg/dL (ref 0.44–1.00)
GFR calc non Af Amer: >60 mL/min (ref >60)

## 2016-10-04 LAB — BASIC METABOLIC PANEL
Anion gap: 9 (ref 5–15)
BUN: 17 mg/dL (ref 6–20)
CALCIUM: 9.2 mg/dL (ref 8.9–10.3)
CO2: 23 mmol/L (ref 22–32)
CREATININE: 0.55 mg/dL (ref 0.44–1.00)
Chloride: 106 mmol/L (ref 101–111)
GFR calc non Af Amer: >60 mL/min (ref >60)
Glucose, Bld: 332 mg/dL — ABNORMAL HIGH (ref 65–99)
Potassium: 4.2 mmol/L (ref 3.5–5.1)
Sodium: 138 mmol/L (ref 135–145)

## 2016-10-04 LAB — URINALYSIS, MICROSCOPIC (REFLEX)
RBC / HPF: NONE SEEN RBC/hpf (ref 0–5)
WBC UA: NONE SEEN WBC/hpf (ref 0–5)

## 2016-10-04 LAB — CBG MONITORING, ED: GLUCOSE-CAPILLARY: 319 mg/dL — AB (ref 65–99)

## 2016-10-04 MED ORDER — INSULIN ASPART 100 UNIT/ML ~~LOC~~ SOLN
0.0000 [IU] | Freq: Three times a day (TID) | SUBCUTANEOUS | Status: DC
  Administered 2016-10-04: 8 [IU] via SUBCUTANEOUS

## 2016-10-04 MED ORDER — MOMETASONE FURO-FORMOTEROL FUM 200-5 MCG/ACT IN AERO
2.0000 | INHALATION_SPRAY | Freq: Two times a day (BID) | RESPIRATORY_TRACT | Status: DC
  Administered 2016-10-04: 2 via RESPIRATORY_TRACT
  Filled 2016-10-04: qty 8.8

## 2016-10-04 MED ORDER — HEPARIN SODIUM (PORCINE) 5000 UNIT/ML IJ SOLN: 5000.0000 [IU] | Freq: Three times a day (TID) | INTRAMUSCULAR | Status: DC

## 2016-10-04 MED ORDER — SENNOSIDES-DOCUSATE SODIUM 8.6-50 MG PO TABS: 1.0000 | ORAL_TABLET | Freq: Every evening | ORAL | Status: DC | PRN

## 2016-10-04 MED ORDER — INSULIN ASPART 100 UNIT/ML ~~LOC~~ SOLN: 0.0000 [IU] | Freq: Three times a day (TID) | SUBCUTANEOUS | Status: DC

## 2016-10-04 MED ORDER — DIAZEPAM 5 MG/ML IJ SOLN
2.5000 mg | Freq: Once | INTRAMUSCULAR | Status: AC
  Administered 2016-10-04: 2.5 mg via INTRAVENOUS
  Filled 2016-10-04: qty 2

## 2016-10-04 MED ORDER — ONDANSETRON HCL 4 MG/2ML IJ SOLN
INTRAMUSCULAR | Status: AC
  Filled 2016-10-04: qty 2

## 2016-10-04 MED ORDER — ASPIRIN EC 81 MG PO TBEC: 81.0000 mg | DELAYED_RELEASE_TABLET | Freq: Every day | ORAL | Status: DC

## 2016-10-04 MED ORDER — ACETAMINOPHEN 325 MG PO TABS: 650.0000 mg | ORAL_TABLET | ORAL | Status: DC | PRN

## 2016-10-04 MED ORDER — ONDANSETRON HCL 4 MG/2ML IJ SOLN
4.0000 mg | Freq: Once | INTRAMUSCULAR | Status: AC
  Administered 2016-10-04: 4 mg via INTRAVENOUS

## 2016-10-04 MED ORDER — ALBUTEROL SULFATE (2.5 MG/3ML) 0.083% IN NEBU: 2.5000 mg | INHALATION_SOLUTION | Freq: Four times a day (QID) | RESPIRATORY_TRACT | Status: DC | PRN

## 2016-10-04 MED ORDER — INSULIN ASPART 100 UNIT/ML ~~LOC~~ SOLN: 0.0000 [IU] | Freq: Every day | SUBCUTANEOUS | Status: DC

## 2016-10-04 MED ORDER — INSULIN DETEMIR 100 UNIT/ML ~~LOC~~ SOLN
44.0000 [IU] | Freq: Two times a day (BID) | SUBCUTANEOUS | Status: DC
  Filled 2016-10-04 (×2): qty 0.44

## 2016-10-04 MED ORDER — IOPAMIDOL (ISOVUE-370) INJECTION 76%
INTRAVENOUS | Status: AC
  Administered 2016-10-04: 50 mL
  Filled 2016-10-04: qty 50

## 2016-10-04 MED ORDER — STROKE: EARLY STAGES OF RECOVERY BOOK: Freq: Once | Status: DC

## 2016-10-04 MED ORDER — ACETAMINOPHEN 650 MG RE SUPP: 650.0000 mg | RECTAL | Status: DC | PRN

## 2016-10-04 MED ORDER — ATORVASTATIN CALCIUM 10 MG PO TABS
20.0000 mg | ORAL_TABLET | Freq: Every day | ORAL | Status: DC
  Administered 2016-10-04: 20 mg via ORAL
  Filled 2016-10-04: qty 2

## 2016-10-04 MED ORDER — ALBUTEROL SULFATE HFA 108 (90 BASE) MCG/ACT IN AERS: 1.0000 | INHALATION_SPRAY | RESPIRATORY_TRACT | Status: DC | PRN

## 2016-10-04 MED ORDER — ASPIRIN 81 MG PO CHEW
324.0000 mg | CHEWABLE_TABLET | Freq: Once | ORAL | Status: AC
  Administered 2016-10-04: 324 mg via ORAL
  Filled 2016-10-04: qty 4

## 2016-10-04 MED ORDER — ACETAMINOPHEN 160 MG/5ML PO SOLN: 650.0000 mg | ORAL | Status: DC | PRN

## 2016-10-04 MED ORDER — LORAZEPAM 2 MG/ML IJ SOLN: 1.0000 mg | Freq: Once | INTRAMUSCULAR | Status: DC

## 2016-10-04 NOTE — ED Triage Notes (Signed)
Patient states that she woke up between 530 to 600 in the morning with a sharp light and excrutiating headache. She then became acutely dizzy with N/V  - the patient reports that she is more dizzy when standing and feels better when laying down. Blood sugar is elevated, Bp is elvated, and patient denies a history of Headaches. Patient has weakness to her left arm noted.

## 2016-10-04 NOTE — H&P (Signed)
History and Physical    Jamie Strickland ZOX:[id number] DOB: 09-10-1945 DOA: 10/04/2016  PCP: Tommie Sams, DO  Patient coming from: Home  Chief Complaint: dizziness with nausea as well as Left leg and arm parathesias  HPI: Jamie Strickland is a 71 y.o. female with medical history significant of past medical history listed below patient states that this morning around 5:30 to 6 AM she saw a light and was followed with the above chief complaints. The problem has been persistent and is not getting better. Standing made her dizziness and nausea worse. She has had anti-emetics which has helped. Moving her head makes the dizziness worse. As far as the paresthesias is concern. She reports that has been a persistent problem since she experience the problems at 5:30 in the morning.  ED Course: Patient was evaluated at [facility] and presented for further evaluation here at this hospital specifically for MRI.  Review of Systems: As per HPI otherwise 10 point review of systems negative.    Past Medical History:  Diagnosis Date  . Allergic rhinitis   . Diabetes mellitus type II   . Hx of colonic polyps   . Hyperlipidemia   . Hypertension   . Osteoarthritis     Past Surgical History:  Procedure Laterality Date  . 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 CATARACT EXTRACTION W/ INTRAOCULAR LENS  IMPLANT, BILATERAL Bilateral 2014  . CATARACT EXTRACTION W/ INTRAOCULAR LENS  IMPLANT, BILATERAL  11/14  . CHOLECYSTECTOMY    . LASIK       reports that she has quit smoking. She quit smokeless tobacco use about 8 years ago. She reports that she drinks alcohol. She reports that she does not use drugs.  Allergies  Allergen Reactions  . Invokana [Canagliflozin] Nausea And Vomiting and Other (See Comments)    Yeast infections  . Codeine Nausea And Vomiting    REACTION: n /v    Family History  Problem Relation Age of Onset  . Diabetes Mother     . Heart disease Mother   . Diabetes Father        brittle diabetes  . Asthma Father 74  . Liver disease Father        liver problems  . Heart disease Father   . Heart disease Brother        CAD  . Cancer Maternal Aunt        Breast  . Heart disease Maternal Grandfather        CAD  . Cancer Brother        lung     Prior to Admission medications   Medication Sig Start Date End Date Taking? Authorizing Provider  ADVAIR DISKUS 250-50 MCG/DOSE AEPB INHALE 1 PUFF BY MOUTH TWICE DAILY 05/14/16  Yes Cook, Jayce G, DO  albuterol (PROVENTIL) (5 MG/ML) 0.5% nebulizer solution Take 0.5 mLs (2.5 mg total) by nebulization every 6 (six) hours as needed for wheezing or shortness of breath. 06/03/16  Yes Everlene Other G, DO  aspirin EC 81 MG tablet Take 81 mg by mouth daily.   Yes [provider]  atorvastatin (LIPITOR) 20 MG tablet TAKE 1 TABLET(20 MG) BY MOUTH DAILY 03/26/16  Yes Cook, Jayce G, DO  insulin detemir (LEVEMIR) 100 UNIT/ML injection Inject 0.44 mLs (44 Units total) into the skin 2 (two) times daily. 06/18/16  Yes Cook, Jayce G, DO  insulin NPH-regular Human (NOVOLIN 70/30) (70-30) 100  UNIT/ML injection Inject 17-27 Units into the skin 2 (two) times daily. Sliding scale   Yes [provider]  lisinopril (PRINIVIL,ZESTRIL) 20 MG tablet TAKE 1 TABLET(20 MG) BY MOUTH DAILY 07/28/16  Yes Cook, Jayce G, DO  metFORMIN (GLUCOPHAGE) 1000 MG tablet TAKE 1 TABLET BY MOUTH TWICE DAILY WITH A MEAL 07/28/16  Yes Cook, Jayce G, DO  tetrahydrozoline-zinc (VISINE-AC) 0.05-0.25 % ophthalmic solution Place 2 drops into both eyes 3 (three) times daily as needed (allergies).   Yes [provider]  albuterol (VENTOLIN HFA) 108 (90 Base) MCG/ACT inhaler INHALE 2 PUFFS INTO THE LUNGS FOUR TIMES DAILY AS NEEDED FOR WHEEZING OR SHORTNESS OF BREATH OR ASTHMA 05/18/16   Everlene Otherook, Jayce G, DO  Dulaglutide (TRULICITY) 0.75 MG/0.5ML SOPN Inject once weekly. Patient not taking: Reported on 10/04/2016  05/25/16   Tommie Samsook, Jayce G, DO  Dulaglutide (TRULICITY) 1.5 MG/0.5ML SOPN Inject 1.5 mg into the skin once a week. Use after you complete the 0.75 mg weekly samples Patient not taking: Reported on 10/04/2016 05/25/16   Tommie Samsook, Jayce G, DO    Physical Exam: Vitals:   10/04/16 1300 10/04/16 1309 10/04/16 1400 10/04/16 1508  BP: (!) 164/72  (!) 166/79 (!) 181/64  Pulse: 71  73 86  Resp: 10  14 18   Temp:  98 F (36.7 C)    TempSrc:      SpO2: 96%  97% 96%  Weight:      Height:        Constitutional: NAD, calm, comfortable Vitals:   10/04/16 1300 10/04/16 1309 10/04/16 1400 10/04/16 1508  BP: (!) 164/72  (!) 166/79 (!) 181/64  Pulse: 71  73 86  Resp: 10  14 18   Temp:  98 F (36.7 C)    TempSrc:      SpO2: 96%  97% 96%  Weight:      Height:       Eyes: PERRL, lids and conjunctivae normal ENMT: Mucous membranes are moist. Posterior pharynx clear of any exudate or lesions. Normal dentition.  Neck: normal, supple, no masses, no thyromegaly Respiratory: clear to auscultation bilaterally, no wheezing, no crackles. Normal respiratory effort. No accessory muscle use.  Cardiovascular: Regular rate and rhythm, no murmurs / rubs / gallops.  Abdomen: no tenderness, no masses palpated. No hepatosplenomegaly. Bowel sounds positive.  Musculoskeletal: no clubbing / cyanosis. No joint deformity upper and lower extremities. Good ROM, no contractures. Normal muscle tone.  Skin: no rashes, lesions, ulcers. No induration Neurologic: Patient moves extremities equally, no facial asymmetry, answers questions appropriately Psychiatric:  Alert and oriented x 3. Normal mood.    Labs on Admission: I have personally reviewed following labs and imaging studies  CBC:  Recent Labs Lab 10/04/16 1122  WBC 7.1  HGB 13.7  HCT 40.8  MCV 90.5  PLT 218   Basic Metabolic Panel:  Recent Labs Lab 10/04/16 1122  NA 138  K 4.2  CL 106  CO2 23  GLUCOSE 332*  BUN 17  CREATININE 0.55  CALCIUM 9.2    GFR: Estimated Creatinine Clearance: 72.4 mL/min (by C-G formula based on SCr of 0.55 mg/dL). Liver Function Tests: No results for input(s): AST, ALT, ALKPHOS, BILITOT, PROT, ALBUMIN in the last 168 hours. No results for input(s): LIPASE, AMYLASE in the last 168 hours. No results for input(s): AMMONIA in the last 168 hours. Coagulation Profile: No results for input(s): INR, PROTIME in the last 168 hours. Cardiac Enzymes: No results for input(s): CKTOTAL, CKMB, CKMBINDEX, TROPONINI in the  last 168 hours. BNP (last 3 results) No results for input(s): PROBNP in the last 8760 hours. HbA1C: No results for input(s): HGBA1C in the last 72 hours. CBG:  Recent Labs Lab 10/04/16 1058 10/04/16 1656  GLUCAP 319* 359*   Lipid Profile: No results for input(s): CHOL, HDL, LDLCALC, TRIG, CHOLHDL, LDLDIRECT in the last 72 hours. Thyroid Function Tests: No results for input(s): TSH, T4TOTAL, FREET4, T3FREE, THYROIDAB in the last 72 hours. Anemia Panel: No results for input(s): VITAMINB12, FOLATE, FERRITIN, TIBC, IRON, RETICCTPCT in the last 72 hours. Urine analysis:    Component Value Date/Time   COLORURINE YELLOW 10/04/2016 1221   APPEARANCEUR CLEAR 10/04/2016 1221   LABSPEC 1.027 10/04/2016 1221   PHURINE 5.5 10/04/2016 1221   GLUCOSEU >=500 (A) 10/04/2016 1221   HGBUR NEGATIVE 10/04/2016 1221   BILIRUBINUR NEGATIVE 10/04/2016 1221   KETONESUR NEGATIVE 10/04/2016 1221   PROTEINUR NEGATIVE 10/04/2016 1221   NITRITE NEGATIVE 10/04/2016 1221   LEUKOCYTESUR NEGATIVE 10/04/2016 1221    Radiological Exams on Admission: Ct Head Wo Contrast  Result Date: 10/04/2016 CLINICAL DATA:  Headache, stroke-like symptoms EXAM: CT HEAD WITHOUT CONTRAST TECHNIQUE: Contiguous axial images were obtained from the base of the skull through the vertex without intravenous contrast. COMPARISON:  None. FINDINGS: Brain: No evidence of acute infarction, hemorrhage, hydrocephalus, extra-axial collection or mass  lesion/mass effect. Vascular: No hyperdense vessel or unexpected calcification. Skull: Normal. Negative for fracture or focal lesion. Sinuses/Orbits: Near complete opacification of the right maxillary sinus. Visualized paranasal sinuses and mastoid air cells are otherwise clear. Other: None. IMPRESSION: No evidence of acute intracranial abnormality. Partial opacification of the right maxillary sinus. Electronically Signed   By: Charline Bills M.D.   On: 10/04/2016 11:21    EKG: Independently reviewed. Sinus rhythm with no ST elevations or depressions  Assessment/Plan Active Problems:   Stroke-like symptom - Stroke order set placed. Will expand work up (order carotid Dopplers and echocardiogram) and consult neurology and MRI positive for stroke  Essential hypertension -Hold blood pressure medication until stroke ruled out    Hyperlipemia   DM type 2, uncontrolled, with retinopathy (HCC) - Continue home long acting insulin regimen - Hold metformin - Monitor blood sugars    DVT prophylaxis: Heparin Code Status:Full Family Communication: none at bedside Disposition Plan: Telemetry Consults called: none: pending MRI results Admission status: observation   Penny Pia MD [facility] Pager [phone]  If 7PM-7AM, please contact night-coverage [email or web address] Password [facility]  10/04/2016, 5:36 PM

## 2016-10-04 NOTE — ED Notes (Signed)
Patient denies of light flashing at this time.  She stated that it was only one time while she was lying in bed at home.

## 2016-10-04 NOTE — ED Notes (Signed)
Patient stated that when she moves, she felt like the room is spinning and comes the nausea.

## 2016-10-04 NOTE — ED Notes (Signed)
Pt is being non-compliant and refuses to be placed on the cardiac monitor as ordered.

## 2016-10-04 NOTE — Consult Note (Signed)
Neurology Consultation Reason for Consult: Vertigo and left-sided numbness Referring Physician: Hillary Bow  CC: Left-sided numbness/vertigo  History is obtained from: Patient  HPI: Jamie Strickland is a 71 y.o. female he was awoken this morning with a flash of light that was extraordinarily brief (less than 1 second). When she sat up, she felt extremely vertiginous and noticed that she had some tingling/numbness of her left arm and leg as well. These symptoms have mostly resolved over the course of the day. She also complains of headache which is left-sided and retro-orbital. She denies photophobia.  She states that she has had 1 migraine in her life, it is nothing like this, and she had no neurological symptoms with it.  LKW: 5/26 prior to bed tpa given?: no, out of window   ROS: A 14 point ROS was performed and is negative except as noted in the HPI.   Past Medical History:  Diagnosis Date  . Allergic rhinitis   . Diabetes mellitus type II   . Hx of colonic polyps   . Hyperlipidemia   . Hypertension   . Osteoarthritis      Family History  Problem Relation Age of Onset  . Diabetes Mother   . Heart disease Mother   . Diabetes Father        brittle diabetes  . Asthma Father 25  . Liver disease Father        liver problems  . Heart disease Father   . Heart disease Brother        CAD  . Cancer Maternal Aunt        Breast  . Heart disease Maternal Grandfather        CAD  . Cancer Brother        lung     Social History:  reports that she has quit smoking. She quit smokeless tobacco use about 8 years ago. She reports that she drinks alcohol. She reports that she does not use drugs.   Exam: Current vital signs: BP (!) 181/64   Pulse 86   Temp 98 F (36.7 C)   Resp 18   Ht '5\' 2"'  (1.575 m)   Wt 97.5 kg (215 lb)   SpO2 96%   BMI 39.32 kg/m  Vital signs in last 24 hours: Temp:  [97.8 F (36.6 C)-98 F (36.7 C)] 98 F (36.7 C) (05/27 1309) Pulse Rate:  [67-86] 86  (05/27 1508) Resp:  [8-18] 18 (05/27 1508) BP: (163-209)/(64-82) 181/64 (05/27 1508) SpO2:  [96 %-97 %] 96 % (05/27 1508) Weight:  [97.5 kg (215 lb)] 97.5 kg (215 lb) (05/27 1051)   Physical Exam  Constitutional: Appears well-developed and well-nourished.  Psych: Affect appropriate to situation Eyes: No scleral injection HENT: No OP obstrucion Head: Normocephalic.  Cardiovascular: Normal rate and regular rhythm.  Respiratory: Effort normal and breath sounds normal to anterior ascultation GI: Soft.  No distension. There is no tenderness.  Skin: WDI  Neuro: Mental Status: Patient is awake, alert, oriented to person, place, month, year, and situation. Patient is able to give a clear and coherent history. No signs of aphasia or neglect Cranial Nerves: II: Visual Fields are full. Pupils are equal, round, and reactive to light.   III,IV, VI: EOMI without ptosis or diploplia.  V: Facial sensation is symmetric to temperature VII: Facial movement is symmetric.  VIII: hearing is intact to voice X: Uvula elevates symmetrically XI: Shoulder shrug is symmetric. XII: tongue is midline without atrophy or fasciculations.  Motor: Tone is normal. Bulk is normal. 5/5 strength was present in bilateral arms,? Some mild left lower extremity weakness but unclear if this is just due to positioning and effort. Sensory: Sensation is symmetric to light touch and temperature in the arms and legs. Cerebellar: FNF intact bilaterally   I have reviewed labs in epic and the results pertinent to this consultation are: Elevated glucose   I have reviewed the images obtained: CT head-negative  Impression: 71 year old female with multiple stroke risk factors who presents with transient vertigo, numbness, visual disturbance. The flashing of light would be an unusual symptom for stroke, but also the extremely brief nature of it (less than 1 second) would be extremely unusual for migraine aura or other  etiologies. My suspicion at this point, especially given her myriad risk factors, would be for TIA and I would favor treating this as such at this time.   Recommendations: 1. HgbA1c, fasting lipid panel 2. MRI of the brain without contrast 3. Frequent neuro checks 4. Echocardiogram 5. CT angiogram of the head and neck 6. Prophylactic therapy-Antiplatelet med: Aspirin - dose 363m PO or 3055mPR 7. Risk factor modification 8. Telemetry monitoring 9. PT consult, OT consult, Speech consult 10. please page stroke NP  Or  PA  Or MD  from 8am -4 pm as this patient will be followed by the stroke team at this point.   You can look them up on [email or web address]    McRoland RackMD [facility] [phone] 7pm- 7am, please page neurology on call as listed in [place]

## 2016-10-04 NOTE — ED Notes (Signed)
Pt's family member came out to desk and is being adamant that the patient is not going to be transported by ambulance to the admission hospital. Family member is starting to become verbally aggressive despite attempts to explain how the reasoning is to keep the patient safe. EDP made aware.

## 2016-10-04 NOTE — Progress Notes (Signed)
CT technician informed RN patient in need of 20g IV access in order to obtain CTA and patient refusing. RN spoke with patient and explained the importance of imaging, education provided, patient hung up telephone and returned to unit.

## 2016-10-04 NOTE — ED Provider Notes (Signed)
[facility] Provider Note   CSN: [id number] Arrival date & time: 10/04/16  1047     History   Chief Complaint Chief Complaint  Patient presents with  . Headache    HPI Jamie Strickland is a 71 y.o. female.  HPI   Patient is a 71 year old female with history of type 2 diabetes hypertension hyperlipidemia presenting today with dizziness and numbness to the left leg and arm. Patient has had mild cold symptoms for the last day. She reports that she witnessed sleep normally. She woke up with "blinding light". And pain behind her left eye. She has dizziness made worse by moving her head. And numbness to the left foot and arm.  Past Medical History:  Diagnosis Date  . Allergic rhinitis   . Diabetes mellitus type II   . Hx of colonic polyps   . Hyperlipidemia   . Hypertension   . Osteoarthritis     Patient Active Problem List   Diagnosis Date Noted  . Stroke-like symptom 10/04/2016  . Medically noncompliant 02/20/2015  . Mild nonproliferative diabetic retinopathy (HCC) 09/25/2014  . Actinic keratosis 11/13/2013  . Obesity 02/29/2012  . DM type 2, uncontrolled, with retinopathy (HCC) 08/31/2011  . Hyperlipemia 12/28/2007  . Essential hypertension, benign 12/28/2007  . Allergic rhinitis 12/28/2007  . Mild intermittent allergic asthma without complication 12/28/2007  . OSTEOARTHRITIS 12/28/2007  . COLONIC POLYPS, HX OF 12/28/2007    Past Surgical History:  Procedure Laterality Date  . 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 CATARACT EXTRACTION W/ INTRAOCULAR LENS  IMPLANT, BILATERAL Bilateral 2014  . CATARACT EXTRACTION W/ INTRAOCULAR LENS  IMPLANT, BILATERAL  11/14  . CHOLECYSTECTOMY    . LASIK      OB History    No data available       Home Medications    Prior to Admission medications   Medication Sig Start Date End Date Taking? Authorizing Provider  ADVAIR DISKUS 250-50 MCG/DOSE AEPB INHALE 1 PUFF BY  MOUTH TWICE DAILY 05/14/16   Everlene Other G, DO  albuterol (PROVENTIL) (5 MG/ML) 0.5% nebulizer solution Take 0.5 mLs (2.5 mg total) by nebulization every 6 (six) hours as needed for wheezing or shortness of breath. 06/03/16   Everlene Other G, DO  albuterol (VENTOLIN HFA) 108 (90 Base) MCG/ACT inhaler INHALE 2 PUFFS INTO THE LUNGS FOUR TIMES DAILY AS NEEDED FOR WHEEZING OR SHORTNESS OF BREATH OR ASTHMA 05/18/16   Tommie Sams, DO  aspirin EC 81 MG tablet Take 81 mg by mouth daily.    [provider]  atorvastatin (LIPITOR) 20 MG tablet TAKE 1 TABLET(20 MG) BY MOUTH DAILY 03/26/16   Cook, [place] G, DO  B-D ULTRAFINE III SHORT PEN 31G X 8 MM MISC USE AS DIRECTED 06/11/15   Doss, Oleh Genin, NP  Dulaglutide (TRULICITY) 0.75 MG/0.5ML SOPN Inject once weekly. 05/25/16   Tommie Sams, DO  Dulaglutide (TRULICITY) 1.5 MG/0.5ML SOPN Inject 1.5 mg into the skin once a week. Use after you complete the 0.75 mg weekly samples 05/25/16   Everlene Other G, DO  glucose blood (ACCU-CHEK SMARTVIEW) test strip TEST BLOOD SUGAR FOUR TIMES DAILY AS NEEDED E11.319 11/11/15   Everlene Other G, DO  insulin detemir (LEVEMIR) 100 UNIT/ML injection Inject 0.44 mLs (44 Units total) into the skin 2 (two) times daily. 06/18/16   Tommie Sams, DO  Insulin Syringe-Needle U-100 (INSULIN SYRINGE .5CC/30GX5/16") 30G X 5/16" 0.5 ML MISC USE  TO INJECT 3 TO 4 TIMES DAILY 08/02/15   Carollee Leitzoss, Carrie M, NP  lisinopril (PRINIVIL,ZESTRIL) 20 MG tablet TAKE 1 TABLET(20 MG) BY MOUTH DAILY 07/28/16   Adriana Simasook, MedinaJayce G, DO  metFORMIN (GLUCOPHAGE) 1000 MG tablet TAKE 1 TABLET BY MOUTH TWICE DAILY WITH A MEAL 07/28/16   Tommie Samsook, Jayce G, DO    Family History Family History  Problem Relation Age of Onset  . Diabetes Mother   . Heart disease Mother   . Diabetes Father        brittle diabetes  . Asthma Father [id number]  . Liver disease Father        liver problems  . Heart disease Father   . Heart disease Brother        CAD  . Cancer Maternal Aunt        Breast  .  Heart disease Maternal Grandfather        CAD  . Cancer Brother        lung    Social History Social History  Substance Use Topics  . Smoking status: Former Games developermoker  . Smokeless tobacco: Former NeurosurgeonUser    Quit date: 08/09/2008  . Alcohol use 0.0 oz/week     Comment: Rare     Allergies   Invokana [canagliflozin] and Codeine   Review of Systems Review of Systems  Constitutional: Negative for activity change.  HENT: Positive for congestion.   Respiratory: Negative for shortness of breath.   Cardiovascular: Negative for chest pain.  Gastrointestinal: Negative for abdominal pain.  Neurological: Positive for dizziness, weakness, light-headedness and numbness. Negative for syncope and speech difficulty.  All other systems reviewed and are negative.    Physical Exam Updated Vital Signs BP (!) 181/64   Pulse 86   Temp 98 F (36.7 C)   Resp 18   Ht 5\' 2"  (1.575 m)   Wt 97.5 kg (215 lb)   SpO2 96%   BMI 39.32 kg/m   Physical Exam  Constitutional: She is oriented to person, place, and time. She appears well-developed and well-nourished.  HENT:  Head: Normocephalic and atraumatic.  R ear with trace effusion  Eyes: Pupils are equal, round, and reactive to light. Right eye exhibits no discharge. Left eye exhibits no discharge.  Looking to left induced dizziness  Cardiovascular: Normal rate, regular rhythm and normal heart sounds.   No murmur heard. Pulmonary/Chest: Effort normal and breath sounds normal. She has no wheezes. She has no rales.  Abdominal: Soft. She exhibits no distension. There is no tenderness.  Neurological: She is oriented to person, place, and time. No cranial nerve deficit.  Mild dysmetria left hand.  Inducible dizziness when looking to the left.  Equal strength bilaterally upper and lower extremities negative pronator drift. Normal sensation bilaterally. Speech comprehensible, no slurring. Facial nerve tested and appears grossly normal. Alert and oriented  3.   Skin: Skin is warm and dry. She is not diaphoretic.  Psychiatric: She has a normal mood and affect.  Nursing note and vitals reviewed.    ED Treatments / Results  Labs (all labs ordered are listed, but only abnormal results are displayed) Labs Reviewed  BASIC METABOLIC PANEL - Abnormal; Notable for the following:       Result Value   Glucose, Bld 332 (*)    All other components within normal limits  URINALYSIS, ROUTINE W REFLEX MICROSCOPIC - Abnormal; Notable for the following:    Glucose, UA >=500 (*)    All other components within  normal limits  URINALYSIS, MICROSCOPIC (REFLEX) - Abnormal; Notable for the following:    Bacteria, UA FEW (*)    Squamous Epithelial / LPF 0-5 (*)    All other components within normal limits  CBG MONITORING, ED - Abnormal; Notable for the following:    Glucose-Capillary 319 (*)    All other components within normal limits  CBC    EKG  EKG Interpretation  Date/Time:  Sunday Oct 04 2016 10:59:42 EDT Ventricular Rate:  82 PR Interval:    QRS Duration: 88 QT Interval:  370 QTC Calculation: 433 R Axis:   75 Text Interpretation:  Sinus rhythm Anteroseptal infarct, old Normal sinus rhythm Confirmed by Bary Castilla ([id number]) on 10/04/2016 12:13:35 PM       Radiology Ct Head Wo Contrast  Result Date: 10/04/2016 CLINICAL DATA:  Headache, stroke-like symptoms EXAM: CT HEAD WITHOUT CONTRAST TECHNIQUE: Contiguous axial images were obtained from the base of the skull through the vertex without intravenous contrast. COMPARISON:  None. FINDINGS: Brain: No evidence of acute infarction, hemorrhage, hydrocephalus, extra-axial collection or mass lesion/mass effect. Vascular: No hyperdense vessel or unexpected calcification. Skull: Normal. Negative for fracture or focal lesion. Sinuses/Orbits: Near complete opacification of the right maxillary sinus. Visualized paranasal sinuses and mastoid air cells are otherwise clear. Other: None. IMPRESSION: No  evidence of acute intracranial abnormality. Partial opacification of the right maxillary sinus. Electronically Signed   By: Charline Bills M.D.   On: 10/04/2016 11:21    Procedures Procedures (including critical care time)  Medications Ordered in ED Medications  ondansetron (ZOFRAN) injection 4 mg (4 mg Intravenous Given 10/04/16 1223)  diazepam (VALIUM) injection 2.5 mg (2.5 mg Intravenous Given 10/04/16 1223)  aspirin chewable tablet 324 mg (324 mg Oral Given 10/04/16 1316)     Initial Impression / Assessment and Plan / ED Course  I have reviewed the triage vital signs and the nursing notes.  Pertinent labs & imaging results that were available during my care of the patient were reviewed by me and considered in my medical decision making (see chart for details).     Patient is a 71 year old female with hypertension hyperlipidemia and diabetes presenting with dizziness and numbness the left arm and leg. Patient's dizziness is more consistent with a peripheral given that it is worse with head movement. However patient does have some dizziness at rest. I would want to do an MRI to rule out posterior stroke. Patient also has numbness the left arm and left leg. No weakness. For this reason I also would like an  MRI. Given the symptoms, would like to admit to [facility] for MRI, stroke workup. Patient has continued numbness in this arm and leg.  3:22 PM  Discussed with neurology, consider migraine. Though currently patient is pain free and still has numbness/tingling to L hand and foot.   Patient's dizziness got better with the Valium.  Patient initially refusing to take [facility]. I discussed with patient and she is now amenable to [facility]. Patient continued with left upper and lower extremity numbness I do think it's appropriate to admit for stroke workup.  Final Clinical Impressions(s) / ED Diagnoses   Final diagnoses:  None    New Prescriptions New Prescriptions   No medications on  file     Abelino Derrick, MD 10/04/16 [phone]

## 2016-10-04 NOTE — Progress Notes (Signed)
This RN as Consulting civil engineercharge RN made aware that patient leaving AMA. Patient's RN still attempting to educate pt on importance of staying for duration of hospitalization until testing is complete as patient walking up hall fully dressed, patient stating "I'm leaving, I will not stay here any longer."  Patient RN already paged neuro MD to make aware, this RN paged hospitalist to make aware--Ortiz, MD talked to patient on phone.  IV d/c'd from patient. Patient only agreeable to initial AMA paper, not sign full name.  Patient left unit, heading to ED stating "my ride is on the way."; all patient belongings with patient.

## 2016-10-04 NOTE — Progress Notes (Signed)
[facility] Pt alert and oriented to unit. RN paged Admitting MD. Will continue to monitor.

## 2016-10-04 NOTE — Plan of Care (Signed)
  71 year old female past mental history of arthritis, hypertension, hyperlipidemia, diabetes and allergies presented [facility] with chief complaint of HA, dizziness, numbness of L arm and leg.  CT head negative for bleed or acute infarct but does show opacification partially of the right maxillary sinus  Course: Presented with concern for strokes. Consulted neuro who felt it was atypical migraine. Labs benign. Getting ASA.  Reason transferred: MRI  Status: tele obs

## 2016-10-12 ENCOUNTER — Other Ambulatory Visit: Payer: Self-pay

## 2016-10-12 MED ORDER — ATORVASTATIN CALCIUM 20 MG PO TABS: ORAL_TABLET | ORAL | 1 refills | Status: DC

## 2016-10-14 ENCOUNTER — Other Ambulatory Visit: Payer: Self-pay

## 2016-10-14 MED ORDER — ATORVASTATIN CALCIUM 20 MG PO TABS: ORAL_TABLET | ORAL | 1 refills | Status: DC

## 2016-11-05 ENCOUNTER — Other Ambulatory Visit: Payer: Self-pay | Admitting: Family Medicine

## 2016-11-05 DIAGNOSIS — E1165 Type 2 diabetes mellitus with hyperglycemia: Principal | ICD-10-CM

## 2016-11-05 DIAGNOSIS — Z794 Long term (current) use of insulin: Principal | ICD-10-CM

## 2016-11-05 DIAGNOSIS — E11319 Type 2 diabetes mellitus with unspecified diabetic retinopathy without macular edema: Secondary | ICD-10-CM

## 2016-11-06 NOTE — Telephone Encounter (Signed)
Refilled: 06/18/2016 Last OV: 05/25/2016 Next OV: not scheduled Last Labs: 04/27/2016

## 2016-11-09 ENCOUNTER — Other Ambulatory Visit: Payer: Self-pay

## 2016-11-10 ENCOUNTER — Other Ambulatory Visit: Payer: Self-pay

## 2016-11-10 MED ORDER — INSULIN PEN NEEDLE 31G X 8 MM MISC: 6 refills | Status: DC

## 2016-11-10 MED ORDER — GLUCOSE BLOOD VI STRP: ORAL_STRIP | 12 refills | Status: DC

## 2016-11-13 ENCOUNTER — Encounter: Payer: Self-pay | Admitting: Physician Assistant

## 2016-11-13 ENCOUNTER — Ambulatory Visit (INDEPENDENT_AMBULATORY_CARE_PROVIDER_SITE_OTHER): Payer: Medicare Other | Admitting: Physician Assistant

## 2016-11-13 VITALS — BP 150/77 | HR 96 | Wt 216.0 lb

## 2016-11-13 DIAGNOSIS — Z7689 Persons encountering health services in other specified circumstances: Secondary | ICD-10-CM | POA: Diagnosis not present

## 2016-11-13 DIAGNOSIS — E785 Hyperlipidemia, unspecified: Secondary | ICD-10-CM | POA: Diagnosis not present

## 2016-11-13 DIAGNOSIS — I10 Essential (primary) hypertension: Secondary | ICD-10-CM | POA: Diagnosis not present

## 2016-11-13 DIAGNOSIS — E119 Type 2 diabetes mellitus without complications: Secondary | ICD-10-CM

## 2016-11-13 DIAGNOSIS — E11319 Type 2 diabetes mellitus with unspecified diabetic retinopathy without macular edema: Secondary | ICD-10-CM

## 2016-11-13 DIAGNOSIS — Z794 Long term (current) use of insulin: Secondary | ICD-10-CM

## 2016-11-13 DIAGNOSIS — E1165 Type 2 diabetes mellitus with hyperglycemia: Secondary | ICD-10-CM

## 2016-11-13 DIAGNOSIS — E1159 Type 2 diabetes mellitus with other circulatory complications: Secondary | ICD-10-CM | POA: Insufficient documentation

## 2016-11-13 DIAGNOSIS — J0110 Acute frontal sinusitis, unspecified: Secondary | ICD-10-CM

## 2016-11-13 LAB — POCT GLYCOSYLATED HEMOGLOBIN (HGB A1C): HEMOGLOBIN A1C: 9.5

## 2016-11-13 MED ORDER — ATORVASTATIN CALCIUM 20 MG PO TABS: ORAL_TABLET | ORAL | 1 refills | Status: DC

## 2016-11-13 MED ORDER — LISINOPRIL-HYDROCHLOROTHIAZIDE 20-25 MG PO TABS: 1.0000 | ORAL_TABLET | Freq: Every day | ORAL | 1 refills | Status: DC

## 2016-11-13 MED ORDER — CEFUROXIME AXETIL 250 MG PO TABS: 250.0000 mg | ORAL_TABLET | Freq: Two times a day (BID) | ORAL | 0 refills | Status: AC

## 2016-11-13 MED ORDER — METFORMIN HCL 1000 MG PO TABS: ORAL_TABLET | ORAL | 0 refills | Status: DC

## 2016-11-13 NOTE — Progress Notes (Signed)
HPI:                                                                Jamie Strickland is a 71 y.o. female who presents to [name]: Primary Care [facility] Medicine today to establish care   Current Concerns include sinus problem  DMII: taking metformin 1000 mg BID, Levemir 44 units BID, Humalog sliding scale twice a day. Compliant with medications. Last A1C 10.4, 6 months ago. Checks blood sugars at home. Blood sugar range 150-250. Denies polyuria, vision change, and paresthesias. Denies recent hypoglycemic events. Denies ulcers/wounds on feet.  Eye exam: overdue Foot exam: overdue  HTN: taking Lisinopril 20mg  daily. Compliant with medications. Denies vision change, headache, chest pain with exertion, orthopnea, lightheadedness, syncope and edema. Risk factors include: DM II  Patient reports sinus congestion and pressure ongoing for over 1 month. Recently developed left sided ear fullness and muffled hearing. Denies cough, shortness of breath, wheezing. CT head 10/04/16 showed partial opacification of the right maxillary sinus.   Health Maintenance Health Maintenance  Topic Date Due  . Hepatitis C Screening  [birth year]  . OPHTHALMOLOGY EXAM  09/11/2015  . FOOT EXAM  08/18/2016  . HEMOGLOBIN A1C  10/26/2016  . MAMMOGRAM  09/07/2017 (Originally 07/05/2016)  . DEXA SCAN  09/07/2017 (Originally 05/30/2012)  . INFLUENZA VACCINE  12/09/2016  . TETANUS/TDAP  12/27/2017  . COLONOSCOPY  09/02/2024  . PNA vac Low Risk Adult  Completed    Past Medical History:  Diagnosis Date  . Allergic rhinitis   . Diabetes mellitus type II   . Hx of colonic polyps   . Hyperlipidemia   . Hypertension   . Osteoarthritis    Past Surgical History:  Procedure Laterality Date  . 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 CATARACT EXTRACTION W/ INTRAOCULAR LENS  IMPLANT, BILATERAL Bilateral 2014  . CATARACT EXTRACTION W/ INTRAOCULAR LENS  IMPLANT,  BILATERAL  11/14  . CHOLECYSTECTOMY    . LASIK     Social History  Substance Use Topics  . Smoking status: Former Games developer  . Smokeless tobacco: Former Neurosurgeon    Quit date: 08/09/2008  . Alcohol use 0.0 oz/week     Comment: Rare   family history includes Asthma (age of onset: 36) in her father; Cancer in her brother and maternal aunt; Diabetes in her father and mother; Heart disease in her brother, father, maternal grandfather, and mother; Liver disease in her father.  ROS: negative except as noted in the HPI  Medications: Current Outpatient Prescriptions  Medication Sig Dispense Refill  . ADVAIR DISKUS 250-50 MCG/DOSE AEPB INHALE 1 PUFF BY MOUTH TWICE DAILY 60 each 6  . albuterol (PROVENTIL) (5 MG/ML) 0.5% nebulizer solution Take 0.5 mLs (2.5 mg total) by nebulization every 6 (six) hours as needed for wheezing or shortness of breath. 20 mL 2  . albuterol (VENTOLIN HFA) 108 (90 Base) MCG/ACT inhaler INHALE 2 PUFFS INTO THE LUNGS FOUR TIMES DAILY AS NEEDED FOR WHEEZING OR SHORTNESS OF BREATH OR ASTHMA 18 g 0  . aspirin EC 81 MG tablet Take 81 mg by mouth daily.    Marland Kitchen atorvastatin (LIPITOR) 20 MG tablet TAKE 1 TABLET(20 MG) BY MOUTH DAILY 90 tablet 1  .  glucose blood test strip Use as instructed 100 each 12  . insulin NPH-regular Human (NOVOLIN 70/30) (70-30) 100 UNIT/ML injection Inject 17-27 Units into the skin 2 (two) times daily. Sliding scale    . Insulin Pen Needle (B-D ULTRAFINE III SHORT PEN) 31G X 8 MM MISC Use as directed 100 each 6  . LEVEMIR 100 UNIT/ML injection INJECT 44 UNITS UNDER THE SKIN TWICE A DAY 30 mL 1  . lisinopril (PRINIVIL,ZESTRIL) 20 MG tablet TAKE 1 TABLET(20 MG) BY MOUTH DAILY 90 tablet 0  . metFORMIN (GLUCOPHAGE) 1000 MG tablet TAKE 1 TABLET BY MOUTH TWICE DAILY WITH A MEAL 180 tablet 0   No current facility-administered medications for this visit.    Allergies  Allergen Reactions  . Invokana [Canagliflozin] Nausea And Vomiting and Other (See Comments)     Yeast infections  . Codeine Nausea And Vomiting    REACTION: n /v    Objective:  BP (!) 150/77   Pulse 96   Wt 216 lb (98 kg)   SpO2 96%   BMI 39.51 kg/m  Gen: well-groomed, cooperative, not ill-appearing, no distress HEENT: normal conjunctiva, TM's clear, nasal mucosa edematous, oropharynx clear, moist mucus membranes, there is left frontal sinus tenderness, neck supple, no cervical lymphadenopathy Pulm: Normal work of breathing, normal phonation, clear to auscultation bilaterally CV: Normal rate, regular rhythm, s1 and s2 distinct, no murmurs, clicks or rubs, no carotid bruit Neuro: alert and oriented x 3, EOM's intact, PERRLA, normal tone, no tremor MSK: moving all extremities, normal gait and station, no peripheral edema Skin: warm and dry, no rashes on exposed skin Psych: normal affect, euthymic mood, normal speech and thought content  Depression screen [facility] 2/9 11/13/2016 08/17/2016 08/19/2015  Decreased Interest 0 0 0  Down, Depressed, Hopeless 0 0 0  PHQ - 2 Score 0 0 0     Results for orders placed or performed in visit on 11/13/16 (from the past 72 hour(s))  POCT HgB A1C     Status: None   Collection Time: 11/13/16  9:34 AM  Result Value Ref Range   Hemoglobin A1C 9.5    No results found.    Assessment and Plan: 71 y.o. female with   1. Encounter to establish care - reviewed PMH - reviewed Health Maintenance  2. Uncontrolled type 2 diabetes mellitus with retinopathy, with long-term current use of insulin, macular edema presence unspecified, unspecified laterality, unspecified retinopathy severity (HCC) - POCT HgB A1C 9.8, not at goal despite long-acting and sliding scale insulin. Referring to endocrinology - metFORMIN (GLUCOPHAGE) 1000 MG tablet; TAKE 1 TABLET BY MOUTH TWICE DAILY WITH A MEAL  Dispense: 180 tablet; Refill: 0 - atorvastatin (LIPITOR) 20 MG tablet; TAKE 1 TABLET(20 MG) BY MOUTH DAILY  Dispense: 90 tablet; Refill: 1 - Ambulatory referral to  Endocrinology  3. Hypertension goal BP (blood pressure) < 140/90 BP Readings from Last 3 Encounters:  11/13/16 (!) 150/77  10/04/16 (!) 199/65  08/17/16 (!) 150/80  - adding thiazide to ACE - lisinopril-hydrochlorothiazide (PRINZIDE,ZESTORETIC) 20-25 MG tablet; Take 1 tablet by mouth daily.  Dispense: 90 tablet; Refill: 1 - recheck blood pressure in 2 weeks. Patient states she will contact the office with her readings  4. Hyperlipidemia LDL goal <70 - atorvastatin (LIPITOR) 20 MG tablet; TAKE 1 TABLET(20 MG) BY MOUTH DAILY  Dispense: 90 tablet; Refill: 1  5. Acute non-recurrent frontal sinusitis - personally reviewed CT Head from 09/2016, showing opacification of the maxillary sinus. Patient was nontender over the right  maxillary sinus. Will treat since evidence of sinusitis on imaging and symptoms present for over 1 month - cefUROXime (CEFTIN) 250 MG tablet; Take 1 tablet (250 mg total) by mouth 2 (two) times daily with a meal.  Dispense: 20 tablet; Refill: 0  Patient education and anticipatory guidance given Patient agrees with treatment plan Follow-up in 3 months for HTN or sooner as needed  Levonne Hubertharley E. Cummings PA-C

## 2016-11-14 ENCOUNTER — Encounter: Payer: Self-pay | Admitting: Physician Assistant

## 2016-12-30 DIAGNOSIS — E1169 Type 2 diabetes mellitus with other specified complication: Secondary | ICD-10-CM | POA: Diagnosis not present

## 2016-12-30 DIAGNOSIS — E785 Hyperlipidemia, unspecified: Secondary | ICD-10-CM

## 2016-12-30 DIAGNOSIS — E1159 Type 2 diabetes mellitus with other circulatory complications: Secondary | ICD-10-CM | POA: Diagnosis not present

## 2016-12-30 DIAGNOSIS — E1140 Type 2 diabetes mellitus with diabetic neuropathy, unspecified: Secondary | ICD-10-CM | POA: Diagnosis not present

## 2016-12-30 DIAGNOSIS — Z794 Long term (current) use of insulin: Secondary | ICD-10-CM | POA: Diagnosis not present

## 2016-12-30 DIAGNOSIS — E1165 Type 2 diabetes mellitus with hyperglycemia: Secondary | ICD-10-CM | POA: Diagnosis not present

## 2016-12-30 DIAGNOSIS — I10 Essential (primary) hypertension: Secondary | ICD-10-CM | POA: Diagnosis not present

## 2016-12-31 ENCOUNTER — Other Ambulatory Visit: Payer: Self-pay | Admitting: Physician Assistant

## 2016-12-31 DIAGNOSIS — E1165 Type 2 diabetes mellitus with hyperglycemia: Principal | ICD-10-CM

## 2016-12-31 DIAGNOSIS — Z794 Long term (current) use of insulin: Principal | ICD-10-CM

## 2016-12-31 DIAGNOSIS — E11319 Type 2 diabetes mellitus with unspecified diabetic retinopathy without macular edema: Secondary | ICD-10-CM

## 2017-01-17 ENCOUNTER — Other Ambulatory Visit: Payer: Self-pay | Admitting: Family Medicine

## 2017-01-17 DIAGNOSIS — E11319 Type 2 diabetes mellitus with unspecified diabetic retinopathy without macular edema: Secondary | ICD-10-CM

## 2017-01-17 DIAGNOSIS — E1165 Type 2 diabetes mellitus with hyperglycemia: Principal | ICD-10-CM

## 2017-01-17 DIAGNOSIS — Z794 Long term (current) use of insulin: Principal | ICD-10-CM

## 2017-01-27 ENCOUNTER — Other Ambulatory Visit: Payer: Self-pay

## 2017-01-27 NOTE — Telephone Encounter (Signed)
Last Filled 12/17/16

## 2017-01-28 MED ORDER — LEVEMIR 100 UNIT/ML ~~LOC~~ SOLN: SUBCUTANEOUS | 1 refills | Status: DC

## 2017-03-15 DIAGNOSIS — E785 Hyperlipidemia, unspecified: Secondary | ICD-10-CM | POA: Diagnosis not present

## 2017-03-15 DIAGNOSIS — E1165 Type 2 diabetes mellitus with hyperglycemia: Secondary | ICD-10-CM | POA: Diagnosis not present

## 2017-03-15 DIAGNOSIS — E1159 Type 2 diabetes mellitus with other circulatory complications: Secondary | ICD-10-CM | POA: Diagnosis not present

## 2017-03-15 DIAGNOSIS — I10 Essential (primary) hypertension: Secondary | ICD-10-CM | POA: Diagnosis not present

## 2017-03-15 DIAGNOSIS — Z794 Long term (current) use of insulin: Secondary | ICD-10-CM | POA: Diagnosis not present

## 2017-03-15 DIAGNOSIS — E1140 Type 2 diabetes mellitus with diabetic neuropathy, unspecified: Secondary | ICD-10-CM | POA: Diagnosis not present

## 2017-03-15 DIAGNOSIS — E1169 Type 2 diabetes mellitus with other specified complication: Secondary | ICD-10-CM | POA: Diagnosis not present

## 2017-05-17 ENCOUNTER — Ambulatory Visit: Payer: Medicare Other | Admitting: Physician Assistant

## 2017-06-21 ENCOUNTER — Other Ambulatory Visit: Payer: Self-pay | Admitting: Family Medicine

## 2017-06-28 DIAGNOSIS — I10 Essential (primary) hypertension: Secondary | ICD-10-CM | POA: Diagnosis not present

## 2017-06-28 DIAGNOSIS — J4520 Mild intermittent asthma, uncomplicated: Secondary | ICD-10-CM | POA: Diagnosis not present

## 2017-06-28 DIAGNOSIS — J0140 Acute pansinusitis, unspecified: Secondary | ICD-10-CM | POA: Diagnosis not present

## 2017-08-02 ENCOUNTER — Ambulatory Visit (INDEPENDENT_AMBULATORY_CARE_PROVIDER_SITE_OTHER): Payer: Medicare Other

## 2017-08-02 ENCOUNTER — Encounter: Payer: Self-pay | Admitting: Physician Assistant

## 2017-08-02 ENCOUNTER — Ambulatory Visit (INDEPENDENT_AMBULATORY_CARE_PROVIDER_SITE_OTHER): Payer: Medicare Other | Admitting: Physician Assistant

## 2017-08-02 VITALS — BP 127/73 | HR 95 | Temp 97.7°F | Resp 20 | Wt 213.0 lb

## 2017-08-02 DIAGNOSIS — I152 Hypertension secondary to endocrine disorders: Secondary | ICD-10-CM

## 2017-08-02 DIAGNOSIS — E1159 Type 2 diabetes mellitus with other circulatory complications: Secondary | ICD-10-CM | POA: Diagnosis not present

## 2017-08-02 DIAGNOSIS — R05 Cough: Secondary | ICD-10-CM

## 2017-08-02 DIAGNOSIS — IMO0002 Reserved for concepts with insufficient information to code with codable children: Secondary | ICD-10-CM

## 2017-08-02 DIAGNOSIS — I10 Essential (primary) hypertension: Secondary | ICD-10-CM

## 2017-08-02 DIAGNOSIS — R0609 Other forms of dyspnea: Secondary | ICD-10-CM | POA: Diagnosis not present

## 2017-08-02 DIAGNOSIS — E11319 Type 2 diabetes mellitus with unspecified diabetic retinopathy without macular edema: Secondary | ICD-10-CM

## 2017-08-02 DIAGNOSIS — J4531 Mild persistent asthma with (acute) exacerbation: Secondary | ICD-10-CM

## 2017-08-02 DIAGNOSIS — R609 Edema, unspecified: Secondary | ICD-10-CM

## 2017-08-02 DIAGNOSIS — R600 Localized edema: Secondary | ICD-10-CM

## 2017-08-02 DIAGNOSIS — Z1231 Encounter for screening mammogram for malignant neoplasm of breast: Secondary | ICD-10-CM | POA: Diagnosis not present

## 2017-08-02 DIAGNOSIS — E1165 Type 2 diabetes mellitus with hyperglycemia: Secondary | ICD-10-CM | POA: Diagnosis not present

## 2017-08-02 LAB — POCT GLYCOSYLATED HEMOGLOBIN (HGB A1C): HEMOGLOBIN A1C: 8.1

## 2017-08-02 MED ORDER — PREDNISONE 20 MG PO TABS
40.0000 mg | ORAL_TABLET | Freq: Every day | ORAL | 0 refills | Status: AC
Start: 2017-08-02 — End: 2017-08-07

## 2017-08-02 MED ORDER — FLUTICASONE-SALMETEROL 250-50 MCG/DOSE IN AEPB: 1.0000 | INHALATION_SPRAY | Freq: Two times a day (BID) | RESPIRATORY_TRACT | 6 refills | Status: DC

## 2017-08-02 MED ORDER — AZITHROMYCIN 250 MG PO TABS: ORAL_TABLET | ORAL | 0 refills | Status: DC

## 2017-08-02 MED ORDER — ALBUTEROL SULFATE HFA 108 (90 BASE) MCG/ACT IN AERS: 1.0000 | INHALATION_SPRAY | RESPIRATORY_TRACT | 5 refills | Status: DC | PRN

## 2017-08-02 MED ORDER — ALBUTEROL SULFATE (5 MG/ML) 0.5% IN NEBU: 2.5000 mg | INHALATION_SOLUTION | RESPIRATORY_TRACT | 5 refills | Status: DC | PRN

## 2017-08-02 MED ORDER — IPRATROPIUM BROMIDE HFA 17 MCG/ACT IN AERS: 2.0000 | INHALATION_SPRAY | Freq: Four times a day (QID) | RESPIRATORY_TRACT | 5 refills | Status: DC | PRN

## 2017-08-02 NOTE — Patient Instructions (Addendum)
Asthma Attack Prevention, Adult Although you may not be able to control the fact that you have asthma, you can take actions to prevent episodes of asthma (asthma attacks). These actions include:  Creating a written plan for managing and treating your asthma attacks (asthma action plan).  Monitoring your asthma.  Avoiding things that can irritate your airways or make your asthma symptoms worse (asthma triggers).  Taking your medicines as directed.  Acting quickly if you have signs or symptoms of an asthma attack.  What are some ways to prevent an asthma attack? Create a plan Work with your health care provider to create an asthma action plan. This plan should include:  A list of your asthma triggers and how to avoid them.  A list of symptoms that you experience during an asthma attack.  Information about when to take medicine and how much medicine to take.  Information to help you understand your peak flow measurements.  Contact information for your health care providers.  Daily actions that you can take to control asthma.  Monitor your asthma  To monitor your asthma:  Use your peak flow meter every morning and every evening for 2-3 weeks. Record the results in a journal. A drop in your peak flow numbers on one or more days may mean that you are starting to have an asthma attack, even if you are not having symptoms.  When you have asthma symptoms, write them down in a journal.  Avoid asthma triggers  Work with your health care provider to find out what your asthma triggers are. This can be done by:  Being tested for allergies.  Keeping a journal that notes when asthma attacks occur and what may have contributed to them.  Asking your health care provider whether other medical conditions make your asthma worse.  Common asthma triggers include:  Dust.  Smoke. This includes campfire smoke and secondhand smoke from tobacco products.  Pet dander.  Trees, grasses or  pollens.  Very cold, dry, or humid air.  Mold.  Foods that contain high amounts of sulfites.  Strong smells.  Engine exhaust and air pollution.  Aerosol sprays and fumes from household cleaners.  Household pests and their droppings, including dust mites and cockroaches.  Certain medicines, including NSAIDs.  Once you have determined your asthma triggers, take steps to avoid them. Depending on your triggers, you may be able to reduce the chance of an asthma attack by:  Keeping your home clean. Have someone dust and vacuum your home for you 1 or 2 times a week. If possible, have them use a high-efficiency particulate arrestance (HEPA) vacuum.  Washing your sheets weekly in hot water.  Using allergy-proof mattress covers and casings on your bed.  Keeping pets out of your home.  Taking care of mold and water problems in your home.  Avoiding areas where people smoke.  Avoiding using strong perfumes or odor sprays.  Avoid spending a lot of time outdoors when pollen counts are high and on very windy days.  Talking with your health care provider before stopping or starting any new medicines.  Medicines Take over-the-counter and prescription medicines only as told by your health care provider. Many asthma attacks can be prevented by carefully following your medicine schedule. Taking your medicines correctly is especially important when you cannot avoid certain asthma triggers. Even if you are doing well, do not stop taking your medicine and do not take less medicine. Act quickly If an asthma attack happens, acting quickly   can decrease how severe it is and how long it lasts. Take these actions:  Pay attention to your symptoms. If you are coughing, wheezing, or having difficulty breathing, do not wait to see if your symptoms go away on their own. Follow your asthma action plan.  If you have followed your asthma action plan and your symptoms are not improving, call your health care  provider or seek immediate medical care at the nearest hospital.  It is important to write down how often you need to use your fast-acting rescue inhaler. You can track how often you use an inhaler in your journal. If you are using your rescue inhaler more often, it may mean that your asthma is not under control. Adjusting your asthma treatment plan may help you to prevent future asthma attacks and help you to gain better control of your condition. How can I prevent an asthma attack when I exercise?  Exercise is a common asthma trigger. To prevent asthma attacks during exercise:  Follow advice from your health care provider about whether you should use your fast-acting inhaler before exercising. Many people with asthma experience exercise-induced bronchoconstriction (EIB). This condition often worsens during vigorous exercise in cold, humid, or dry environments. Usually, people with EIB can stay very active by using a fast-acting inhaler before exercising.  Avoid exercising outdoors in very cold or humid weather.  Avoid exercising outdoors when pollen counts are high.  Warm up and cool down when exercising.  Stop exercising right away if asthma symptoms start.  Consider taking part in exercises that are less likely to cause asthma symptoms such as:  Indoor swimming.  Biking.  Walking.  Hiking.  Playing football.  This information is not intended to replace advice given to you by your health care provider. Make sure you discuss any questions you have with your health care provider. Document Released: 04/15/2009 Document Revised: 12/27/2015 Document Reviewed: 10/12/2015 Elsevier Interactive Patient Education  2018 [employer]  

## 2017-08-02 NOTE — Progress Notes (Signed)
HPI:                                                                Jamie Strickland is a 72 y.o. female who presents to [name]: Primary Care Sports Medicine today for medication management  DMII: followed by Dr. Shawnee Knapp, [facility]. Reports she wants a new endocrinologist. Currently taking Soliqua 38 units daily in addition to Metformin ER 1000 bid. Last a1c 10.3, 4 months ago. She is overdue for follow-up.  Reports worsening dyspnea on exertion and decreased exercise tolerance for the last 2 weeks. Noted swelling in her lower legs that has since resolved. Endorses orthopnea, using 4 pillows at night. Endorses productive cough with "buckets of mucus" and upper abdominal fullness. Has been using Atrovent 4 times daily and Albuterol every 4 hours in addition to her controller (Advair) without relief. Last asthma exacerbation over 1 year ago, January 2018  No flowsheet data found.    Past Medical History:  Diagnosis Date  . Allergic rhinitis   . Diabetes mellitus type II   . Hx of colonic polyps   . Hyperlipidemia   . Hypertension   . Mild intermittent allergic asthma without complication 12/28/2007   Qualifier: Diagnosis of  By: Alphonsus Sias MD, Ronnette Hila   . Mild nonproliferative diabetic retinopathy (HCC) 09/25/2014   Dxed Dr Blair Promise, 09/11/2014.   Changes noted in bilateral eyes?   . Osteoarthritis    Past Surgical History:  Procedure Laterality Date  . 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 CATARACT EXTRACTION W/ INTRAOCULAR LENS  IMPLANT, BILATERAL Bilateral 2014  . CATARACT EXTRACTION W/ INTRAOCULAR LENS  IMPLANT, BILATERAL  11/14  . CHOLECYSTECTOMY    . LASIK     Social History   Tobacco Use  . Smoking status: Former Games developer  . Smokeless tobacco: Former Neurosurgeon    Quit date: 08/09/2008  Substance Use Topics  . Alcohol use: Yes    Alcohol/week: 0.0 oz    Comment: Rare   family history includes  Asthma (age of onset: 65) in her father; Cancer in her brother and maternal aunt; Diabetes in her father and mother; Heart disease in her brother, father, maternal grandfather, and mother; Liver disease in her father.    ROS: negative except as noted in the HPI  Medications: Current Outpatient Medications  Medication Sig Dispense Refill  . albuterol (PROVENTIL) (5 MG/ML) 0.5% nebulizer solution Take 0.5 mLs (2.5 mg total) by nebulization every 4 (four) hours as needed for wheezing or shortness of breath. 20 mL 5  . albuterol (VENTOLIN HFA) 108 (90 Base) MCG/ACT inhaler Inhale 1-2 puffs into the lungs every 4 (four) hours as needed for wheezing or shortness of breath. 2 Inhaler 5  . aspirin EC 81 MG tablet Take 81 mg by mouth daily.    Marland Kitchen atorvastatin (LIPITOR) 20 MG tablet TAKE 1 TABLET(20 MG) BY MOUTH DAILY 90 tablet 1  . Fluticasone-Salmeterol (ADVAIR DISKUS) 250-50 MCG/DOSE AEPB Inhale 1 puff into the lungs 2 (two) times daily. 60 each 6  . glucose blood test strip Use as instructed 100 each 12  . Insulin Glargine-Lixisenatide 100-33 UNT-MCG/ML SOPN Inject 38 Units into the skin every morning.    Marland Kitchen  Insulin Pen Needle (B-D ULTRAFINE III SHORT PEN) 31G X 8 MM MISC Use as directed 100 each 6  . lisinopril-hydrochlorothiazide (PRINZIDE,ZESTORETIC) 20-25 MG tablet Take 1 tablet by mouth daily. 90 tablet 1  . metFORMIN (GLUCOPHAGE-XR) 500 MG 24 hr tablet Take 1,000 mg by mouth 2 (two) times daily with a meal.    . azithromycin (ZITHROMAX Z-PAK) 250 MG tablet Take 2 tablets (500 mg) on  Day 1,  followed by 1 tablet (250 mg) once daily on Days 2 through 5. 6 tablet 0  . ipratropium (ATROVENT HFA) 17 MCG/ACT inhaler Inhale 2-3 puffs into the lungs every 6 (six) hours as needed for wheezing. 1 Inhaler 5  . predniSONE (DELTASONE) 20 MG tablet Take 2 tablets (40 mg total) by mouth daily with breakfast for 5 days. 10 tablet 0   No current facility-administered medications for this visit.    Allergies   Allergen Reactions  . Invokana [Canagliflozin] Nausea And Vomiting and Other (See Comments)    Yeast infections  . Codeine Nausea And Vomiting    REACTION: n /v       Objective:  BP 127/73   Pulse 95   Temp 97.7 F (36.5 C) (Oral)   Resp 20   Wt 213 lb (96.6 kg)   SpO2 97%   BMI 38.96 kg/m  Gen:  alert, not ill-appearing, no distress, appropriate for age, obese female HEENT: head normocephalic without obvious abnormality, conjunctiva and cornea clear, neck supple, trachea midline Pulm: Normal work of breathing, normal phonation, breath sounds diminished on expiration; no wheezes, rales or rhonchi CV: normal rate, regular rhythm, s1 and s2 distinct, no murmurs, clicks or rubs, no JVD, radial pulses 2+ symmetric  Neuro: alert and oriented x 3, no tremor MSK: extremities atraumatic, normal gait and station, trace peripheral edema Skin: intact, no rashes on exposed skin, no jaundice, no cyanosis    Diabetic Foot Exam - Simple   Simple Foot Form Diabetic Foot exam was performed with the following findings:  Yes 08/02/2017  3:41 PM  Visual Inspection Sensation Testing Pulse Check Comments     Results for orders placed or performed in visit on 08/02/17 (from the past 72 hour(s))  POCT HgB A1C     Status: None   Collection Time: 08/02/17  3:41 PM  Result Value Ref Range   Hemoglobin A1C 8.1    Dg Chest 2 View  Result Date: 08/02/2017 CLINICAL DATA:  Dyspnea on exertion.  Cough.  Peripheral edema. EXAM: CHEST - 2 VIEW COMPARISON:  05/15/2016 FINDINGS: Cardiac silhouette is normal in size and configuration. No mediastinal or hilar masses. No evidence of adenopathy. Lungs are hyperexpanded, with mild reticular subsegmental atelectasis or scarring noted in the bases. No evidence of pneumonia or pulmonary edema. No pleural effusion or pneumothorax. Skeletal structures are unremarkable. IMPRESSION: 1. No acute cardiopulmonary disease. 2. Hyperexpanded lungs consistent with COPD.  Electronically Signed   By: Amie Portland M.D.   On: 08/02/2017 17:05      Assessment and Plan: 72 y.o. female with   1. Mild persistent asthma with acute exacerbation - personally reviewed CXR, blunting of right costophrenic angle; interpreted as lung hyperinflation and mild reticular scarring by radiology. No hx of spirometry, unclear whether this is asthma or COPD exacerbation.  - patient declines DuoNeb and IM steroids in office today. She does not want me to change her current inhaler regimen. Currently on LABA/ICS, anticholinergic and beta agonist. SpO2 97% on RA at rest, no evidence of  respiratory distress. Treating for acute exacerbation with steroid burst and azithromycin. Close follow-up in 2 weeks. - pneumococcal vaccines UTD - Fluticasone-Salmeterol (ADVAIR DISKUS) 250-50 MCG/DOSE AEPB; Inhale 1 puff into the lungs 2 (two) times daily.  Dispense: 60 each; Refill: 6 - ipratropium (ATROVENT HFA) 17 MCG/ACT inhaler; Inhale 2-3 puffs into the lungs every 6 (six) hours as needed for wheezing.  Dispense: 1 Inhaler; Refill: 5 - albuterol (PROVENTIL) (5 MG/ML) 0.5% nebulizer solution; Take 0.5 mLs (2.5 mg total) by nebulization every 4 (four) hours as needed for wheezing or shortness of breath.  Dispense: 20 mL; Refill: 5 - albuterol (VENTOLIN HFA) 108 (90 Base) MCG/ACT inhaler; Inhale 1-2 puffs into the lungs every 4 (four) hours as needed for wheezing or shortness of breath.  Dispense: 2 Inhaler; Refill: 5 - predniSONE (DELTASONE) 20 MG tablet; Take 2 tablets (40 mg total) by mouth daily with breakfast for 5 days.  Dispense: 10 tablet; Refill: 0 - azithromycin (ZITHROMAX Z-PAK) 250 MG tablet; Take 2 tablets (500 mg) on  Day 1,  followed by 1 tablet (250 mg) once daily on Days 2 through 5.  Dispense: 6 tablet; Refill: 0  2. Dyspnea on exertion - does not appear volume overloaded, trace pitting edema, no JVD or crackles, weight is down 3 pounds from last office visit. Orthopnea and decreased  exercise tolerance are concerning for CHF. Ordering CXR, stat BNP and urgent echo - DG Chest 2 View - B Nat Peptide - ECHOCARDIOGRAM COMPLETE; Future - CBC w/Diff/Platelet - predniSONE (DELTASONE) 20 MG tablet; Take 2 tablets (40 mg total) by mouth daily with breakfast for 5 days.  Dispense: 10 tablet; Refill: 0 - azithromycin (ZITHROMAX Z-PAK) 250 MG tablet; Take 2 tablets (500 mg) on  Day 1,  followed by 1 tablet (250 mg) once daily on Days 2 through 5.  Dispense: 6 tablet; Refill: 0  3. Hypertension associated with diabetes (HCC) BP Readings from Last 3 Encounters:  08/02/17 127/73  11/13/16 (!) 150/77  10/04/16 (!) 199/65  - BP at goal <140/90 - continue ACE-thiazide - COMPLETE METABOLIC PANEL WITH GFR  4. DM type 2, uncontrolled, with retinopathy (HCC) Lab Results  Component Value Date   HGBA1C 8.1 08/02/2017  - improved from 10.3, 4 months ago. Still not at goal <7.5. She has been on multiple long-acting and intermediate-actin insulin regimens over the last year. Tolerating Soliqua 38 units daily. She is not self-titrating despite FBG's above 130, unclear why. She is monitoring her glucose tid.  - foot exam performed today and wnl - compliant with ACE and statin - POCT HgB A1C - Ambulatory referral to Ophthalmology for diabetic eye exam with retina specialist - Ambulatory referral to Endocrinology  5. Mild peripheral edema - B Nat Peptide - ECHOCARDIOGRAM COMPLETE; Future  Patient education and anticipatory guidance given Patient agrees with treatment plan Follow-up in 2 weeks for dyspnea or sooner as needed if symptoms worsen or fail to improve  Levonne Hubertharley E. Safwan Tomei PA-C

## 2017-08-03 LAB — CBC WITH DIFFERENTIAL/PLATELET
BASOS ABS: 95 {cells}/uL (ref 0–200)
Basophils Relative: 0.7 %
EOS ABS: 1418 {cells}/uL — AB (ref 15–500)
Eosinophils Relative: 10.5 %
HCT: 39.9 % (ref 35.0–45.0)
HEMOGLOBIN: 13.8 g/dL (ref 11.7–15.5)
Lymphs Abs: 2390 cells/uL (ref 850–3900)
MCH: 29.1 pg (ref 27.0–33.0)
MCHC: 34.6 g/dL (ref 32.0–36.0)
MCV: 84.2 fL (ref 80.0–100.0)
MONOS PCT: 5.7 %
MPV: 10 fL (ref 7.5–12.5)
NEUTROS ABS: 8829 {cells}/uL — AB (ref 1500–7800)
NEUTROS PCT: 65.4 %
Platelets: 377 10*3/uL (ref 140–400)
RBC: 4.74 10*6/uL (ref 3.80–5.10)
RDW: 12.7 % (ref 11.0–15.0)
Total Lymphocyte: 17.7 %
WBC mixed population: 770 cells/uL (ref 200–950)
WBC: 13.5 10*3/uL — ABNORMAL HIGH (ref 3.8–10.8)

## 2017-08-03 LAB — COMPLETE METABOLIC PANEL WITH GFR
AG RATIO: 1.4 (calc) (ref 1.0–2.5)
ALBUMIN MSPROF: 4.2 g/dL (ref 3.6–5.1)
ALKALINE PHOSPHATASE (APISO): 74 U/L (ref 33–130)
ALT: 22 U/L (ref 6–29)
AST: 17 U/L (ref 10–35)
BILIRUBIN TOTAL: 0.2 mg/dL (ref 0.2–1.2)
BUN / CREAT RATIO: 40 (calc) — AB (ref 6–22)
BUN: 35 mg/dL — ABNORMAL HIGH (ref 7–25)
CHLORIDE: 106 mmol/L (ref 98–110)
CO2: 21 mmol/L (ref 20–32)
Calcium: 10.1 mg/dL (ref 8.6–10.4)
Creat: 0.88 mg/dL (ref 0.60–0.93)
GFR, EST AFRICAN AMERICAN: 77 mL/min/{1.73_m2} (ref >=60)
GFR, Est Non African American: 67 mL/min/{1.73_m2} (ref >=60)
GLOBULIN: 3.1 g/dL (ref 1.9–3.7)
Glucose, Bld: 170 mg/dL — ABNORMAL HIGH (ref 65–99)
POTASSIUM: 5.1 mmol/L (ref 3.5–5.3)
SODIUM: 138 mmol/L (ref 135–146)
TOTAL PROTEIN: 7.3 g/dL (ref 6.1–8.1)

## 2017-08-03 LAB — BRAIN NATRIURETIC PEPTIDE

## 2017-08-03 NOTE — Progress Notes (Signed)
Labs show evidence of dehydration and infection I think this is more consistent with asthma/COPD exacerbation than heart failure Chest x-ray also showed some changes consistent with chronic lung disease, but there was no pulmonary edema to suggest heart failure I would like to see her on Friday for follow-up When she is improved, I would like her to have an appointment for spirometry

## 2017-08-09 ENCOUNTER — Ambulatory Visit (HOSPITAL_COMMUNITY): Payer: Medicare Other | Attending: Internal Medicine

## 2017-08-09 ENCOUNTER — Other Ambulatory Visit: Payer: Self-pay

## 2017-08-09 DIAGNOSIS — R0609 Other forms of dyspnea: Secondary | ICD-10-CM | POA: Insufficient documentation

## 2017-08-09 DIAGNOSIS — R600 Localized edema: Secondary | ICD-10-CM

## 2017-08-09 DIAGNOSIS — I340 Nonrheumatic mitral (valve) insufficiency: Secondary | ICD-10-CM | POA: Diagnosis not present

## 2017-08-09 DIAGNOSIS — E785 Hyperlipidemia, unspecified: Secondary | ICD-10-CM | POA: Diagnosis not present

## 2017-08-09 DIAGNOSIS — I119 Hypertensive heart disease without heart failure: Secondary | ICD-10-CM | POA: Diagnosis not present

## 2017-08-09 DIAGNOSIS — E119 Type 2 diabetes mellitus without complications: Secondary | ICD-10-CM | POA: Insufficient documentation

## 2017-08-09 DIAGNOSIS — R609 Edema, unspecified: Secondary | ICD-10-CM | POA: Diagnosis not present

## 2017-08-10 ENCOUNTER — Encounter: Payer: Self-pay | Admitting: Physician Assistant

## 2017-08-10 DIAGNOSIS — I340 Nonrheumatic mitral (valve) insufficiency: Secondary | ICD-10-CM

## 2017-08-10 DIAGNOSIS — Z9289 Personal history of other medical treatment: Secondary | ICD-10-CM | POA: Insufficient documentation

## 2017-08-10 HISTORY — DX: Nonrheumatic mitral (valve) insufficiency: I34.0

## 2017-08-10 NOTE — Progress Notes (Signed)
Echo shows a strong, healthy heart Ejection fraction (heart's pumping ability) is 65-70%, which is optimal There is mild dysfunction in the way the heart muscle relaxes. This is called diastolic dysfunction and is related to high blood pressure There is mild backflow of her mitral valve (mitral regurgitation), but no significant valvular disease Recommend controlling blood pressure to goal of 130/80 Recommend spirometry to test lung function

## 2017-09-08 ENCOUNTER — Other Ambulatory Visit: Payer: Self-pay | Admitting: Physician Assistant

## 2017-09-20 ENCOUNTER — Encounter: Payer: Self-pay | Admitting: Endocrinology

## 2017-09-27 ENCOUNTER — Encounter: Payer: Self-pay | Admitting: Internal Medicine

## 2017-10-12 ENCOUNTER — Telehealth: Payer: Self-pay

## 2017-10-12 NOTE — Telephone Encounter (Signed)
Pt reports that she already using the humalog pen.  She is stated that the insulin pen is not working for her.  She is wanting to go back on regular humalog.  Her last A1C was done around 3/25.  She is not due for a recheck yet.  Please advise. -EH/RMA

## 2017-10-13 NOTE — Telephone Encounter (Signed)
I don't manage her diabetes Patient should contact her endocrinologist

## 2017-10-13 NOTE — Telephone Encounter (Signed)
vm not set up -EH/RMA  

## 2017-10-28 NOTE — Telephone Encounter (Signed)
Pt called requesting med changes for her diabetic medication.  [facility]  Tried returning call to pt no answer and no vm set up. -EH/RMA

## 2017-11-02 ENCOUNTER — Other Ambulatory Visit: Payer: Self-pay | Admitting: Physician Assistant

## 2017-11-02 DIAGNOSIS — I10 Essential (primary) hypertension: Secondary | ICD-10-CM

## 2017-11-06 ENCOUNTER — Other Ambulatory Visit: Payer: Self-pay | Admitting: Physician Assistant

## 2017-11-10 ENCOUNTER — Other Ambulatory Visit: Payer: Self-pay | Admitting: Physician Assistant

## 2017-11-10 ENCOUNTER — Telehealth: Payer: Self-pay

## 2017-11-10 MED ORDER — INSULIN GLARGINE-LIXISENATIDE 100-33 UNT-MCG/ML ~~LOC~~ SOPN: 38.0000 [IU] | PEN_INJECTOR | SUBCUTANEOUS | 0 refills | Status: DC

## 2017-11-10 NOTE — Telephone Encounter (Signed)
Pt notified of recommendations.  She stated that she understood she'll have to be seen q3 months. -EH/RMA

## 2017-11-10 NOTE — Telephone Encounter (Signed)
I have sent in a short supply of her insulin We will not be giving her more than a 30-day supply without follow-up If she does not want to follow-up with Dr. Shawnee KnappLevy, she will need to see me every 3 months in the office for her diabetes and arrange transportation for this

## 2017-11-10 NOTE — Telephone Encounter (Signed)
Patient left another vm this morning requesting a refill of insulin.  I've been trying to contact patient since 10/12/17.  Finally was able to speak with patient.  I explained to her that she needs to call Dr. Lacie DraftLevy's office for more insulin refills and she said " I do not associate myself with that office any longer.  Jamie Strickland is my only doctor. "  I then told patient that if she wanted refills from our office she would have to be seen first. (A1c is due) She said that she do not have any transportation.  I tried to offer her the 4:00 slot to give her time to figure out transportation.  She declined.  She said she will not have time to make any arranges to get here today. She said that after today she will not have any insulin.  She is demanding a 90 day supply of all her medications.  I told patient that I will route a note to provider for further review. -EH/RMA

## 2017-11-11 ENCOUNTER — Other Ambulatory Visit: Payer: Self-pay | Admitting: Physician Assistant

## 2017-11-24 ENCOUNTER — Other Ambulatory Visit: Payer: Self-pay | Admitting: Family Medicine

## 2017-11-25 ENCOUNTER — Ambulatory Visit (INDEPENDENT_AMBULATORY_CARE_PROVIDER_SITE_OTHER): Payer: Medicare Other | Admitting: Physician Assistant

## 2017-11-25 ENCOUNTER — Encounter: Payer: Self-pay | Admitting: Physician Assistant

## 2017-11-25 VITALS — BP 176/78 | HR 93 | Wt 226.0 lb

## 2017-11-25 DIAGNOSIS — E785 Hyperlipidemia, unspecified: Secondary | ICD-10-CM

## 2017-11-25 DIAGNOSIS — Z79899 Other long term (current) drug therapy: Secondary | ICD-10-CM | POA: Diagnosis not present

## 2017-11-25 DIAGNOSIS — I152 Hypertension secondary to endocrine disorders: Secondary | ICD-10-CM

## 2017-11-25 DIAGNOSIS — E1169 Type 2 diabetes mellitus with other specified complication: Secondary | ICD-10-CM | POA: Diagnosis not present

## 2017-11-25 DIAGNOSIS — Z794 Long term (current) use of insulin: Secondary | ICD-10-CM | POA: Diagnosis not present

## 2017-11-25 DIAGNOSIS — H9202 Otalgia, left ear: Secondary | ICD-10-CM

## 2017-11-25 DIAGNOSIS — E1165 Type 2 diabetes mellitus with hyperglycemia: Secondary | ICD-10-CM | POA: Diagnosis not present

## 2017-11-25 DIAGNOSIS — E11319 Type 2 diabetes mellitus with unspecified diabetic retinopathy without macular edema: Secondary | ICD-10-CM | POA: Diagnosis not present

## 2017-11-25 DIAGNOSIS — E1159 Type 2 diabetes mellitus with other circulatory complications: Secondary | ICD-10-CM

## 2017-11-25 DIAGNOSIS — J4530 Mild persistent asthma, uncomplicated: Secondary | ICD-10-CM | POA: Diagnosis not present

## 2017-11-25 DIAGNOSIS — IMO0002 Reserved for concepts with insufficient information to code with codable children: Secondary | ICD-10-CM

## 2017-11-25 DIAGNOSIS — I10 Essential (primary) hypertension: Secondary | ICD-10-CM

## 2017-11-25 LAB — POCT GLYCOSYLATED HEMOGLOBIN (HGB A1C): HbA1c, POC (controlled diabetic range): 8.7 % — AB (ref 0.0–7.0)

## 2017-11-25 NOTE — Progress Notes (Signed)
HPI:                                                                Jamie Strickland is a 72 y.o. female who presents to [name]: Primary Care Sports Medicine today for diabetes follow-up  DMII: poorly controlled Type 2 diabetes with long-term use of insulin. Has been followed by Endocrinology (Dr. Shawnee Knapp), but is requesting for PCP to take over management. Currently taking Tresiba 40U daily, Metformin XR 1000 mg bid, and Humalog 25U before meals. Compliant with medications. Requesting to change Humalog from pen to a vial because she has had difficulty with operating the pen. Denies polydipsia, polyuria, polyphagia. Denies blurred vision or vision change. Denies extremity pain, altered sensation and paresthesias.  Denies ulcers/wounds on feet. Hx of DKA/HHS: Diabetes associated symptoms: retinopathy Blood glucose readings: n/a Hypoglycemia frequency: n/a Severe hypoglycemia (requiring 3rd party assistance): none  Also c/o left ear pain beginning last night, lasted seconds and resolved spontaneously. Denies hearing change, otorrhea, tinnitus.   Depression screen [facility] 2/9 11/13/2016 08/17/2016 08/19/2015  Decreased Interest 0 0 0  Down, Depressed, Hopeless 0 0 0  PHQ - 2 Score 0 0 0    No flowsheet data found.    Past Medical History:  Diagnosis Date  . Allergic rhinitis   . Diabetes mellitus type II   . Hx of colonic polyps   . Hyperlipidemia   . Hypertension   . Mild intermittent allergic asthma without complication 12/28/2007   Qualifier: Diagnosis of  By: Alphonsus Sias MD, Ronnette Hila   . Mild mitral regurgitation 08/10/2017  . Mild nonproliferative diabetic retinopathy (HCC) 09/25/2014   Dxed Dr Blair Promise, 09/11/2014.   Changes noted in bilateral eyes?   . Osteoarthritis    Past Surgical History:  Procedure Laterality Date  . 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 CATARACT EXTRACTION W/ INTRAOCULAR LENS  IMPLANT,  BILATERAL Bilateral 2014  . CATARACT EXTRACTION W/ INTRAOCULAR LENS  IMPLANT, BILATERAL  11/14  . CHOLECYSTECTOMY    . LASIK     Social History   Tobacco Use  . Smoking status: Former Games developer  . Smokeless tobacco: Former Neurosurgeon    Quit date: 08/09/2008  Substance Use Topics  . Alcohol use: Yes    Alcohol/week: 0.0 oz    Comment: Rare   family history includes Asthma (age of onset: 69) in her father; Cancer in her brother and maternal aunt; Diabetes in her father and mother; Heart disease in her brother, father, maternal grandfather, and mother; Liver disease in her father.    ROS: negative except as noted in the HPI  Medications: Current Outpatient Medications  Medication Sig Dispense Refill  . albuterol (PROVENTIL) (5 MG/ML) 0.5% nebulizer solution Take 0.5 mLs (2.5 mg total) by nebulization every 4 (four) hours as needed for wheezing or shortness of breath. 20 mL 5  . albuterol (VENTOLIN HFA) 108 (90 Base) MCG/ACT inhaler Inhale 1-2 puffs into the lungs every 4 (four) hours as needed for wheezing or shortness of breath. 2 Inhaler 5  . aspirin EC 81 MG tablet Take 81 mg by mouth daily.    Marland Kitchen atorvastatin (LIPITOR) 20 MG tablet TAKE 1 TABLET(20 MG) BY MOUTH DAILY 90  tablet 1  . Fluticasone-Salmeterol (ADVAIR DISKUS) 250-50 MCG/DOSE AEPB Inhale 1 puff into the lungs 2 (two) times daily. 60 each 6  . glucose blood test strip Use as instructed 100 each 12  . Insulin Glargine-Lixisenatide 100-33 UNT-MCG/ML SOPN Inject 38 Units into the skin every morning. 3 mL 0  . ipratropium (ATROVENT HFA) 17 MCG/ACT inhaler Inhale 2-3 puffs into the lungs every 6 (six) hours as needed for wheezing. 1 Inhaler 5  . lisinopril-hydrochlorothiazide (PRINZIDE,ZESTORETIC) 20-25 MG tablet TAKE 1 TABLET BY MOUTH EVERY DAY 30 tablet 0  . metFORMIN (GLUCOPHAGE-XR) 500 MG 24 hr tablet Take 2 tablets (1,000 mg total) by mouth 2 (two) times daily with a meal. NEEDS APPOINTMENT 120 tablet 0   No current  facility-administered medications for this visit.    Allergies  Allergen Reactions  . Invokana [Canagliflozin] Nausea And Vomiting and Other (See Comments)    Yeast infections  . Codeine Nausea And Vomiting    REACTION: n /v       Objective:  BP (!) 176/78   Pulse 93   Wt 226 lb (102.5 kg)   BMI 41.34 kg/m  Gen:  alert, not ill-appearing, no distress, appropriate for age, obese female HEENT: head normocephalic without obvious abnormality, conjunctiva and cornea clear, external ear canals normal bilaterally, TM's pearly gray and semi-transparent, trachea midline Pulm: Normal work of breathing, normal phonation, clear to auscultation bilaterally, no wheezes, rales or rhonchi CV: Normal rate, regular rhythm, s1 and s2 distinct, no murmurs, clicks or rubs  Neuro: alert and oriented x 3, no tremor MSK: extremities atraumatic, normal gait and station Skin: intact, no rashes on exposed skin, no jaundice, no cyanosis Psych: well-groomed, cooperative, good eye contact, euthymic mood, affect mood-congruent, speech is articulate, and thought processes clear and goal-directed    Results for orders placed or performed in visit on 11/25/17 (from the past 72 hour(s))  POCT glycosylated hemoglobin (Hb A1C)     Status: Abnormal   Collection Time: 11/25/17  2:05 PM  Result Value Ref Range   Hemoglobin A1C  4.0 - 5.6 %   HbA1c POC (<> result, manual entry)  4.0 - 5.6 %   HbA1c, POC (prediabetic range)  5.7 - 6.4 %   HbA1c, POC (controlled diabetic range) 8.7 (A) 0.0 - 7.0 %   No results found.    Assessment and Plan: 72 y.o. female with   Hyperlipidemia associated with type 2 diabetes mellitus (HCC) - Plan: POCT glycosylated hemoglobin (Hb A1C)  DM type 2, uncontrolled, with retinopathy (HCC)  Hypertension goal BP (blood pressure) < 140/90  Mild persistent asthma with acute exacerbation  Uncontrolled type 2 diabetes mellitus with retinopathy, with long-term current use of insulin  (HCC)  Hyperlipidemia LDL goal <70  HTN BP Readings from Last 3 Encounters:  11/25/17 (!) 176/78  08/02/17 127/73  11/13/16 (!) 150/77  - BP severely out of range in office today. Asymptomatic. Patient states she is in a rush to leave and return the car she borrowed to make this appointment. - goal <140/90 - cont Lisinopril-HCTZ 20-25 - counseled on therapeutic lifestyle changes  Type 2 Diabetes Lab Results  Component Value Date   HGBA1C 8.7 (A) 11/25/2017  - goal A1c <8 - intolerant to SGLT2, consider adding GLP-1 or switching to NigerSoliqua - counseled on sliding scale mealtime insulin - cont Tresiba 40U nightly - cont Metformin XR 1000 QHS - cont ACE - cont statin - overdue for eye exam, referral placed 08/02/17  Otalgia -  no abnormality on exam, symptoms resolved spontaneously - discussed likely causes to include eustachian tube dysfunction, TMJ - follow-up prn  Patient education and anticipatory guidance given Patient agrees with treatment plan Follow-up in 3 months or sooner as needed if symptoms worsen or fail to improve  Levonne Hubert PA-C

## 2017-11-26 ENCOUNTER — Other Ambulatory Visit: Payer: Self-pay | Admitting: Family Medicine

## 2017-11-26 DIAGNOSIS — E785 Hyperlipidemia, unspecified: Secondary | ICD-10-CM

## 2017-11-26 DIAGNOSIS — I10 Essential (primary) hypertension: Secondary | ICD-10-CM

## 2017-11-26 DIAGNOSIS — E11319 Type 2 diabetes mellitus with unspecified diabetic retinopathy without macular edema: Secondary | ICD-10-CM

## 2017-11-26 DIAGNOSIS — IMO0002 Reserved for concepts with insufficient information to code with codable children: Secondary | ICD-10-CM

## 2017-11-26 DIAGNOSIS — E1165 Type 2 diabetes mellitus with hyperglycemia: Principal | ICD-10-CM

## 2017-11-26 DIAGNOSIS — Z794 Long term (current) use of insulin: Principal | ICD-10-CM

## 2017-11-26 MED ORDER — INSULIN LISPRO 100 UNIT/ML ~~LOC~~ SOLN: 25.0000 [IU] | Freq: Three times a day (TID) | SUBCUTANEOUS | 5 refills | Status: DC

## 2017-11-26 MED ORDER — LISINOPRIL-HYDROCHLOROTHIAZIDE 20-25 MG PO TABS: 1.0000 | ORAL_TABLET | Freq: Every day | ORAL | 1 refills | Status: DC

## 2017-11-26 MED ORDER — ATORVASTATIN CALCIUM 20 MG PO TABS: ORAL_TABLET | ORAL | 3 refills | Status: DC

## 2017-11-26 NOTE — Progress Notes (Signed)
Pt called on call nurse. meds haven't been sent for 90 day supply and will be out of humalog tomorrow. Wants vial instead of pen.  New rx sent

## 2017-12-01 ENCOUNTER — Other Ambulatory Visit: Payer: Self-pay | Admitting: Physician Assistant

## 2017-12-10 ENCOUNTER — Encounter: Payer: Self-pay | Admitting: Physician Assistant

## 2017-12-10 DIAGNOSIS — H9202 Otalgia, left ear: Secondary | ICD-10-CM | POA: Insufficient documentation

## 2017-12-10 DIAGNOSIS — J4530 Mild persistent asthma, uncomplicated: Secondary | ICD-10-CM | POA: Insufficient documentation

## 2018-01-07 ENCOUNTER — Other Ambulatory Visit: Payer: Self-pay | Admitting: Family Medicine

## 2018-01-14 ENCOUNTER — Other Ambulatory Visit: Payer: Self-pay | Admitting: Physician Assistant

## 2018-01-26 ENCOUNTER — Other Ambulatory Visit: Payer: Self-pay | Admitting: Physician Assistant

## 2018-01-28 ENCOUNTER — Other Ambulatory Visit: Payer: Self-pay

## 2018-01-28 MED ORDER — "INSULIN SYRINGE 30G X 5/16"" 0.5 ML MISC": 1 refills | Status: DC

## 2018-02-09 ENCOUNTER — Other Ambulatory Visit: Payer: Self-pay | Admitting: Physician Assistant

## 2018-02-10 ENCOUNTER — Other Ambulatory Visit: Payer: Self-pay | Admitting: Physician Assistant

## 2018-02-10 DIAGNOSIS — E11319 Type 2 diabetes mellitus with unspecified diabetic retinopathy without macular edema: Secondary | ICD-10-CM

## 2018-02-10 DIAGNOSIS — E1165 Type 2 diabetes mellitus with hyperglycemia: Principal | ICD-10-CM

## 2018-02-10 DIAGNOSIS — Z794 Long term (current) use of insulin: Principal | ICD-10-CM

## 2018-02-10 DIAGNOSIS — IMO0002 Reserved for concepts with insufficient information to code with codable children: Secondary | ICD-10-CM

## 2018-02-10 MED ORDER — TRESIBA FLEXTOUCH 200 UNIT/ML ~~LOC~~ SOPN: 40.0000 [IU] | PEN_INJECTOR | SUBCUTANEOUS | 1 refills | Status: DC

## 2018-02-13 IMAGING — CR DG CHEST 2V
2 series · 2 of 2 positions shown · non-contrast
Comparison: Chest radiograph June 29, 2005

CLINICAL DATA: Cough, congestion, wheezing. History of
hypertension, hyperlipidemia and diabetes.

EXAM:
CHEST  2 VIEW

[chest pa]
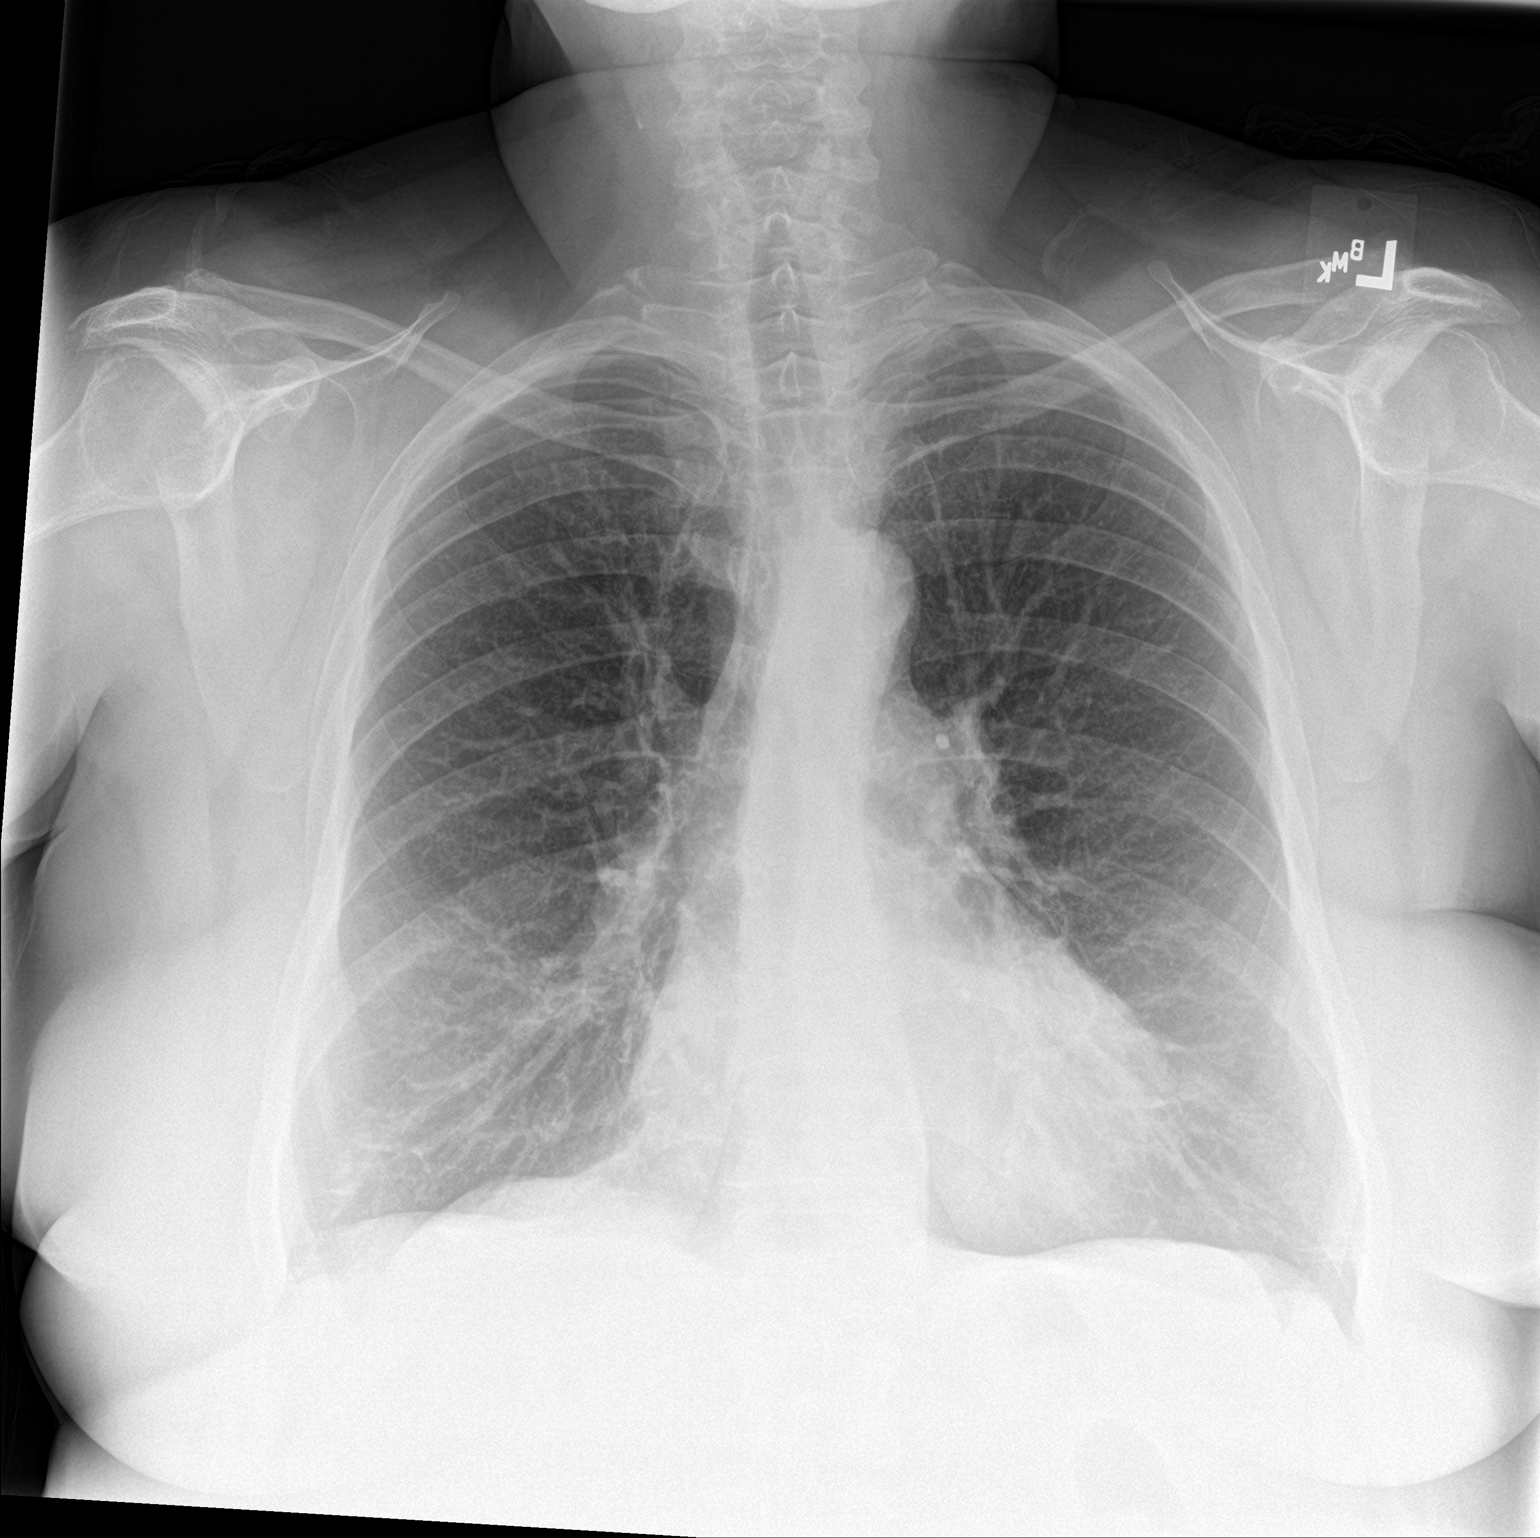

[chest lat]
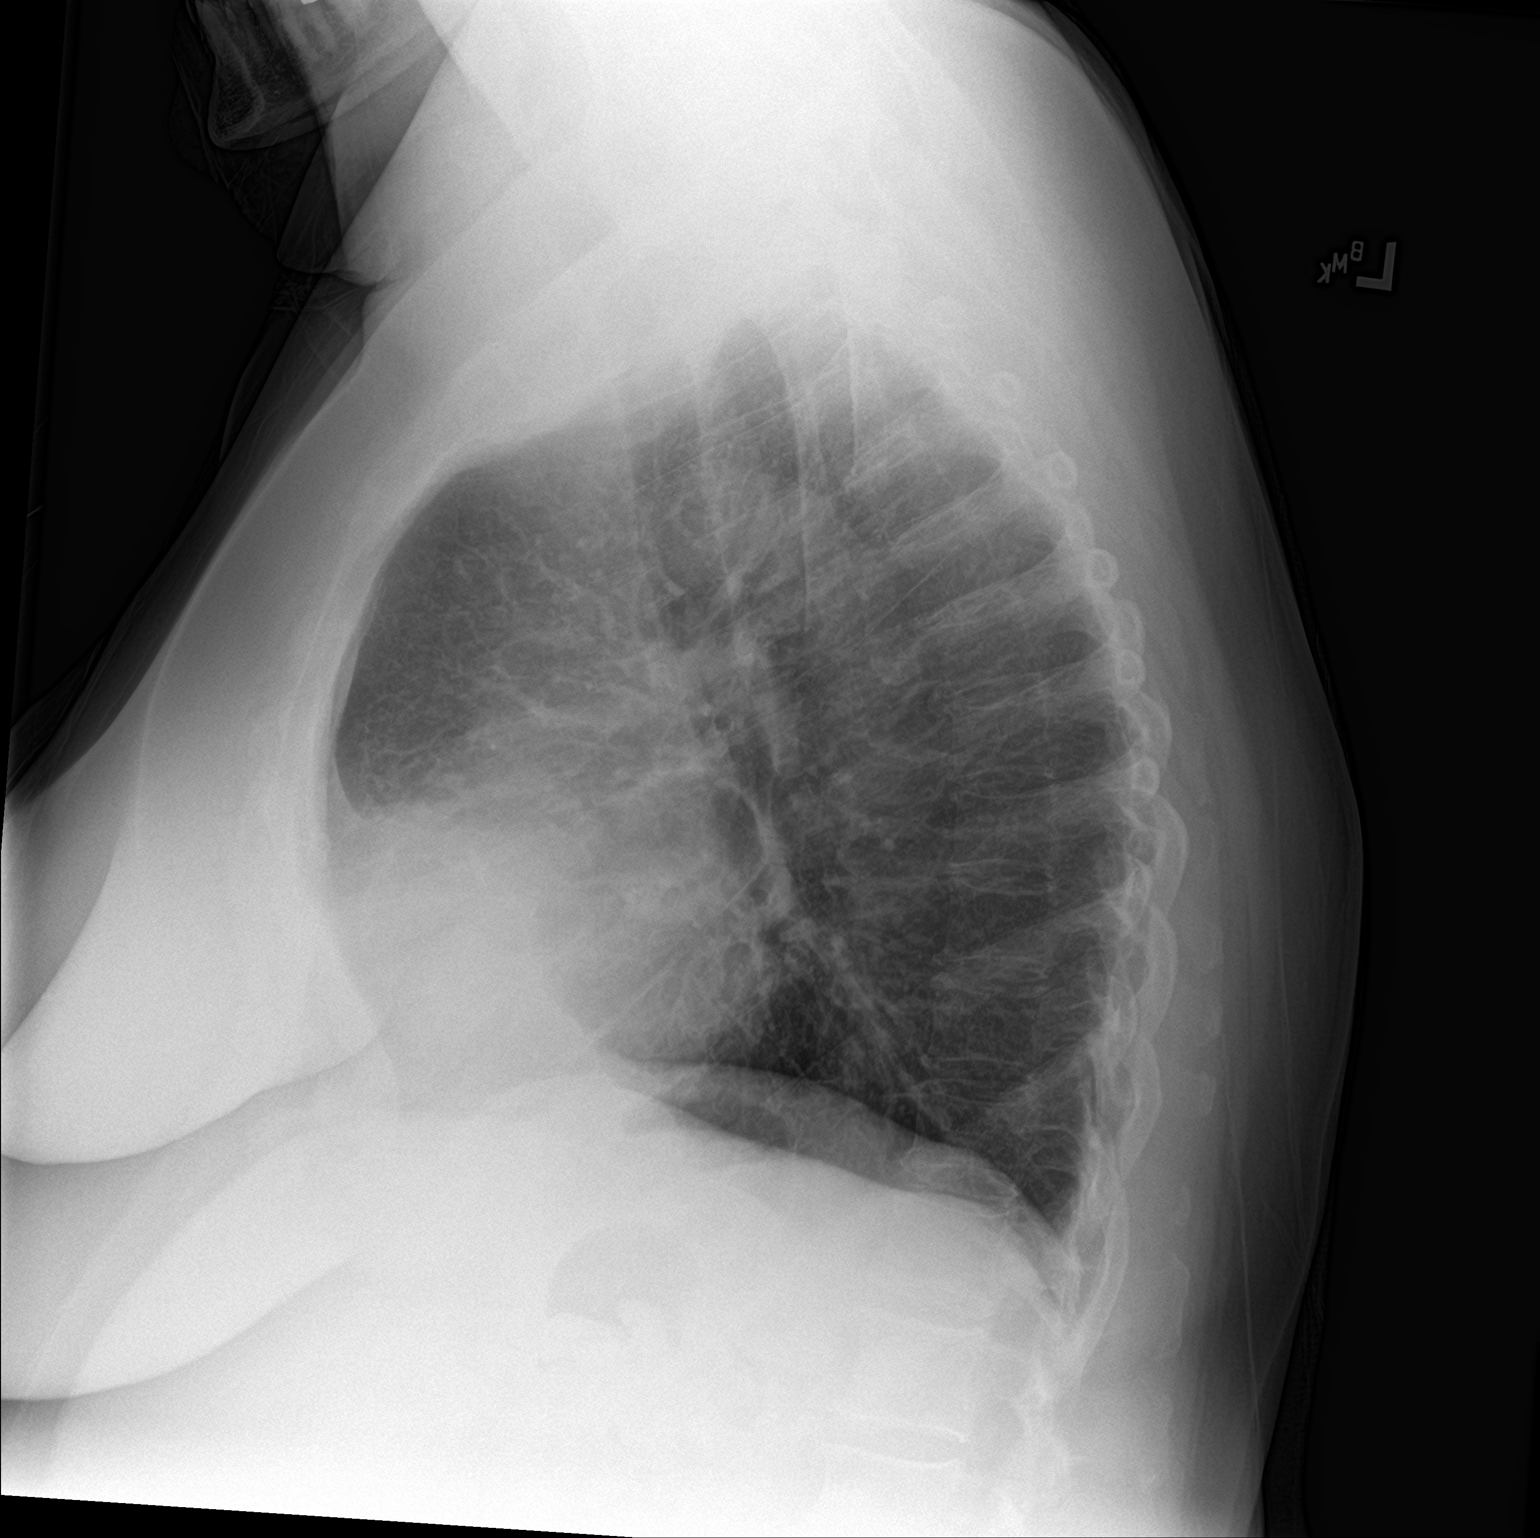

[2 of 2 positions shown; findings below may reference images not displayed]

FINDINGS: Cardiomediastinal silhouette is normal. Mild bronchitic changes
without pleural effusion or focal consolidation. Bibasilar strandy
densities. No pneumothorax. Soft tissue planes and included osseous
structures are nonsuspicious. Trace calcification in RIGHT neck are
likely vascular.
IMPRESSION: Mild bronchitic changes without focal consolidation. Bibasilar
atelectasis.

## 2018-02-14 ENCOUNTER — Telehealth: Payer: Self-pay

## 2018-02-14 NOTE — Telephone Encounter (Signed)
Pt called leaving a message for a refill of her insulin.  Tried to call pt and let her know that [place] sent in tresiba.  No vm set up -EH/RMA

## 2018-02-18 ENCOUNTER — Other Ambulatory Visit: Payer: Self-pay | Admitting: Physician Assistant

## 2018-02-18 DIAGNOSIS — J4531 Mild persistent asthma with (acute) exacerbation: Secondary | ICD-10-CM

## 2018-02-23 ENCOUNTER — Ambulatory Visit (INDEPENDENT_AMBULATORY_CARE_PROVIDER_SITE_OTHER): Payer: Medicare Other | Admitting: Physician Assistant

## 2018-02-23 ENCOUNTER — Ambulatory Visit (INDEPENDENT_AMBULATORY_CARE_PROVIDER_SITE_OTHER): Payer: Medicare Other

## 2018-02-23 ENCOUNTER — Encounter: Payer: Self-pay | Admitting: Physician Assistant

## 2018-02-23 VITALS — BP 195/73 | HR 99 | Wt 227.0 lb

## 2018-02-23 DIAGNOSIS — Z794 Long term (current) use of insulin: Secondary | ICD-10-CM | POA: Diagnosis not present

## 2018-02-23 DIAGNOSIS — E1159 Type 2 diabetes mellitus with other circulatory complications: Secondary | ICD-10-CM | POA: Diagnosis not present

## 2018-02-23 DIAGNOSIS — J3089 Other allergic rhinitis: Secondary | ICD-10-CM

## 2018-02-23 DIAGNOSIS — D72829 Elevated white blood cell count, unspecified: Secondary | ICD-10-CM

## 2018-02-23 DIAGNOSIS — Z23 Encounter for immunization: Secondary | ICD-10-CM

## 2018-02-23 DIAGNOSIS — E1165 Type 2 diabetes mellitus with hyperglycemia: Secondary | ICD-10-CM | POA: Diagnosis not present

## 2018-02-23 DIAGNOSIS — Z1231 Encounter for screening mammogram for malignant neoplasm of breast: Secondary | ICD-10-CM

## 2018-02-23 DIAGNOSIS — Z1389 Encounter for screening for other disorder: Secondary | ICD-10-CM

## 2018-02-23 DIAGNOSIS — I10 Essential (primary) hypertension: Secondary | ICD-10-CM

## 2018-02-23 DIAGNOSIS — IMO0002 Reserved for concepts with insufficient information to code with codable children: Secondary | ICD-10-CM

## 2018-02-23 DIAGNOSIS — E11319 Type 2 diabetes mellitus with unspecified diabetic retinopathy without macular edema: Secondary | ICD-10-CM

## 2018-02-23 LAB — POCT GLYCOSYLATED HEMOGLOBIN (HGB A1C): HBA1C, POC (CONTROLLED DIABETIC RANGE): 9.2 % — AB (ref 0.0–7.0)

## 2018-02-23 MED ORDER — LEVOCETIRIZINE DIHYDROCHLORIDE 5 MG PO TABS: 5.0000 mg | ORAL_TABLET | Freq: Every evening | ORAL | 3 refills | Status: DC

## 2018-02-23 MED ORDER — TETANUS-DIPHTH-ACELL PERTUSSIS 5-2.5-18.5 LF-MCG/0.5 IM SUSP: 0.5000 mL | Freq: Once | INTRAMUSCULAR | 0 refills | Status: AC

## 2018-02-23 MED ORDER — MONTELUKAST SODIUM 10 MG PO TABS: 10.0000 mg | ORAL_TABLET | Freq: Every day | ORAL | 3 refills | Status: DC

## 2018-02-23 MED ORDER — GLUCOSE BLOOD VI STRP: ORAL_STRIP | 3 refills | Status: DC

## 2018-02-23 MED ORDER — AMLODIPINE BESYLATE 10 MG PO TABS: 10.0000 mg | ORAL_TABLET | Freq: Every day | ORAL | 1 refills | Status: DC

## 2018-02-23 MED ORDER — "INSULIN SYRINGE 30G X 5/16"" 0.5 ML MISC": 1 refills | Status: DC

## 2018-02-23 MED ORDER — SEMAGLUTIDE(0.25 OR 0.5MG/DOS) 2 MG/1.5ML ~~LOC~~ SOPN: PEN_INJECTOR | SUBCUTANEOUS | 2 refills | Status: DC

## 2018-02-23 NOTE — Patient Instructions (Signed)
1. Due for mammogram. Go to Imaging Dept to schedule or call [phone]  2. Due for Tdap. Request from your pharmacy  3. Start Ozempic once weekly for diabetes. Follow titration schedule. Closely monitor your glucose, especially before giving Humalog   4. Start Amlodipine 10 mg for your blood pressure. Continue your other medications. Return in 2 weeks for a nurse BP check. Bring your BP log.

## 2018-02-23 NOTE — Progress Notes (Signed)
HPI:                                                                Jamie Strickland is a 72 y.o. female who presents to [facility] Jamie Strickland: Primary Care Sports Medicine today for medication management  HTN: taking Lisinopril-HCTZ daily. Compliant with medications. Checks BP's at home. BP range 186-193/82-96. Denies vision change, headache, chest pain with exertion, orthopnea, lightheadedness, syncope and edema. Risk factors include: DM2, HLD, postmenopausal, family hx, obesity  DMII: taking 40 Units of Tresiba, Humalog 25 units at mealtime, and Metformin. Compliant with medications.  Denies polydipsia, polyuria, polyphagia. Denies blurred vision or vision change. Denies extremity pain, altered sensation and paresthesias.  Denies ulcers/wounds on feet. Hx of DKA/HHS: never Glucometer: checks daily, no blood sugar log today Blood glucose readings: n/a Hypoglycemia frequency: none Severe hypoglycemia (requiring 3rd party assistance): none   Depression screen [facility] 2/9 02/23/2018 11/13/2016 08/17/2016 08/19/2015  Decreased Interest 0 0 0 0  Down, Depressed, Hopeless 0 0 0 0  PHQ - 2 Score 0 0 0 0    No flowsheet data found.    Past Medical History:  Diagnosis Date  . Allergic rhinitis   . Diabetes mellitus type II   . Hx of colonic polyps   . Hyperlipidemia   . Hypertension   . Mild intermittent allergic asthma without complication 12/28/2007   Qualifier: Diagnosis of  By: Alphonsus Sias MD, Ronnette Hila   . Mild mitral regurgitation 08/10/2017  . Mild nonproliferative diabetic retinopathy (HCC) 09/25/2014   Dxed Dr Blair Promise, 09/11/2014.   Changes noted in bilateral eyes?   . Osteoarthritis    Past Surgical History:  Procedure Laterality Date  . 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 CATARACT EXTRACTION W/ INTRAOCULAR LENS  IMPLANT, BILATERAL Bilateral 2014  . CATARACT EXTRACTION W/ INTRAOCULAR LENS  IMPLANT, BILATERAL  11/14  .  CHOLECYSTECTOMY    . LASIK     Social History   Tobacco Use  . Smoking status: Former Games developer  . Smokeless tobacco: Former Neurosurgeon    Quit date: 08/09/2008  Substance Use Topics  . Alcohol use: Yes    Alcohol/week: 0.0 standard drinks    Comment: Rare   family history includes Asthma (age of onset: 48) in her father; Cancer in her brother and maternal aunt; Diabetes in her father and mother; Heart disease in her brother, father, maternal grandfather, and mother; Liver disease in her father.    ROS: negative except as noted in the HPI  Medications: Current Outpatient Medications  Medication Sig Dispense Refill  . ACCU-CHEK SMARTVIEW test strip USE AS INSTRUCTED 100 each 3  . albuterol (PROVENTIL) (5 MG/ML) 0.5% nebulizer solution Take 0.5 mLs (2.5 mg total) by nebulization every 4 (four) hours as needed for wheezing or shortness of breath. 20 mL 5  . aspirin EC 81 MG tablet Take 81 mg by mouth daily.    Marland Kitchen atorvastatin (LIPITOR) 20 MG tablet TAKE 1 TABLET(20 MG) BY MOUTH DAILY 90 tablet 3  . BD PEN NEEDLE NANO U/F 32G X 4 MM MISC USE DAILY 100 each 1  . Fluticasone-Salmeterol (ADVAIR DISKUS) 250-50 MCG/DOSE AEPB Inhale 1 puff into the lungs 2 (two) times daily.  60 each 6  . insulin lispro (HUMALOG) 100 UNIT/ML injection Inject 0.25 mLs (25 Units total) into the skin 3 (three) times daily before meals. 30 mL 5  . Insulin Syringe-Needle U-100 (INSULIN SYRINGE .5CC/30GX5/16") 30G X 5/16" 0.5 ML MISC Use as Directed 100 each 1  . ipratropium (ATROVENT HFA) 17 MCG/ACT inhaler Inhale 2-3 puffs into the lungs every 6 (six) hours as needed for wheezing. 1 Inhaler 5  . lisinopril-hydrochlorothiazide (PRINZIDE,ZESTORETIC) 20-25 MG tablet Take 1 tablet by mouth daily. 90 tablet 1  . metFORMIN (GLUCOPHAGE-XR) 500 MG 24 hr tablet Take 2 tablets (1,000 mg total) by mouth 2 (two) times daily with a meal. 360 tablet 1  . PROAIR HFA 108 (90 Base) MCG/ACT inhaler INHALE 1-2 PUFFS INTO THE LUNGS EVERY 4  (FOUR) HOURS AS NEEDED FOR WHEEZING OR SHORTNESS OF BREATH 8.5 Inhaler 5  . TRESIBA FLEXTOUCH 200 UNIT/ML SOPN Inject 40 Units into the skin every morning. 9 mL 1   No current facility-administered medications for this visit.    Allergies  Allergen Reactions  . Invokana [Canagliflozin] Nausea And Vomiting and Other (See Comments)    Yeast infections  . Codeine Nausea And Vomiting    REACTION: n /v       Objective:  BP (!) 195/73   Pulse 99   Wt 227 lb (103 kg)   BMI 41.52 kg/m  Gen:  alert, not ill-appearing, no distress, appropriate for age, obese female HEENT: head normocephalic without obvious abnormality, conjunctiva and cornea clear, trachea midline Pulm: Normal work of breathing, normal phonation, clear to auscultation bilaterally, no wheezes, rales or rhonchi CV: Normal rate, regular rhythm, s1 and s2 distinct, no murmurs, clicks or rubs  Neuro: alert and oriented x 3, no tremor MSK: extremities atraumatic, normal gait and station, no peripheral edema Skin: intact, no rashes on exposed skin, no jaundice, no cyanosis Psych: well-groomed, cooperative, good eye contact, euthymic mood, affect mood-congruent, speech is articulate, and thought processes clear and goal-directed   Lab Results  Component Value Date   HGBA1C 9.2 (A) 02/23/2018     Assessment and Plan: 72 y.o. female with   .Diagnoses and all orders for this visit:  Uncontrolled type 2 diabetes mellitus with retinopathy, with long-term current use of insulin (HCC) -     POCT HgB A1C -     Semaglutide,0.25 or 0.5MG /DOS, (OZEMPIC, 0.25 OR 0.5 MG/DOSE,) 2 MG/1.5ML SOPN; Inject 0.25 mg into the skin once a week for 28 days, THEN 0.5 mg once a week for 28 days. 0.25 mg SQ once weekly for 4 weeks then increase to 0.5 mg once weekly. -     glucose blood (ACCU-CHEK SMARTVIEW) test strip; USE AS INSTRUCTED -     Insulin Syringe-Needle U-100 (INSULIN SYRINGE .5CC/30GX5/16") 30G X 5/16" 0.5 ML MISC; Use as Directed -      Renal function panel -     Urinalysis, microscopic only -     Urine Microalbumin w/creat. ratio  Hypertension associated with type 2 diabetes mellitus (HCC) -     amLODipine (NORVASC) 10 MG tablet; Take 1 tablet (10 mg total) by mouth daily.  Non-seasonal allergic rhinitis, unspecified trigger -     montelukast (SINGULAIR) 10 MG tablet; Take 1 tablet (10 mg total) by mouth at bedtime. -     levocetirizine (XYZAL ALLERGY 24HR) 5 MG tablet; Take 1 tablet (5 mg total) by mouth every evening.  Screening for blood or protein in urine -     Urinalysis,  microscopic only -     Urine Microalbumin w/creat. ratio  Leukocytosis, unspecified type -     CBC  Need for diphtheria-tetanus-pertussis (Tdap) vaccine -     Tdap (BOOSTRIX) 5-2.5-18.5 LF-MCG/0.5 injection; Inject 0.5 mLs into the muscle once for 1 dose.   Patient instructions: 1. Due for mammogram. Go to Imaging Dept to schedule or call [phone]  2. Due for Tdap. Request from your pharmacy. Declines influenza   3. Start Ozempic once weekly for diabetes. Follow titration schedule. Closely monitor your glucose, especially before giving Humalog   4. Start Amlodipine 10 mg for your blood pressure. Continue your other medications. Return in 2 weeks for a nurse BP check. Bring your BP log.    Patient education and anticipatory guidance given Patient agrees with treatment plan Follow-up 2 weeks for nurse BP check, 3 months for A1C/med mgmt or sooner as needed if symptoms worsen or fail to improve  Levonne Hubert PA-C

## 2018-02-24 LAB — RENAL FUNCTION PANEL
ALBUMIN MSPROF: 4.4 g/dL (ref 3.6–5.1)
BUN: 19 mg/dL (ref 7–25)
CALCIUM: 10 mg/dL (ref 8.6–10.4)
CHLORIDE: 99 mmol/L (ref 98–110)
CO2: 22 mmol/L (ref 20–32)
Creat: 0.8 mg/dL (ref 0.60–0.93)
GLUCOSE: 345 mg/dL — AB (ref 65–99)
PHOSPHORUS: 3.8 mg/dL (ref 2.1–4.3)
Potassium: 4.8 mmol/L (ref 3.5–5.3)
Sodium: 136 mmol/L (ref 135–146)

## 2018-02-24 LAB — URINALYSIS, MICROSCOPIC ONLY
Bacteria, UA: NONE SEEN /HPF
HYALINE CAST: NONE SEEN /LPF
SQUAMOUS EPITHELIAL / LPF: NONE SEEN /HPF (ref <=5)

## 2018-02-24 LAB — MICROALBUMIN / CREATININE URINE RATIO
Creatinine, Urine: 63 mg/dL (ref 20–275)
Microalb Creat Ratio: 60 mcg/mg creat — ABNORMAL HIGH (ref <30)
Microalb, Ur: 3.8 mg/dL

## 2018-02-24 LAB — CBC
HCT: 42.5 % (ref 35.0–45.0)
HEMOGLOBIN: 13.9 g/dL (ref 11.7–15.5)
MCH: 28.7 pg (ref 27.0–33.0)
MCHC: 32.7 g/dL (ref 32.0–36.0)
MCV: 87.8 fL (ref 80.0–100.0)
MPV: 11.1 fL (ref 7.5–12.5)
PLATELETS: 254 10*3/uL (ref 140–400)
RBC: 4.84 10*6/uL (ref 3.80–5.10)
RDW: 13.3 % (ref 11.0–15.0)
WBC: 9.8 10*3/uL (ref 3.8–10.8)

## 2018-03-07 DIAGNOSIS — Z23 Encounter for immunization: Secondary | ICD-10-CM | POA: Insufficient documentation

## 2018-03-18 ENCOUNTER — Other Ambulatory Visit: Payer: Self-pay | Admitting: Physician Assistant

## 2018-03-18 DIAGNOSIS — E1165 Type 2 diabetes mellitus with hyperglycemia: Principal | ICD-10-CM

## 2018-03-18 DIAGNOSIS — IMO0002 Reserved for concepts with insufficient information to code with codable children: Secondary | ICD-10-CM

## 2018-03-18 DIAGNOSIS — Z794 Long term (current) use of insulin: Principal | ICD-10-CM

## 2018-03-18 DIAGNOSIS — E11319 Type 2 diabetes mellitus with unspecified diabetic retinopathy without macular edema: Secondary | ICD-10-CM

## 2018-03-19 ENCOUNTER — Other Ambulatory Visit: Payer: Self-pay | Admitting: Physician Assistant

## 2018-03-19 DIAGNOSIS — J4531 Mild persistent asthma with (acute) exacerbation: Secondary | ICD-10-CM

## 2018-04-26 ENCOUNTER — Other Ambulatory Visit: Payer: Self-pay | Admitting: Physician Assistant

## 2018-04-26 DIAGNOSIS — IMO0002 Reserved for concepts with insufficient information to code with codable children: Secondary | ICD-10-CM

## 2018-04-26 DIAGNOSIS — E1165 Type 2 diabetes mellitus with hyperglycemia: Principal | ICD-10-CM

## 2018-04-26 DIAGNOSIS — E11319 Type 2 diabetes mellitus with unspecified diabetic retinopathy without macular edema: Secondary | ICD-10-CM

## 2018-04-26 DIAGNOSIS — Z794 Long term (current) use of insulin: Principal | ICD-10-CM

## 2018-05-02 ENCOUNTER — Other Ambulatory Visit: Payer: Self-pay | Admitting: Physician Assistant

## 2018-05-02 DIAGNOSIS — Z794 Long term (current) use of insulin: Principal | ICD-10-CM

## 2018-05-02 DIAGNOSIS — E11319 Type 2 diabetes mellitus with unspecified diabetic retinopathy without macular edema: Secondary | ICD-10-CM

## 2018-05-02 DIAGNOSIS — IMO0002 Reserved for concepts with insufficient information to code with codable children: Secondary | ICD-10-CM

## 2018-05-02 DIAGNOSIS — E1165 Type 2 diabetes mellitus with hyperglycemia: Principal | ICD-10-CM

## 2018-05-19 ENCOUNTER — Other Ambulatory Visit: Payer: Self-pay | Admitting: Family Medicine

## 2018-05-19 DIAGNOSIS — I10 Essential (primary) hypertension: Secondary | ICD-10-CM

## 2018-05-19 NOTE — Telephone Encounter (Signed)
Will fwd to pcp for signature. Pt has a f/u appointment on 05/25/2018. Wasn't sure about refilling this since she did not f/u for recheck of BP as requested by pcp.Laureen Ochs, [name] Shove, CMA

## 2018-05-25 ENCOUNTER — Encounter: Payer: Self-pay | Admitting: Physician Assistant

## 2018-05-25 ENCOUNTER — Ambulatory Visit (INDEPENDENT_AMBULATORY_CARE_PROVIDER_SITE_OTHER): Payer: Medicare Other | Admitting: Physician Assistant

## 2018-05-25 VITALS — BP 153/84 | HR 92 | Wt 216.0 lb

## 2018-05-25 DIAGNOSIS — I10 Essential (primary) hypertension: Secondary | ICD-10-CM

## 2018-05-25 DIAGNOSIS — E11319 Type 2 diabetes mellitus with unspecified diabetic retinopathy without macular edema: Secondary | ICD-10-CM

## 2018-05-25 DIAGNOSIS — E1159 Type 2 diabetes mellitus with other circulatory complications: Secondary | ICD-10-CM | POA: Diagnosis not present

## 2018-05-25 DIAGNOSIS — J019 Acute sinusitis, unspecified: Secondary | ICD-10-CM | POA: Diagnosis not present

## 2018-05-25 DIAGNOSIS — R6889 Other general symptoms and signs: Secondary | ICD-10-CM | POA: Diagnosis not present

## 2018-05-25 DIAGNOSIS — IMO0002 Reserved for concepts with insufficient information to code with codable children: Secondary | ICD-10-CM

## 2018-05-25 DIAGNOSIS — H65492 Other chronic nonsuppurative otitis media, left ear: Secondary | ICD-10-CM | POA: Diagnosis not present

## 2018-05-25 DIAGNOSIS — E1165 Type 2 diabetes mellitus with hyperglycemia: Secondary | ICD-10-CM

## 2018-05-25 DIAGNOSIS — Z794 Long term (current) use of insulin: Secondary | ICD-10-CM

## 2018-05-25 LAB — POCT GLYCOSYLATED HEMOGLOBIN (HGB A1C): HBA1C, POC (CONTROLLED DIABETIC RANGE): 8.5 % — AB (ref 0.0–7.0)

## 2018-05-25 MED ORDER — CARVEDILOL 6.25 MG PO TABS: 6.2500 mg | ORAL_TABLET | Freq: Two times a day (BID) | ORAL | 3 refills | Status: DC

## 2018-05-25 MED ORDER — PREDNISONE 20 MG PO TABS: 40.0000 mg | ORAL_TABLET | Freq: Every day | ORAL | 0 refills | Status: AC

## 2018-05-25 MED ORDER — INSULIN PEN NEEDLE 32G X 4 MM MISC: 1 refills | Status: DC

## 2018-05-25 MED ORDER — SEMAGLUTIDE (1 MG/DOSE) 2 MG/1.5ML ~~LOC~~ SOPN: 1.0000 mg | PEN_INJECTOR | SUBCUTANEOUS | 3 refills | Status: DC

## 2018-05-25 MED ORDER — AMOXICILLIN-POT CLAVULANATE 875-125 MG PO TABS: 1.0000 | ORAL_TABLET | Freq: Two times a day (BID) | ORAL | 0 refills | Status: DC

## 2018-05-25 NOTE — Patient Instructions (Addendum)
Diabetes Preventive Care: - annual foot exam  - annual dilated eye exam with an eye doctor - self foot exams at least weekly - pneumonia vaccine once (booster in 5 years and at age 73) - annual influenza vaccine - twice yearly dental cleanings and yearly exam - goal blood pressure <140/90, ideally <130/80 - LDL cholesterol <70 - A1C <7.2 - body mass index (BMI) <25.0 - follow-up every 3 months if your A1C is not at goal  For your blood pressure: - Goal <140/90  - take baby aspirin 81 mg daily to help prevent heart attack/stroke - monitor and log blood pressures at home - check around the same time each day in a relaxed setting - Limit salt to <2500 mg/day - Follow DASH (Dietary Approach to Stopping Hypertension) eating plan - Try to get at least 150 minutes of aerobic exercise per week - Aim to go on a brisk walk 30 minutes per day at least 5 days per week. If you're not active, gradually increase how long you walk by 5 minutes each week - limit alcohol: 2 standard drinks per day for men and 1 per day for women - avoid tobacco/nicotine products. Consider smoking cessation if you smoke - weight loss: 7% of current body weight can reduce your blood pressure by 5-10 points - follow-up at least every 6 months for your blood pressure. Follow-up sooner if your BP is not controlled

## 2018-05-25 NOTE — Progress Notes (Signed)
HPI:                                                                Jamie Strickland is a 73 y.o. female who [facility] [place]: Primary Care Sports Medicine today for medication management  HTN: taking Lisinopril-HCTZ and was started on Amlodipine 10 mg 3 months ago. Compliant with medications. Checks BP's at home. BP is still mostly out of range SBP 150-179/67-101, HR 75-94. Denies vision change, headache, chest pain with exertion, orthopnea, lightheadedness, syncope and edema. Risk factors include: DM2, HLD, postmenopausal, family hx, obesity  DMII: uncontrolled, last A1C 9.2. taking 40 Units of Tresiba, Humalog 25 units at mealtime, and Metformin XR. She was started on Ozempic 3 months ago and is currently on 0.5 mg weekly. Compliant with medications.  Denies polydipsia, polyuria, polyphagia. Denies blurred vision or vision change. Denies extremity pain, altered sensation and paresthesias.  Denies ulcers/wounds on feet. Hx of DKA/HHS: never Glucometer: checks daily, no blood sugar log today Blood glucose readings: n/a Hypoglycemia frequency: none Severe hypoglycemia (requiring 3rd party assistance): none  Also c/o intermittent left ear pain, nasal congestion, sinus pressure,  PND and epistaxis. Bleeding is controlled with pressure after a few minutes. Occasionally coughs up gray mucus. States this is affecting her sleep because she is mouth breathing. She is taking Singulair, Xyzal and Flonase without relief. She reports she has had issues with her sinuses in the past and had irrigations. Denies fever, chills, malaise, purulent nasal discharge.  [facility] 2/9 05/25/2018 02/23/2018 11/13/2016 08/17/2016 08/19/2015  Decreased Interest 0 0 0 0 0  Down, Depressed, Hopeless 0 0 0 0 0  PHQ - 2 Score 0 0 0 0 0  Altered sleeping 2 - - - -  Tired, decreased energy 0 - - - -  Change in appetite 0 - - - -  Feeling bad or failure about yourself  0 - - - -  Trouble  concentrating 0 - - - -  Moving slowly or fidgety/restless 0 - - - -  Suicidal thoughts 0 - - - -  PHQ-9 Score 2 - - - -    Fall Risk  05/25/2018 02/23/2018 08/17/2016 08/19/2015  Falls in the past year? 0 No No No  Number falls in past yr: 0 - - -      Past Medical History:  Diagnosis Date  . Allergic rhinitis   . Diabetes mellitus type II   . Hx of colonic polyps   . Hyperlipidemia   . Hypertension   . Mild intermittent allergic asthma without complication 12/28/2007   Qualifier: Diagnosis of  By: Alphonsus SiasLetvak MD, Ronnette Hilaichard Ira   . Mild mitral regurgitation 08/10/2017  . Mild nonproliferative diabetic retinopathy (HCC) 09/25/2014   Dxed Dr Blair PromiseNeill Bulakowski, 09/11/2014.   Changes noted in bilateral eyes?   . Osteoarthritis    Past Surgical History:  Procedure Laterality Date  . 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 CATARACT EXTRACTION W/ INTRAOCULAR LENS  IMPLANT, BILATERAL Bilateral 2014  . CATARACT EXTRACTION W/ INTRAOCULAR LENS  IMPLANT, BILATERAL  11/14  . CHOLECYSTECTOMY    . LASIK     Social History   Tobacco Use  . Smoking  status: Former Games developer  . Smokeless tobacco: Former Neurosurgeon    Quit date: 08/09/2008  Substance Use Topics  . Alcohol use: Yes    Alcohol/week: 0.0 standard drinks    Comment: Rare   family history includes Asthma (age of onset: 49) in her father; Cancer in her brother and maternal aunt; Diabetes in her father and mother; Heart disease in her brother, father, maternal grandfather, and mother; Liver disease in her father.    ROS: negative except as noted in the HPI  Medications: Current Outpatient Medications  Medication Sig Dispense Refill  . albuterol (PROVENTIL) (5 MG/ML) 0.5% nebulizer solution Take 0.5 mLs (2.5 mg total) by nebulization every 4 (four) hours as needed for wheezing or shortness of breath. 20 mL 5  . amLODipine (NORVASC) 10 MG tablet Take 1 tablet (10 mg total) by mouth daily. 90 tablet 1  .  aspirin EC 81 MG tablet Take 81 mg by mouth daily.    Marland Kitchen atorvastatin (LIPITOR) 20 MG tablet TAKE 1 TABLET(20 MG) BY MOUTH DAILY 90 tablet 3  . ATROVENT HFA 17 MCG/ACT inhaler INHALE 2-3 PUFFS INTO THE LUNGS EVERY 6 (SIX) HOURS AS NEEDED FOR WHEEZING. 12.9 Inhaler 5  . BD PEN NEEDLE NANO U/F 32G X 4 MM MISC USE DAILY 100 each 1  . Fluticasone-Salmeterol (ADVAIR DISKUS) 250-50 MCG/DOSE AEPB Inhale 1 puff into the lungs 2 (two) times daily. 60 each 6  . glucose blood (ACCU-CHEK SMARTVIEW) test strip USE AS INSTRUCTED 100 each 3  . insulin lispro (HUMALOG) 100 UNIT/ML injection Inject 0.25 mLs (25 Units total) into the skin 3 (three) times daily before meals. 30 mL 5  . Insulin Syringe-Needle U-100 (INSULIN SYRINGE .5CC/30GX5/16") 30G X 5/16" 0.5 ML MISC USE AS DIRECTED 100 each 1  . levocetirizine (XYZAL ALLERGY 24HR) 5 MG tablet Take 1 tablet (5 mg total) by mouth every evening. 90 tablet 3  . lisinopril-hydrochlorothiazide (PRINZIDE,ZESTORETIC) 20-25 MG tablet TAKE 1 TABLET BY MOUTH EVERY DAY 30 tablet 0  . metFORMIN (GLUCOPHAGE-XR) 500 MG 24 hr tablet Take 2 tablets (1,000 mg total) by mouth 2 (two) times daily with a meal. 360 tablet 1  . montelukast (SINGULAIR) 10 MG tablet Take 1 tablet (10 mg total) by mouth at bedtime. 90 tablet 3  . PROAIR HFA 108 (90 Base) MCG/ACT inhaler INHALE 1-2 PUFFS INTO THE LUNGS EVERY 4 (FOUR) HOURS AS NEEDED FOR WHEEZING OR SHORTNESS OF BREATH 8.5 Inhaler 5  . TRESIBA FLEXTOUCH 200 UNIT/ML SOPN INJECT 40 UNITS INTO THE SKIN EVERY MORNING. 9 pen 1  . amoxicillin-clavulanate (AUGMENTIN) 875-125 MG tablet Take 1 tablet by mouth 2 (two) times daily. 20 tablet 0  . carvedilol (COREG) 6.25 MG tablet Take 1 tablet (6.25 mg total) by mouth 2 (two) times daily with a meal. 60 tablet 3  . Insulin Pen Needle (NOVOFINE PLUS) 32G X 4 MM MISC For weekly injection with Ozempic 30 each 1  . predniSONE (DELTASONE) 20 MG tablet Take 2 tablets (40 mg total) by mouth daily with  breakfast for 5 days. 10 tablet 0  . Semaglutide, 1 MG/DOSE, (OZEMPIC, 1 MG/DOSE,) 2 MG/1.5ML SOPN Inject 1 mg into the skin once a week. 1 pen 3   No current facility-administered medications for this visit.    Allergies  Allergen Reactions  . Invokana [Canagliflozin] Nausea And Vomiting and Other (See Comments)    Yeast infections  . Codeine Nausea And Vomiting    REACTION: n /v  Objective:  BP (!) 153/84   Pulse 92   Wt 216 lb (98 kg)   BMI 39.51 kg/m  Gen:  alert, not ill-appearing, no distress, appropriate for age, obese female HEENT: head normocephalic without obvious abnormality, conjunctiva and cornea clear, nasal mucosa edematous, dried blood inside nasal cavity, no frontal or maxillary sinus tenderness, left TM is dull with serous effusion, hearing intact to finger rub, neck supple, oropharynx clear, no cervical adenopathy, trachea midline Pulm: Normal work of breathing, normal phonation, clear to auscultation bilaterally, no wheezes, rales or rhonchi CV: Normal rate, regular rhythm, s1 and s2 distinct, no murmurs, clicks or rubs  Neuro: alert and oriented x 3, no tremor MSK: extremities atraumatic, normal gait and station, no peripheral edema Skin: intact, no rashes on exposed skin, no jaundice, no cyanosis Psych: appearance casual, cooperative, good eye contact, euthymic mood, affect mood-congruent, speech is articulate, and thought processes clear and goal-directed  Lab Results  Component Value Date   CREATININE 0.80 02/23/2018   BUN 19 02/23/2018   NA 136 02/23/2018   K 4.8 02/23/2018   CL 99 02/23/2018   CO2 22 02/23/2018   Lab Results  Component Value Date   CHOL 147 04/27/2016   HDL 38.40 (L) 04/27/2016   LDLCALC 72 04/27/2016   LDLDIRECT 58.4 10/17/2012   TRIG 185.0 (H) 04/27/2016   CHOLHDL 4 04/27/2016     Lab Results  Component Value Date   HGBA1C 9.2 (A) 02/23/2018   The 10-year ASCVD risk score Denman George(Goff DC Jr., et al., 2013) is: 30.2%    Values used to calculate the score:     Age: 89 years     Sex: Female     Is Non-Hispanic African American: No     Diabetic: Yes     Tobacco smoker: No     Systolic Blood Pressure: 153 mmHg     Is BP treated: Yes     HDL Cholesterol: 38.4 mg/dL     Total Cholesterol: 140 mg/dL   Assessment and Plan: 73 y.o. female with   .Jamie Strickland was seen today for hyperglycemia.  Diagnoses and all orders for this visit:  Uncontrolled type 2 diabetes mellitus with retinopathy, with long-term current use of insulin (HCC) -     POCT HgB A1C -     Semaglutide, 1 MG/DOSE, (OZEMPIC, 1 MG/DOSE,) 2 MG/1.5ML SOPN; Inject 1 mg into the skin once a week. -     Insulin Pen Needle (NOVOFINE PLUS) 32G X 4 MM MISC; For weekly injection with Ozempic  Hypertension associated with type 2 diabetes mellitus (HCC) -     carvedilol (COREG) 6.25 MG tablet; Take 1 tablet (6.25 mg total) by mouth 2 (two) times daily with a meal.  Subacute sinusitis, unspecified location -     predniSONE (DELTASONE) 20 MG tablet; Take 2 tablets (40 mg total) by mouth daily with breakfast for 5 days. -     amoxicillin-clavulanate (AUGMENTIN) 875-125 MG tablet; Take 1 tablet by mouth 2 (two) times daily. -     Ambulatory referral to ENT  Chronic middle ear effusion, left -     Ambulatory referral to ENT   Type 2 DM A1c improved .7 points today Increasing Ozempic to 1 mg weekly Cont other medications She has an appt with Ophth this week. She will have report faxed Foot exam UTD Immunizations UTD LDL at goal <70, cont statin  Uncontrolled HTN Adding Carvedilol 6.25 bid, cont max dose Lisinopril-HCTZ and Amlodipine Baby asa for  primary prevention  Subacute sinusitis Treating empirically with Augmentin and prednisone burst Patient advised this will cause temporary increase in BS and BP Referring to ENT for chronic effusion of left ear  Patient education and anticipatory guidance given Patient agrees with treatment  plan Follow-up 2 weeks for nurse BP check, 3 months for A1C/med mgmt or sooner as needed if symptoms worsen or fail to improve  Levonne Hubert PA-C

## 2018-05-28 ENCOUNTER — Other Ambulatory Visit: Payer: Self-pay | Admitting: Physician Assistant

## 2018-05-28 DIAGNOSIS — I10 Essential (primary) hypertension: Principal | ICD-10-CM

## 2018-05-28 DIAGNOSIS — E1159 Type 2 diabetes mellitus with other circulatory complications: Secondary | ICD-10-CM

## 2018-06-04 ENCOUNTER — Other Ambulatory Visit: Payer: Self-pay | Admitting: Physician Assistant

## 2018-06-04 DIAGNOSIS — J4531 Mild persistent asthma with (acute) exacerbation: Secondary | ICD-10-CM

## 2018-06-27 ENCOUNTER — Other Ambulatory Visit: Payer: Self-pay | Admitting: Physician Assistant

## 2018-06-27 DIAGNOSIS — E1165 Type 2 diabetes mellitus with hyperglycemia: Principal | ICD-10-CM

## 2018-06-27 DIAGNOSIS — IMO0002 Reserved for concepts with insufficient information to code with codable children: Secondary | ICD-10-CM

## 2018-06-27 DIAGNOSIS — E11319 Type 2 diabetes mellitus with unspecified diabetic retinopathy without macular edema: Secondary | ICD-10-CM

## 2018-06-27 DIAGNOSIS — Z794 Long term (current) use of insulin: Principal | ICD-10-CM

## 2018-07-07 ENCOUNTER — Other Ambulatory Visit: Payer: Self-pay

## 2018-07-07 NOTE — Patient Outreach (Signed)
[facility] Customer service manager [facility]) Care Management  07/07/2018  Jamie Strickland 10/13/1945 [id number]   Medication Adherence call to [name] patient did not answer patient is due on Lisinopril/HCTZ 20/25 mg. [facility] said patient last pick up was on 05/20/18 for a 30 days supply. [name] is showing past due under [facility] Ins.   Lillia Abed CPhT Pharmacy Technician [facility] Care Management Direct Dial [phone]  Fax [phone] [email or web address]

## 2018-07-18 ENCOUNTER — Other Ambulatory Visit: Payer: Self-pay | Admitting: Family Medicine

## 2018-08-07 ENCOUNTER — Other Ambulatory Visit: Payer: Self-pay | Admitting: Physician Assistant

## 2018-08-07 DIAGNOSIS — E1165 Type 2 diabetes mellitus with hyperglycemia: Principal | ICD-10-CM

## 2018-08-07 DIAGNOSIS — IMO0002 Reserved for concepts with insufficient information to code with codable children: Secondary | ICD-10-CM

## 2018-08-07 DIAGNOSIS — E11319 Type 2 diabetes mellitus with unspecified diabetic retinopathy without macular edema: Secondary | ICD-10-CM

## 2018-08-07 DIAGNOSIS — Z794 Long term (current) use of insulin: Principal | ICD-10-CM

## 2018-08-17 ENCOUNTER — Other Ambulatory Visit: Payer: Self-pay | Admitting: Physician Assistant

## 2018-08-17 DIAGNOSIS — I152 Hypertension secondary to endocrine disorders: Secondary | ICD-10-CM

## 2018-08-17 DIAGNOSIS — I10 Essential (primary) hypertension: Principal | ICD-10-CM

## 2018-08-17 DIAGNOSIS — E1159 Type 2 diabetes mellitus with other circulatory complications: Secondary | ICD-10-CM

## 2018-08-24 ENCOUNTER — Ambulatory Visit (INDEPENDENT_AMBULATORY_CARE_PROVIDER_SITE_OTHER): Payer: Medicare Other | Admitting: Physician Assistant

## 2018-08-24 ENCOUNTER — Encounter: Payer: Self-pay | Admitting: Physician Assistant

## 2018-08-24 VITALS — BP 181/83 | HR 83

## 2018-08-24 DIAGNOSIS — I10 Essential (primary) hypertension: Secondary | ICD-10-CM | POA: Diagnosis not present

## 2018-08-24 DIAGNOSIS — Z1159 Encounter for screening for other viral diseases: Secondary | ICD-10-CM

## 2018-08-24 DIAGNOSIS — Z1329 Encounter for screening for other suspected endocrine disorder: Secondary | ICD-10-CM

## 2018-08-24 DIAGNOSIS — E1159 Type 2 diabetes mellitus with other circulatory complications: Secondary | ICD-10-CM | POA: Diagnosis not present

## 2018-08-24 DIAGNOSIS — E1169 Type 2 diabetes mellitus with other specified complication: Secondary | ICD-10-CM

## 2018-08-24 DIAGNOSIS — J4530 Mild persistent asthma, uncomplicated: Secondary | ICD-10-CM

## 2018-08-24 DIAGNOSIS — E11319 Type 2 diabetes mellitus with unspecified diabetic retinopathy without macular edema: Secondary | ICD-10-CM

## 2018-08-24 DIAGNOSIS — IMO0002 Reserved for concepts with insufficient information to code with codable children: Secondary | ICD-10-CM

## 2018-08-24 DIAGNOSIS — Z794 Long term (current) use of insulin: Secondary | ICD-10-CM

## 2018-08-24 DIAGNOSIS — E785 Hyperlipidemia, unspecified: Secondary | ICD-10-CM

## 2018-08-24 DIAGNOSIS — I152 Hypertension secondary to endocrine disorders: Secondary | ICD-10-CM

## 2018-08-24 DIAGNOSIS — E1165 Type 2 diabetes mellitus with hyperglycemia: Secondary | ICD-10-CM

## 2018-08-24 MED ORDER — LISINOPRIL 30 MG PO TABS: 30.0000 mg | ORAL_TABLET | Freq: Every day | ORAL | 0 refills | Status: DC

## 2018-08-24 MED ORDER — CARVEDILOL 12.5 MG PO TABS: 12.5000 mg | ORAL_TABLET | Freq: Two times a day (BID) | ORAL | 0 refills | Status: DC

## 2018-08-24 MED ORDER — CHLORTHALIDONE 25 MG PO TABS: 25.0000 mg | ORAL_TABLET | Freq: Every day | ORAL | 0 refills | Status: DC

## 2018-08-24 NOTE — Progress Notes (Signed)
Virtual Visit via Video Note  I connected with Jamie Strickland on 08/24/18 at  9:50 AM EDT by a video enabled telemedicine application and verified that I am speaking with the correct person using two identifiers.   I discussed the limitations of evaluation and management by telemedicine and the availability of in person appointments. The patient expressed understanding and agreed to proceed.  History of Present Illness: HPI:                                                                Jamie Strickland is a 73 y.o. female   CC: medication refills  Asthma: taking Wixela twice a day and Singulair. reports pollen is a trigger. Reports she was using rescue inhaler 2-3 times per day 2 weeks ago. It has been improving over the last week and she has not used rescue in the last 3-4 days. Denies nocturnal cough/wheeze. Denies exacerbation in the last year.   HTN: hx of difficult to control stage 2 hypertension. Taking Lisinopril-HCTZ and Amlodipine 10 mg. She states she is compliant with her medication, but there is a note from pharmacy outreach dated 2/27 stating she is not taking her ACE/thiazide.  Denies vision change, headache, chest pain with exertion, orthopnea, lightheadedness, syncope and edema. Risk factors include: DM2, HLD, postmenopausal, family hx, obesity  DMII: uncontrolled, last A1C 8.5, improved from 9.2. Taking 40 Units of Tresiba, Humalog 25 units at mealtime, and Metformin XR, Ozempic 1 mg weekly.  Compliant with medications.  Denies polydipsia, polyuria, polyphagia. Denies blurred vision or vision change. Denies extremity pain, altered sensation and paresthesias.  Denies ulcers/wounds on feet. Hx of DKA/HHS: never Glucometer: checks daily, no blood sugar log today Blood glucose readings: n/a Hypoglycemia frequency: none Severe hypoglycemia (requiring 3rd party assistance): none   Past Medical History:  Diagnosis Date  . Allergic rhinitis   . Diabetes mellitus type II   .  Hx of colonic polyps   . Hyperlipidemia   . Hypertension   . Mild intermittent allergic asthma without complication 12/28/2007   Qualifier: Diagnosis of  By: Alphonsus Sias MD, Ronnette Hila   . Mild mitral regurgitation 08/10/2017  . Mild nonproliferative diabetic retinopathy (HCC) 09/25/2014   Dxed Dr Blair Promise, 09/11/2014.   Changes noted in bilateral eyes?   . Osteoarthritis    Past Surgical History:  Procedure Laterality Date  . 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 CATARACT EXTRACTION W/ INTRAOCULAR LENS  IMPLANT, BILATERAL Bilateral 2014  . CATARACT EXTRACTION W/ INTRAOCULAR LENS  IMPLANT, BILATERAL  11/14  . CHOLECYSTECTOMY    . LASIK     Social History   Tobacco Use  . Smoking status: Former Games developer  . Smokeless tobacco: Former Neurosurgeon    Quit date: 08/09/2008  Substance Use Topics  . Alcohol use: Yes    Alcohol/week: 0.0 standard drinks    Comment: Rare   family history includes Asthma (age of onset: 71) in her father; Cancer in her brother and maternal aunt; Diabetes in her father and mother; Heart disease in her brother, father, maternal grandfather, and mother; Liver disease in her father.    ROS: negative except as noted in the HPI  Medications: Current Outpatient Medications  Medication  Sig Dispense Refill  . albuterol (PROVENTIL) (5 MG/ML) 0.5% nebulizer solution Take 0.5 mLs (2.5 mg total) by nebulization every 4 (four) hours as needed for wheezing or shortness of breath. 20 mL 5  . amLODipine (NORVASC) 10 MG tablet TAKE 1 TABLET BY MOUTH EVERY DAY 90 tablet 1  . aspirin EC 81 MG tablet Take 81 mg by mouth daily.    Marland Kitchen. atorvastatin (LIPITOR) 20 MG tablet TAKE 1 TABLET(20 MG) BY MOUTH DAILY 90 tablet 3  . ATROVENT HFA 17 MCG/ACT inhaler INHALE 2-3 PUFFS INTO THE LUNGS EVERY 6 (SIX) HOURS AS NEEDED FOR WHEEZING. 12.9 Inhaler 5  . BD PEN NEEDLE NANO U/F 32G X 4 MM MISC USE DAILY 100 each 1  . carvedilol (COREG) 6.25 MG tablet  TAKE 1 TABLET (6.25 MG TOTAL) BY MOUTH 2 (TWO) TIMES DAILY WITH A MEAL. 180 tablet 0  . glucose blood test strip USE AS INSTRUCTED 100 each 3  . HUMALOG 100 UNIT/ML injection INJECT 25 UNITS TOTAL INTO THE SKIN 3 (THREE) TIMES DAILY BEFORE MEALS. 30 mL 5  . Insulin Pen Needle (NOVOFINE PLUS) 32G X 4 MM MISC For weekly injection with Ozempic 30 each 1  . Insulin Syringe-Needle U-100 (INSULIN SYRINGE .5CC/30GX5/16") 30G X 5/16" 0.5 ML MISC USE AS DIRECTED 100 each 1  . levocetirizine (XYZAL ALLERGY 24HR) 5 MG tablet Take 1 tablet (5 mg total) by mouth every evening. 90 tablet 3  . lisinopril-hydrochlorothiazide (PRINZIDE,ZESTORETIC) 20-25 MG tablet TAKE 1 TABLET BY MOUTH EVERY DAY 30 tablet 0  . metFORMIN (GLUCOPHAGE-XR) 500 MG 24 hr tablet Take 2 tablets (1,000 mg total) by mouth 2 (two) times daily with a meal. 360 tablet 1  . montelukast (SINGULAIR) 10 MG tablet Take 1 tablet (10 mg total) by mouth at bedtime. 90 tablet 3  . OZEMPIC, 1 MG/DOSE, 2 MG/1.5ML SOPN INJECT 1 MG INTO THE SKIN ONCE A WEEK. 3 pen 1  . PROAIR HFA 108 (90 Base) MCG/ACT inhaler INHALE 1-2 PUFFS INTO THE LUNGS EVERY 4 (FOUR) HOURS AS NEEDED FOR WHEEZING OR SHORTNESS OF BREATH 8.5 Inhaler 5  . TRESIBA FLEXTOUCH 200 UNIT/ML SOPN INJECT 40 UNITS INTO THE SKIN EVERY MORNING. 9 pen 1  . WIXELA INHUB 250-50 MCG/DOSE AEPB TAKE 1 PUFF BY MOUTH TWICE A DAY 180 each 2   No current facility-administered medications for this visit.    Allergies  Allergen Reactions  . Invokana [Canagliflozin] Nausea And Vomiting and Other (See Comments)    Yeast infections  . Codeine Nausea And Vomiting    REACTION: n /v       Objective:  BP (!) 180/83   Pulse 83  Neuro: alert and oriented x 3 Psych: cooperative, euthymic mood, affect mood-congruent, speech is articulate, normal rate and volume; thought processes clear and goal-directed, normal judgment, good insight   No results found for this or any previous visit (from the past 72  hour(s)). No results found.    Assessment and Plan: 73 y.o. female with   .Jamie Strickland was seen today for hyperglycemia.  Diagnoses and all orders for this visit:  Uncontrolled stage 2 hypertension -     chlorthalidone (HYGROTON) 25 MG tablet; Take 1 tablet (25 mg total) by mouth daily. -     lisinopril (PRINIVIL,ZESTRIL) 30 MG tablet; Take 1 tablet (30 mg total) by mouth daily. -     TSH + free T4  Hypertension associated with type 2 diabetes mellitus (HCC) -     chlorthalidone (HYGROTON) 25  MG tablet; Take 1 tablet (25 mg total) by mouth daily. -     lisinopril (PRINIVIL,ZESTRIL) 30 MG tablet; Take 1 tablet (30 mg total) by mouth daily. -     carvedilol (COREG) 12.5 MG tablet; Take 1 tablet (12.5 mg total) by mouth 2 (two) times daily with a meal. -     COMPLETE METABOLIC PANEL WITH GFR  Uncontrolled type 2 diabetes mellitus with retinopathy, with long-term current use of insulin (HCC) -     Hemoglobin A1c  Hyperlipidemia associated with type 2 diabetes mellitus (HCC) -     Lipid Panel w/reflex Direct LDL  Encounter for hepatitis C screening test for low risk patient -     Hepatitis C antibody  Screening for thyroid disorder -     TSH + free T4   HTN - uncontrolled, stage 2, asymptomatic. D/c Lisinopril-HCTZ 20-25 mg. Increasing Lisinopril to 30 mg, switching to Chlorthalidone  and increasing Coreg to 12.5 mg bid. Recheck CMP in 2 weeks  DM2 - virtual visit, no A1C today, unable to perform foot exam, eye exam was cancelled by office due to COVID. She will have A1C drawn in lab in 2 weeks. Cont current medications for now  Follow-up in 2 weeks for virtual BP check/review lab results  Follow-up in 3 months in office for foot exam/diabetes follow-up   Follow Up Instructions:    I discussed the assessment and treatment plan with the patient. The patient was provided an opportunity to ask questions and all were answered. The patient agreed with the plan and demonstrated an  understanding of the instructions.   The patient was advised to call back or seek an in-person evaluation if the symptoms worsen or if the condition fails to improve as anticipated.  I provided 25 minutes of non-face-to-face time during this encounter.   Carlis Stable, [place]

## 2018-08-28 ENCOUNTER — Other Ambulatory Visit: Payer: Self-pay | Admitting: Osteopathic Medicine

## 2018-08-28 DIAGNOSIS — I10 Essential (primary) hypertension: Secondary | ICD-10-CM

## 2018-09-09 ENCOUNTER — Ambulatory Visit (INDEPENDENT_AMBULATORY_CARE_PROVIDER_SITE_OTHER): Payer: Medicare Other | Admitting: Physician Assistant

## 2018-09-09 ENCOUNTER — Telehealth: Payer: Self-pay | Admitting: Physician Assistant

## 2018-09-09 VITALS — BP 150/64 | HR 74 | Wt 210.0 lb

## 2018-09-09 DIAGNOSIS — E1159 Type 2 diabetes mellitus with other circulatory complications: Secondary | ICD-10-CM

## 2018-09-09 DIAGNOSIS — Z9181 History of falling: Secondary | ICD-10-CM

## 2018-09-09 DIAGNOSIS — I10 Essential (primary) hypertension: Secondary | ICD-10-CM | POA: Diagnosis not present

## 2018-09-09 DIAGNOSIS — Z794 Long term (current) use of insulin: Secondary | ICD-10-CM

## 2018-09-09 DIAGNOSIS — E11319 Type 2 diabetes mellitus with unspecified diabetic retinopathy without macular edema: Secondary | ICD-10-CM | POA: Diagnosis not present

## 2018-09-09 DIAGNOSIS — E1165 Type 2 diabetes mellitus with hyperglycemia: Secondary | ICD-10-CM | POA: Diagnosis not present

## 2018-09-09 DIAGNOSIS — IMO0002 Reserved for concepts with insufficient information to code with codable children: Secondary | ICD-10-CM

## 2018-09-09 DIAGNOSIS — Z1159 Encounter for screening for other viral diseases: Secondary | ICD-10-CM | POA: Diagnosis not present

## 2018-09-09 DIAGNOSIS — E1169 Type 2 diabetes mellitus with other specified complication: Secondary | ICD-10-CM | POA: Diagnosis not present

## 2018-09-09 DIAGNOSIS — I152 Hypertension secondary to endocrine disorders: Secondary | ICD-10-CM

## 2018-09-09 NOTE — Telephone Encounter (Signed)
Jamie Strickland is in the donut hole Can you work on getting her Evaristo Bury samples (as many as possible)

## 2018-09-09 NOTE — Progress Notes (Signed)
Virtual Visit via Video Note  I connected with Jamie Strickland on 09/16/18 at  1:40 PM EDT by a video enabled telemedicine application and verified that I am speaking with the correct person using two identifiers.   I discussed the limitations of evaluation and management by telemedicine and the availability of in person appointments. The patient expressed understanding and agreed to proceed.  History of Present Illness: HPI:                                                                Jamie Strickland is a 73 y.o. female   CC: follow-up, chronic disease mgmt  Type 2 Diabetes: She states she is in the donut hole $4200. She was not able to fill Ozempic. Her Tresiba, Humalog and Wixela were over $600 combined. She did fill her [place]iba and Humalog. Her fasting glucose today was 158. A1C is still pending.  HTN: hx of difficult to control stage 2 hypertension. She states she is compliant with her medication, but there is a note from pharmacy outreach dated 2/27 stating she is not taking her ACE/thiazide.   2 weeks ago medications were adjusted. HCTZ was switchted to Chlorthalidone and Lisinopril increased to 30 mg and Coreg to 12.5 mg in addition to continuing Amlodipine 10 mg. Denies vision change, headache, chest pain with exertion, orthopnea, lightheadedness, syncope and edema. Risk factors include: DM2, HLD, postmenopausal, family hx, obesity  Reports fall yesterday in stairwell of apartment building. States she tripped on some frayed carpet and landed on her side. She states she has bruising of her left hip, but otherwise no injury. Ambulating normally. No head strike/LOC.  Fall Risk  09/09/2018 05/25/2018 02/23/2018 08/17/2016 08/19/2015  Falls in the past year? 1 0 No No No  Number falls in past yr: 0 0 - - -  Injury with Fall? 1 - - - -  Risk for fall due to : History of fall(s) - - - -     Past Medical History:  Diagnosis Date  . Allergic rhinitis   . Diabetes mellitus type II   . Hx  of colonic polyps   . Hyperlipidemia   . Hypertension   . Mild intermittent allergic asthma without complication 12/28/2007   Qualifier: Diagnosis of  By: Alphonsus SiasLetvak MD, Ronnette Hilaichard Ira   . Mild mitral regurgitation 08/10/2017  . Mild nonproliferative diabetic retinopathy (HCC) 09/25/2014   Dxed Dr Blair PromiseNeill Bulakowski, 09/11/2014.   Changes noted in bilateral eyes?   . Osteoarthritis    Past Surgical History:  Procedure Laterality Date  . 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 CATARACT EXTRACTION W/ INTRAOCULAR LENS  IMPLANT, BILATERAL Bilateral 2014  . CATARACT EXTRACTION W/ INTRAOCULAR LENS  IMPLANT, BILATERAL  11/14  . CHOLECYSTECTOMY    . LASIK     Social History   Tobacco Use  . Smoking status: Former Games developermoker  . Smokeless tobacco: Former NeurosurgeonUser    Quit date: 08/09/2008  Substance Use Topics  . Alcohol use: Yes    Alcohol/week: 0.0 standard drinks    Comment: Rare   family history includes Asthma (age of onset: 2278) in her father; Cancer in her brother and maternal aunt; Diabetes in her father and mother; Heart  disease in her brother, father, maternal grandfather, and mother; Liver disease in her father.    ROS: negative except as noted in the HPI  Medications: Current Outpatient Medications  Medication Sig Dispense Refill  . albuterol (PROVENTIL) (5 MG/ML) 0.5% nebulizer solution Take 0.5 mLs (2.5 mg total) by nebulization every 4 (four) hours as needed for wheezing or shortness of breath. 20 mL 5  . amLODipine (NORVASC) 10 MG tablet TAKE 1 TABLET BY MOUTH EVERY DAY 90 tablet 1  . aspirin EC 81 MG tablet Take 81 mg by mouth daily.    Marland Kitchen atorvastatin (LIPITOR) 20 MG tablet TAKE 1 TABLET(20 MG) BY MOUTH DAILY 90 tablet 3  . ATROVENT HFA 17 MCG/ACT inhaler INHALE 2-3 PUFFS INTO THE LUNGS EVERY 6 (SIX) HOURS AS NEEDED FOR WHEEZING. 12.9 Inhaler 5  . BD PEN NEEDLE NANO U/F 32G X 4 MM MISC USE DAILY 100 each 1  . carvedilol (COREG) 12.5 MG tablet Take  1 tablet (12.5 mg total) by mouth 2 (two) times daily with a meal. 180 tablet 0  . chlorthalidone (HYGROTON) 25 MG tablet Take 1 tablet (25 mg total) by mouth daily. 90 tablet 0  . glucose blood test strip USE AS INSTRUCTED 100 each 3  . HUMALOG 100 UNIT/ML injection INJECT 25 UNITS TOTAL INTO THE SKIN 3 (THREE) TIMES DAILY BEFORE MEALS. 30 mL 5  . Insulin Pen Needle (NOVOFINE PLUS) 32G X 4 MM MISC For weekly injection with Ozempic 30 each 1  . Insulin Syringe-Needle U-100 (INSULIN SYRINGE .5CC/30GX5/16") 30G X 5/16" 0.5 ML MISC USE AS DIRECTED 100 each 1  . levocetirizine (XYZAL ALLERGY 24HR) 5 MG tablet Take 1 tablet (5 mg total) by mouth every evening. 90 tablet 3  . lisinopril (PRINIVIL,ZESTRIL) 30 MG tablet Take 1 tablet (30 mg total) by mouth daily. 90 tablet 0  . metFORMIN (GLUCOPHAGE-XR) 500 MG 24 hr tablet Take 2 tablets (1,000 mg total) by mouth 2 (two) times daily with a meal. 360 tablet 1  . montelukast (SINGULAIR) 10 MG tablet Take 1 tablet (10 mg total) by mouth at bedtime. 90 tablet 3  . PROAIR HFA 108 (90 Base) MCG/ACT inhaler INHALE 1-2 PUFFS INTO THE LUNGS EVERY 4 (FOUR) HOURS AS NEEDED FOR WHEEZING OR SHORTNESS OF BREATH 8.5 Inhaler 5  . TRESIBA FLEXTOUCH 200 UNIT/ML SOPN INJECT 40 UNITS INTO THE SKIN EVERY MORNING. 9 pen 1  . WIXELA INHUB 250-50 MCG/DOSE AEPB TAKE 1 PUFF BY MOUTH TWICE A DAY 180 each 2   No current facility-administered medications for this visit.    Allergies  Allergen Reactions  . Invokana [Canagliflozin] Nausea And Vomiting and Other (See Comments)    Yeast infections  . Codeine Nausea And Vomiting    REACTION: n /v       Objective:  BP (!) 150/64   Pulse 74   Wt 210 lb (95.3 kg)   BMI 38.41 kg/m    BP Readings from Last 3 Encounters:  09/09/18 (!) 150/64  08/24/18 (!) 181/83  05/25/18 (!) 153/84     Assessment and Plan: 73 y.o. female with   .Jamie Strickland was seen today for follow-up.  Diagnoses and all orders for this  visit:  Hypertension associated with type 2 diabetes mellitus (HCC)  Uncontrolled type 2 diabetes mellitus with retinopathy, with long-term current use of insulin (HCC)  At moderate risk for fall    HTN - CMP drawn today and pending. BP has improved 30 points systolic and 20 points diastolic  with medication changes. Cont current regimen  DM2 - A1C pending. Unfortunately she is in the donut hole and unable to afford her GLP-1 and insulins are stretching her budget. I have ordered samples from the pharmaceutical company for her.   Falls prevention discussed  Follow-up in 3 months in office for foot exam/diabetes follow-up   Follow Up Instructions:    I discussed the assessment and treatment plan with the patient. The patient was provided an opportunity to ask questions and all were answered. The patient agreed with the plan and demonstrated an understanding of the instructions.   The patient was advised to call back or seek an in-person evaluation if the symptoms worsen or if the condition fails to improve as anticipated.  I provided 5-10 minutes of non-face-to-face time during this encounter.   Carlis Stable, [place]

## 2018-09-09 NOTE — Telephone Encounter (Signed)
Patient was notified that we would call her once they are in the office. She voices understanding and did not have any additional questions.

## 2018-09-12 LAB — COMPLETE METABOLIC PANEL WITH GFR
AG Ratio: 1.6 (calc) (ref 1.0–2.5)
ALT: 41 U/L — ABNORMAL HIGH (ref 6–29)
AST: 21 U/L (ref 10–35)
Albumin: 4.2 g/dL (ref 3.6–5.1)
Alkaline phosphatase (APISO): 75 U/L (ref 37–153)
BUN/Creatinine Ratio: 32 (calc) — ABNORMAL HIGH (ref 6–22)
BUN: 28 mg/dL — ABNORMAL HIGH (ref 7–25)
CO2: 25 mmol/L (ref 20–32)
Calcium: 9.7 mg/dL (ref 8.6–10.4)
Chloride: 101 mmol/L (ref 98–110)
Creat: 0.88 mg/dL (ref 0.60–0.93)
GFR, Est African American: 77 mL/min/{1.73_m2} (ref >=60)
GFR, Est Non African American: 66 mL/min/{1.73_m2} (ref >=60)
Globulin: 2.7 g/dL (calc) (ref 1.9–3.7)
Glucose, Bld: 306 mg/dL — ABNORMAL HIGH (ref 65–99)
Potassium: 4.5 mmol/L (ref 3.5–5.3)
Sodium: 137 mmol/L (ref 135–146)
Total Bilirubin: 0.3 mg/dL (ref 0.2–1.2)
Total Protein: 6.9 g/dL (ref 6.1–8.1)

## 2018-09-12 LAB — LIPID PANEL W/REFLEX DIRECT LDL
Cholesterol: 146 mg/dL (ref <200)
HDL: 37 mg/dL — ABNORMAL LOW (ref >=50)
LDL Cholesterol (Calc): 78 mg/dL (calc)
Non-HDL Cholesterol (Calc): 109 mg/dL (calc) (ref <130)
Total CHOL/HDL Ratio: 3.9 (calc) (ref <5.0)
Triglycerides: 224 mg/dL — ABNORMAL HIGH (ref <150)

## 2018-09-12 LAB — HEMOGLOBIN A1C
Hgb A1c MFr Bld: 9.6 % of total Hgb — ABNORMAL HIGH (ref <5.7)
Mean Plasma Glucose: 229 (calc)
eAG (mmol/L): 12.7 (calc)

## 2018-09-12 LAB — HEPATITIS C ANTIBODY
Hepatitis C Ab: NONREACTIVE
SIGNAL TO CUT-OFF: 0.01 (ref <1.00)

## 2018-09-12 LAB — TSH+FREE T4: TSH W/REFLEX TO FT4: 2.4 mIU/L (ref 0.40–4.50)

## 2018-09-12 NOTE — Progress Notes (Signed)
A1C is increased at 9.6 Would like her to increase [place] to 42 units Adjust Tresiba by 2 units every 3 days until morning fasting blood sugar is 100-130 Adjust Humalog by 1 unit every 3 days until after meal blood sugar is 180 I have ordered samples of Tresiba, Ozempic and Humalog for her and will contact her when these arrive

## 2018-09-16 ENCOUNTER — Encounter: Payer: Self-pay | Admitting: Physician Assistant

## 2018-09-16 DIAGNOSIS — Z9181 History of falling: Secondary | ICD-10-CM | POA: Insufficient documentation

## 2018-09-21 ENCOUNTER — Telehealth: Payer: Self-pay | Admitting: Physician Assistant

## 2018-09-21 NOTE — Telephone Encounter (Signed)
FYI, I ordered samples from [employer] details are below When samples arrive please label each sample with patient label and contact patient   Date: 2018-09-09 Order #: [id number] Ship to Address: [address] [place], [place] [address] Products / Quantities Request: Product Name Elayne Snare [id number] - Ozempic Pen 2mg /1.71mL (1.34 mg/mL) w/6 NovoFine Plus Needles - DTP 20  Evaristo Bury [id number] - Evaristo Bury U200 1x22mL w/7 NovoFine Plus 32G Needles - DTP 10  Evaristo Bury [id number] - Evaristo Bury Vial 1X17mL - DTP 3  NovoLog [id number] - NovoLog Vial 1x9mL - DTP 5  NovoFine 32 [id number] - NovoFine 32G Needles (contains 7 needles per box) - DTP 10

## 2018-09-23 ENCOUNTER — Encounter: Payer: Self-pay | Admitting: Physician Assistant

## 2018-10-06 ENCOUNTER — Telehealth: Payer: Self-pay

## 2018-10-06 NOTE — Telephone Encounter (Signed)
Spoke with patient She will pick-up Monday or Tuesday 6/01-6/02 Also reviewed her lab results and insulin dosing with her

## 2018-10-06 NOTE — Telephone Encounter (Signed)
Sample Lot and Expiration date.   ozempic = 14 Lot: [id number]  Ex: 12/29/19  tresiba 200 = 5 Lot: [id number] Ex: 01/2020  tresiba 100 = 3 Lot: [id number] Ex: 02/2020  novolog = 5 Lot: [id number] Ex: 10/2019  novofine = 5 Lot: [id number]  Ex: 05/10/20

## 2018-10-18 ENCOUNTER — Other Ambulatory Visit: Payer: Self-pay

## 2018-10-18 NOTE — Patient Outreach (Signed)
[facility]) Care Management  10/18/2018  Jamie Strickland [birth year] [id number]   Medication Adherence call to [name] patient did not answer patient is showing past due on Atorvastatin 20 mg under [facility] ins.   [place] Management Direct Dial [phone]  Fax [phone] [email or web address]

## 2018-11-02 DIAGNOSIS — Z961 Presence of intraocular lens: Secondary | ICD-10-CM | POA: Diagnosis not present

## 2018-11-02 DIAGNOSIS — R6889 Other general symptoms and signs: Secondary | ICD-10-CM | POA: Diagnosis not present

## 2018-11-02 DIAGNOSIS — H47011 Ischemic optic neuropathy, right eye: Secondary | ICD-10-CM | POA: Diagnosis not present

## 2018-11-02 LAB — HM DIABETES EYE EXAM

## 2018-11-08 ENCOUNTER — Other Ambulatory Visit: Payer: Self-pay | Admitting: Physician Assistant

## 2018-11-08 DIAGNOSIS — E1159 Type 2 diabetes mellitus with other circulatory complications: Secondary | ICD-10-CM

## 2018-11-09 ENCOUNTER — Ambulatory Visit: Payer: Medicare Other | Admitting: Physician Assistant

## 2018-11-10 ENCOUNTER — Encounter: Payer: Self-pay | Admitting: Physician Assistant

## 2018-11-10 ENCOUNTER — Other Ambulatory Visit: Payer: Self-pay

## 2018-11-10 ENCOUNTER — Ambulatory Visit (INDEPENDENT_AMBULATORY_CARE_PROVIDER_SITE_OTHER): Payer: Medicare Other | Admitting: Physician Assistant

## 2018-11-10 VITALS — BP 156/73 | HR 75 | Temp 97.5°F | Wt 223.0 lb

## 2018-11-10 DIAGNOSIS — Z794 Long term (current) use of insulin: Secondary | ICD-10-CM | POA: Diagnosis not present

## 2018-11-10 DIAGNOSIS — IMO0002 Reserved for concepts with insufficient information to code with codable children: Secondary | ICD-10-CM

## 2018-11-10 DIAGNOSIS — E1165 Type 2 diabetes mellitus with hyperglycemia: Secondary | ICD-10-CM

## 2018-11-10 DIAGNOSIS — H468 Other optic neuritis: Secondary | ICD-10-CM

## 2018-11-10 DIAGNOSIS — I10 Essential (primary) hypertension: Secondary | ICD-10-CM | POA: Diagnosis not present

## 2018-11-10 DIAGNOSIS — E11319 Type 2 diabetes mellitus with unspecified diabetic retinopathy without macular edema: Secondary | ICD-10-CM | POA: Diagnosis not present

## 2018-11-10 DIAGNOSIS — I152 Hypertension secondary to endocrine disorders: Secondary | ICD-10-CM

## 2018-11-10 DIAGNOSIS — E1159 Type 2 diabetes mellitus with other circulatory complications: Secondary | ICD-10-CM

## 2018-11-10 LAB — POCT GLYCOSYLATED HEMOGLOBIN (HGB A1C): HbA1c, POC (controlled diabetic range): 9.6 % — AB (ref 0.0–7.0)

## 2018-11-10 MED ORDER — CLOPIDOGREL BISULFATE 75 MG PO TABS: 75.0000 mg | ORAL_TABLET | Freq: Every day | ORAL | 0 refills | Status: DC

## 2018-11-10 MED ORDER — OZEMPIC (1 MG/DOSE) 2 MG/1.5ML ~~LOC~~ SOPN: 1.0000 mg | PEN_INJECTOR | SUBCUTANEOUS | 0 refills | Status: DC

## 2018-11-10 MED ORDER — ASPIRIN 325 MG PO TBEC: 325.0000 mg | DELAYED_RELEASE_TABLET | Freq: Every day | ORAL | Status: DC

## 2018-11-10 MED ORDER — CARVEDILOL 12.5 MG PO TABS: 12.5000 mg | ORAL_TABLET | Freq: Two times a day (BID) | ORAL | 1 refills | Status: DC

## 2018-11-10 NOTE — Progress Notes (Signed)
HPI:                                                                Jamie Strickland is a 73 y.o. female [facility] today for medication management  On 11/02/18 (approx 1 week ago) patient was seen at [facility] for sudden right sided partial vision loss. She was diagnosed with Ischemic Optic Neuritis. She was referred to retina specialist (Dr. Wynelle LinkSun) and instructed to follow-up with me for carotid doppler.  Patient states she continues to have a gray horizontal line bisecting her right visual field. She denies any other symptoms. At the time of the event she reports she had sudden onset grayed out/visual loss in the top half of her right eye without any other symptoms. Denies headache, focal weakness, word finding difficulty, dysarthria, paresthesias. Denies jaw claudication, muscle weakness or double vision.  Admits she has not been taking her baby asa, but states she has been compliant with her statin. Reports family hx of stroke in both of her parents.  Type 2 Diabetes: poorly controlled A1C 09/09/18 9.6 05/25/18 8.5 02/23/18 9.2 08/02/17 8.1 She has self-titrated Tresiba to 55U in the evening, Humalog 40 mg tid before meals and Ozempic 0.5 mg weekly.  Home glucose log reviewed. She did not note which readings were fasting and post-prandial. Readings are highly variable, multiple readings above 300, max glucose 535  HTN: hx of difficult to control stage 2 hypertension. She is taking Chlorthalidone 25 mg, Lisinopril 30 mg, Amlodipine 10 mg, and Coreg to 12.5 mg bid. Denies vision change, headache, chest painwith exertion, orthopnea, lightheadedness,syncopeand edema. Risk factors include: DM2, HLD, postmenopausal, family hx, obesity  History of fall: fall in stairwell of apartment building 2 months ago. No additional falls   Past Medical History:  Diagnosis Date  . Allergic rhinitis   . Diabetes  mellitus type II   . Hx of colonic polyps   . Hyperlipidemia   . Hypertension   . Mild intermittent allergic asthma without complication 12/28/2007   Qualifier: Diagnosis of  By: Alphonsus SiasLetvak MD, Ronnette Hilaichard Ira   . Mild mitral regurgitation 08/10/2017  . Mild nonproliferative diabetic retinopathy (HCC) 09/25/2014   Dxed Dr Blair PromiseNeill Bulakowski, 09/11/2014.   Changes noted in bilateral eyes?   . Osteoarthritis    Past Surgical History:  Procedure Laterality Date  . 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 CATARACT EXTRACTION W/ INTRAOCULAR LENS  IMPLANT, BILATERAL Bilateral 2014  . CATARACT EXTRACTION W/ INTRAOCULAR LENS  IMPLANT, BILATERAL  11/14  . CHOLECYSTECTOMY    . LASIK     Social History   Tobacco Use  . Smoking status: Former Games developermoker  . Smokeless tobacco: Former NeurosurgeonUser    Quit date: 08/09/2008  Substance Use Topics  . Alcohol use: Yes    Alcohol/week: 0.0 standard drinks    Comment: Rare   family history includes Asthma (age of onset: 5978) in her father; Cancer in her brother and maternal aunt; Diabetes in her father and mother; Heart disease in her brother, father, maternal grandfather, and mother; Liver disease in her father.    ROS: negative except as noted in the HPI  Medications: Current Outpatient Medications  Medication Sig Dispense Refill  . albuterol (PROVENTIL) (5 MG/ML) 0.5% nebulizer solution Take 0.5 mLs (2.5 mg total) by nebulization every 4 (four) hours as needed for wheezing or shortness of breath. 20 mL 5  . amLODipine (NORVASC) 10 MG tablet TAKE 1 TABLET BY MOUTH EVERY DAY 90 tablet 1  . atorvastatin (LIPITOR) 20 MG tablet TAKE 1 TABLET(20 MG) BY MOUTH DAILY 90 tablet 3  . ATROVENT HFA 17 MCG/ACT inhaler INHALE 2-3 PUFFS INTO THE LUNGS EVERY 6 (SIX) HOURS AS NEEDED FOR WHEEZING. 12.9 Inhaler 5  . BD PEN NEEDLE NANO U/F 32G X 4 MM MISC USE DAILY 100 each 1  . carvedilol (COREG) 12.5 MG tablet Take 1 tablet (12.5 mg total) by  mouth 2 (two) times daily with a meal. 180 tablet 1  . chlorthalidone (HYGROTON) 25 MG tablet Take 1 tablet (25 mg total) by mouth daily. 90 tablet 0  . glucose blood test strip USE AS INSTRUCTED 100 each 3  . HUMALOG 100 UNIT/ML injection INJECT 25 UNITS TOTAL INTO THE SKIN 3 (THREE) TIMES DAILY BEFORE MEALS. 30 mL 5  . Insulin Pen Needle (NOVOFINE PLUS) 32G X 4 MM MISC For weekly injection with Ozempic 30 each 1  . Insulin Syringe-Needle U-100 (INSULIN SYRINGE .5CC/30GX5/16") 30G X 5/16" 0.5 ML MISC USE AS DIRECTED 100 each 1  . levocetirizine (XYZAL ALLERGY 24HR) 5 MG tablet Take 1 tablet (5 mg total) by mouth every evening. 90 tablet 3  . montelukast (SINGULAIR) 10 MG tablet Take 1 tablet (10 mg total) by mouth at bedtime. 90 tablet 3  . PROAIR HFA 108 (90 Base) MCG/ACT inhaler INHALE 1-2 PUFFS INTO THE LUNGS EVERY 4 (FOUR) HOURS AS NEEDED FOR WHEEZING OR SHORTNESS OF BREATH 8.5 Inhaler 5  . TRESIBA FLEXTOUCH 200 UNIT/ML SOPN INJECT 40 UNITS INTO THE SKIN EVERY MORNING. 9 pen 1  . WIXELA INHUB 250-50 MCG/DOSE AEPB TAKE 1 PUFF BY MOUTH TWICE A DAY 180 each 2  . aspirin 325 MG EC tablet Take 1 tablet (325 mg total) by mouth daily.    . clopidogrel (PLAVIX) 75 MG tablet Take 1 tablet (75 mg total) by mouth daily. 90 tablet 0  . diazepam (VALIUM) 5 MG tablet Take 1-2 tab PO 1 hour before procedure or imaging. 2 tablet 0  . lisinopril (ZESTRIL) 30 MG tablet TAKE 1 TABLET BY MOUTH EVERY DAY 90 tablet 0  . metFORMIN (GLUCOPHAGE-XR) 500 MG 24 hr tablet TAKE 2 TABLETS (1,000 MG TOTAL) BY MOUTH 2 (TWO) TIMES DAILY WITH A MEAL. 360 tablet 1  . Semaglutide, 1 MG/DOSE, (OZEMPIC, 1 MG/DOSE,) 2 MG/1.5ML SOPN Inject 1 mg into the skin once a week. 1 pen 0   No current facility-administered medications for this visit.    Allergies  Allergen Reactions  . Invokana [Canagliflozin] Nausea And Vomiting and Other (See Comments)    Yeast infections  . Codeine Nausea And Vomiting    REACTION: n /v        Objective:  BP (!) 156/73   Pulse 75   Temp (!) 97.5 F (36.4 C) (Oral)   Wt 223 lb (101.2 kg)   SpO2 96%   BMI 40.79 kg/m  Wt Readings from Last 3 Encounters:  11/10/18 223 lb (101.2 kg)  09/09/18 210 lb (95.3 kg)  05/25/18 216 lb (98 kg)   Temp Readings from Last 3 Encounters:  11/10/18 (!) 97.5 F (36.4 C) (Oral)  08/02/17 97.7 F (36.5 C) (Oral)  10/04/16 98.4 F (36.9 C) (Oral)   BP Readings from Last 3 Encounters:  11/10/18 (!) 156/73  09/09/18 (!) 150/64  08/24/18 (!) 181/83   Pulse Readings from Last 3 Encounters:  11/10/18 75  09/09/18 74  08/24/18 83   Gen:  alert, not ill-appearing, no distress, appropriate for age, obese female HEENT: head normocephalic without obvious abnormality, conjunctiva and cornea clear, trachea midline Pulm: Normal work of breathing, normal phonation, clear to auscultation bilaterally, no wheezes, rales or rhonchi CV: Normal rate, regular rhythm, s1 and s2 distinct, no murmurs, clicks or rubs; no carotid bruit  Neuro: alert and oriented x 3, no tremor MSK: extremities atraumatic, normal gait and station, no peripheral edema Skin: intact, no rashes on exposed skin, no jaundice, no cyanosis Psych: well-groomed, cooperative, good eye contact, euthymic mood, affect mood-congruent, speech is articulate, and thought processes clear and goal-directed  Lab Results  Component Value Date   CREATININE 0.88 09/09/2018   BUN 28 (H) 09/09/2018   NA 137 09/09/2018   K 4.5 09/09/2018   CL 101 09/09/2018   CO2 25 09/09/2018   Lab Results  Component Value Date   ALT 41 (H) 09/09/2018   AST 21 09/09/2018   ALKPHOS 71 04/27/2016   BILITOT 0.3 09/09/2018   Lab Results  Component Value Date   HGBA1C 9.6 (A) 11/10/2018   Lab Results  Component Value Date   CHOL 146 09/09/2018   HDL 37 (L) 09/09/2018   LDLCALC 78 09/09/2018   LDLDIRECT 58.4 10/17/2012   TRIG 224 (H) 09/09/2018   CHOLHDL 3.9 09/09/2018    The 10-year ASCVD  risk score Mikey Bussing DC Jr., et al., 2013) is: 34.6%   Values used to calculate the score:     Age: 54 years     Sex: Female     Is Non-Hispanic African American: No     Diabetic: Yes     Tobacco smoker: No     Systolic Blood Pressure: 371 mmHg     Is BP treated: Yes     HDL Cholesterol: 37 mg/dL     Total Cholesterol: 146 mg/dL   Assessment and Plan: 73 y.o. female with   .Jamie Strickland was seen today for hyperglycemia.  Diagnoses and all orders for this visit:  Hypertension associated with type 2 diabetes mellitus ([place]) -     POCT HgB A1C -     carvedilol (COREG) 12.5 MG tablet; Take 1 tablet (12.5 mg total) by mouth 2 (two) times daily with a meal.  Ischemic optic neuritis of right eye -     US Carotid Duplex Bilateral -     MR Brain Wo Contrast -     clopidogrel (PLAVIX) 75 MG tablet; Take 1 tablet (75 mg total) by mouth daily. -     aspirin 325 MG EC tablet; Take 1 tablet (325 mg total) by mouth daily. -     Ambulatory referral to Neurology  TIA (transient ischemic attack) -     US Carotid Duplex Bilateral -     MR Brain Wo Contrast -     clopidogrel (PLAVIX) 75 MG tablet; Take 1 tablet (75 mg total) by mouth daily. -     aspirin 325 MG EC tablet; Take 1 tablet (325 mg total) by mouth daily. -     Ambulatory referral to Neurology  Uncontrolled type 2 diabetes mellitus with retinopathy, with long-term current use of insulin ([place]) -     Semaglutide, 1 MG/DOSE, (OZEMPIC, 1 MG/DOSE,)  2 MG/1.5ML SOPN; Inject 1 mg into the skin once a week.   Ischemic optic neuritis of right eye Referred here by ophthalmology for cardiovascular risk management Will obtain MR brain and carotid ultrasound to rule out acute CVA and carotid artery stenosis/embolism/dissection Thankfully she is not having any associated neurologic symptoms apart from the vision loss Consulted with supervising physician, Dr. Lyn HollingsheadAlexander, who recommended Plavix and aspirin and close follow-up with neurology Continue moderate  intensity statin therapy, LDL goal <70, will consider increasing Atorvastatin to 40 mg Counseled patient on signs and symptoms of stroke and emergency return precautions  Uncontrolled HTN Blood pressure is still not at goal, but is improved compared to her usual severe uncontrolled stage II hypertension. Continue amlodipine 10 mg, chlorthalidone 25 mg, lisinopril 30 mg, carvedilol 12-1/2 mg twice daily. Unlikely to benefit from high dose of chlorthalidone or Lisinopril. She is on max dose amlodipine. In the absence of heart failure or arrhythmia I would hesitate to increase her Carvedilol further Will order home sleep study to r/o OSA She had an echocardiogram on 08/09/17 which showed grade 1 diastolic dysfunction and mild MR  Uncontrolled Type 2 DM Lab Results  Component Value Date   HGBA1C 9.6 (A) 11/10/2018  A1C unchanged despite re-starting GLP-1 and self-titrating Evaristo Buryresiba Unlikely to benefit from [place]iba above 60U (exceeding 0.5 U/kg/day), so I advised her to self-titrate to 60U and stop. Discussed with supervising physician who [facility], but cost is a huge barrier and she is already in the donut hole Declines referral to Endocrinology I have provided her with a glucose log to better distinguish her fasting and post-prandial readings Encouraged her to count her carbs   Patient education and anticipatory guidance given Patient agrees with treatment plan Follow-up in 2 months or sooner as needed if symptoms worsen or fail to improve  Levonne Hubertharley E. Cummings PA-C

## 2018-11-10 NOTE — Patient Instructions (Signed)
Transient Ischemic Attack °A transient ischemic attack (TIA) is a "warning stroke" that causes stroke-like symptoms that then go away quickly. The symptoms of a TIA come on suddenly, and they last less than 24 hours. Unlike a stroke, a TIA does not cause permanent damage to the brain. It is important to know the symptoms of a TIA and what to do. Seek medical care right away, even if your symptoms go away. °Having a TIA is a sign that you are at higher risk for a permanent stroke. Lifestyle changes and medical treatments can help prevent a stroke. °What are the causes? °This condition is caused by a temporary blockage in an artery in the head or neck. The blockage does not allow the brain to get the blood supply it needs and can cause various symptoms. The blockage can be caused by: °· Fatty buildup in an artery in the head or neck (atherosclerosis). °· A blood clot. °· Tearing of an artery (dissection). °· Inflammation of an artery (vasculitis). °Sometimes the cause is not known. °What increases the risk? °Certain factors may make you more likely to develop this condition. Some of these factors are things that you can change, such as: °· Obesity. °· Using products that contain nicotine or tobacco, such as cigarettes and e-cigarettes. °· Taking oral birth control, especially if you also use tobacco. °· Lack of physical inactivity. °· Excessive use of alcohol. °· Use of drugs, especially cocaine and methamphetamine. °Other risk factors include: °· High blood pressure (hypertension). °· High cholesterol. °· Diabetes mellitus. °· Heart disease (coronary artery disease). °· Atrial fibrillation. °· Being African American or Hispanic. °· Being over the age of 60. °· Being female. °· Family history of stroke. °· Previous history of blood clots, stroke, TIA, or heart attack. °· Sickle cell disease. °· Being a woman with a history of preeclampsia. °· Migraine headache. °· Sleep apnea. °· Chronic inflammatory diseases, such as  rheumatoid arthritis or lupus. °· Blood clotting disorders (hypercoagulable state). °What are the signs or symptoms? °Symptoms of this condition are the same as those of a stroke, but they are temporary. The symptoms develop suddenly, and they go away quickly, usually within minutes to hours. Symptoms may include sudden: °· Weakness or numbness in your face, arm, or leg, especially on one side of your body. °· Trouble walking or difficulty moving your arms or legs. °· Trouble speaking, understanding speech, or both (aphasia). °· Vision changes in one or both eyes. These include double vision, blurred vision, or loss of vision. °· Dizziness. °· Confusion. °· Loss of balance or coordination. °· Nausea and vomiting. °· Severe headache with no known cause. °If possible, make note of the exact time that you last felt like your normal self and what time your symptoms started. Tell your health care provider. °How is this diagnosed? °This condition may be diagnosed based on: °· Your symptoms and medical history. °· A physical exam. °· Imaging tests, usually a CT or MRI scan of the brain. °· Blood tests. °You may also have other tests, including: °· Electrocardiogram (ECG). °· Echocardiogram. °· Carotid ultrasound. °· A scan of the brain circulation (CT angiogram or MRI angiogram). °· Continuous heart monitoring. °How is this treated? °The goal of treatment is to reduce the risk for a subsequent stroke. Treatment may include stroke prevention therapies such as: °· Changes to diet or lifestyle to decrease your risk. Lifestyle changes may include exercising and stopping smoking. °· Medicines to thin the blood (antiplatelets   or anticoagulants). °· Blood pressure medicines. °· Medicines to reduce cholesterol. °· Treating other health conditions, such as diabetes or atrial fibrillation. °If testing shows that you have narrowing in the arteries to your brain, your health care provider may recommend a procedure, such as: °· Carotid  endarterectomy. This is a surgery to remove the blockage from your artery. °· Carotid angioplasty and stenting. This is a procedure to open or widen an artery in the neck using a metal mesh tube (stent). The stent helps keep the artery open by supporting the artery walls. °Follow these instructions at home: °Medicines ° °· Take over-the-counter and prescription medicines only as told by your health care provider. °· If you were told to take a medicine to thin your blood, such as aspirin or an anticoagulant, take it exactly as told by your health care provider. °? Taking too much blood-thinning medicine can cause bleeding. °? If you do not take enough blood-thinning medicine, you will not have the protection that you need against a stroke and other problems. °Eating and drinking ° °· Eat 5 or more servings of fruits and vegetables each day. °· Follow instructions from your health care provider about diet. You may need to follow a certain nutrition plan to help manage risk factors for stroke, such as high blood pressure, high cholesterol, diabetes, or obesity. This may include: °? Eating a low-fat, low-salt diet. °? Including a lot of fiber in your diet. °? Limiting the amount of carbohydrates and sugar in your diet. °· Limit alcohol intake to no more than 1 drink a day for nonpregnant women and 2 drinks a day for men. One drink equals 12 oz of beer, 5 oz of wine, or 1½ oz of hard liquor. °General instructions °· Maintain a healthy weight. °· Stay physically active. Try to get at least 30 minutes of exercise on most or all days. °· Find out if you have sleep apnea, and seek treatment if needed. °· Do not use any products that contain nicotine or tobacco, such as cigarettes and e-cigarettes. If you need help quitting, ask your health care provider. °· Do not abuse drugs. °· Keep all follow-up visits as told by your health care provider. This is important. °Where to find more information °· American Stroke Association:  www.strokeassociation.org °· National Stroke Association: www.stroke.org °Get help right away if: °· You have chest pain or an irregular heartbeat. °· You have any symptoms of stroke. The acronym BEFAST is an easy way to remember the main warning signs of stroke. °? B - Balance problems. Signs include dizziness, sudden trouble walking, or loss of balance. °? E - Eye problems. This includes trouble seeing or a sudden change in vision. °? F - Face changes. This includes sudden weakness or numbness of the face, or the face or eyelid drooping to one side. °? A - Arm weakness or numbness. This happens suddenly and usually on one side of the body. °? S - Speech problems. This includes trouble speaking or trouble understanding speech. °? T - Time. Time to call 911 or seek emergency care. Do not wait to see if symptoms will go away. Make note of the time your symptoms started. °· Other signs of stroke may include: °? A sudden, severe headache with no known cause. °? Nausea or vomiting. °? Seizure. °These symptoms may represent a serious problem that is an emergency. Do not wait to see if the symptoms will go away. Get medical help right away. Call your   local emergency services (911 in the [place] Do not drive yourself to the hospital. °Summary °· A TIA happens when an artery in the head or neck is blocked, leading to stroke-like symptoms that then go away quickly. The blockage clears before there is any permanent brain damage. A TIA is a medical emergency and requires immediate medical attention. °· Symptoms of this condition are the same as those of a stroke, but they are temporary. The symptoms usually develop suddenly, and they go away quickly, usually within minutes to hours. °· Having a TIA means that you are at high risk of a stroke in the near future. °· Treatment may include medicines to thin the blood as well as medicines, diet changes, and lifestyle changes to manage conditions that increase the risk of another TIA  or a stroke. °This information is not intended to replace advice given to you by your health care provider. Make sure you discuss any questions you have with your health care provider. °Document Released: 02/04/2005 Document Revised: 11/02/2017 Document Reviewed: 06/09/2016 °Elsevier Patient Education © 2020 Elsevier Inc. ° °

## 2018-11-13 ENCOUNTER — Ambulatory Visit: Payer: Medicare Other

## 2018-11-13 ENCOUNTER — Other Ambulatory Visit: Payer: Self-pay

## 2018-11-14 ENCOUNTER — Telehealth: Payer: Self-pay

## 2018-11-14 DIAGNOSIS — F411 Generalized anxiety disorder: Secondary | ICD-10-CM

## 2018-11-14 NOTE — Telephone Encounter (Signed)
Patient was not able to do her MRI yesterday because she felt claustrophobic. Requesting some medication be called in to take prior to MRI.  Let me know when RX is sent in and I will call pt with directions and also let imaging know its OK to reschedule pt

## 2018-11-15 MED ORDER — DIAZEPAM 5 MG PO TABS: ORAL_TABLET | ORAL | 0 refills | Status: DC

## 2018-11-15 NOTE — Telephone Encounter (Signed)
Take 1 tab 1 hour prior to appointment. May take 1 additional tablet if needed.  Requires transportation to/from appointment

## 2018-11-15 NOTE — Telephone Encounter (Signed)
Attempted to call again, no answer

## 2018-11-15 NOTE — Telephone Encounter (Signed)
Patient advised of medication and instructions. Called and advised imaging to re-schedule pt.

## 2018-11-15 NOTE — Telephone Encounter (Signed)
Called pt, no answer and no VM set up.

## 2018-11-17 DIAGNOSIS — H34231 Retinal artery branch occlusion, right eye: Secondary | ICD-10-CM | POA: Diagnosis not present

## 2018-11-17 DIAGNOSIS — H43812 Vitreous degeneration, left eye: Secondary | ICD-10-CM | POA: Diagnosis not present

## 2018-11-20 ENCOUNTER — Other Ambulatory Visit: Payer: Self-pay | Admitting: Physician Assistant

## 2018-11-20 DIAGNOSIS — I10 Essential (primary) hypertension: Secondary | ICD-10-CM

## 2018-11-20 DIAGNOSIS — I152 Hypertension secondary to endocrine disorders: Secondary | ICD-10-CM

## 2018-11-20 DIAGNOSIS — E1159 Type 2 diabetes mellitus with other circulatory complications: Secondary | ICD-10-CM

## 2018-11-21 ENCOUNTER — Other Ambulatory Visit: Payer: Self-pay

## 2018-11-21 ENCOUNTER — Ambulatory Visit (INDEPENDENT_AMBULATORY_CARE_PROVIDER_SITE_OTHER): Payer: Medicare Other

## 2018-11-21 ENCOUNTER — Ambulatory Visit: Payer: Medicare Other

## 2018-11-21 DIAGNOSIS — J3489 Other specified disorders of nose and nasal sinuses: Secondary | ICD-10-CM | POA: Diagnosis not present

## 2018-11-21 DIAGNOSIS — G459 Transient cerebral ischemic attack, unspecified: Secondary | ICD-10-CM

## 2018-11-21 DIAGNOSIS — I6521 Occlusion and stenosis of right carotid artery: Secondary | ICD-10-CM | POA: Diagnosis not present

## 2018-11-21 DIAGNOSIS — H468 Other optic neuritis: Secondary | ICD-10-CM | POA: Diagnosis not present

## 2018-11-21 DIAGNOSIS — H47011 Ischemic optic neuropathy, right eye: Secondary | ICD-10-CM | POA: Diagnosis not present

## 2018-11-21 NOTE — Patient Outreach (Signed)
[facility]) Care Management  11/21/2018  MAGDELINE PRANGE 19-Apr-1946 [id number]   Medication Adherence call to [name] [facility] Identifiers Verify spoke with patient she is past due on Atorvastatin 20 mg patient did not want to engage she did not want it to provide with any information patient hung up the phone. Mrs. Colbaugh is showing past due under [place].   [place] Management Direct Dial [phone]  Fax [phone] [email or web address]

## 2018-11-22 ENCOUNTER — Encounter: Payer: Self-pay | Admitting: Physician Assistant

## 2018-11-22 DIAGNOSIS — I6521 Occlusion and stenosis of right carotid artery: Secondary | ICD-10-CM

## 2018-11-22 HISTORY — DX: Occlusion and stenosis of right carotid artery: I65.21

## 2018-11-25 DIAGNOSIS — H468 Other optic neuritis: Secondary | ICD-10-CM | POA: Insufficient documentation

## 2018-11-25 MED ORDER — TRESIBA FLEXTOUCH 200 UNIT/ML ~~LOC~~ SOPN: 56.0000 [IU] | PEN_INJECTOR | SUBCUTANEOUS | 1 refills | Status: DC

## 2018-12-05 ENCOUNTER — Other Ambulatory Visit: Payer: Self-pay | Admitting: Physician Assistant

## 2018-12-05 DIAGNOSIS — IMO0002 Reserved for concepts with insufficient information to code with codable children: Secondary | ICD-10-CM

## 2018-12-05 DIAGNOSIS — E11319 Type 2 diabetes mellitus with unspecified diabetic retinopathy without macular edema: Secondary | ICD-10-CM

## 2018-12-09 ENCOUNTER — Encounter: Payer: Self-pay | Admitting: Physician Assistant

## 2018-12-13 ENCOUNTER — Other Ambulatory Visit: Payer: Self-pay | Admitting: Family Medicine

## 2018-12-13 DIAGNOSIS — E11319 Type 2 diabetes mellitus with unspecified diabetic retinopathy without macular edema: Secondary | ICD-10-CM

## 2018-12-13 DIAGNOSIS — IMO0002 Reserved for concepts with insufficient information to code with codable children: Secondary | ICD-10-CM

## 2018-12-13 DIAGNOSIS — E785 Hyperlipidemia, unspecified: Secondary | ICD-10-CM

## 2018-12-21 ENCOUNTER — Other Ambulatory Visit: Payer: Self-pay | Admitting: Physician Assistant

## 2018-12-21 DIAGNOSIS — E11319 Type 2 diabetes mellitus with unspecified diabetic retinopathy without macular edema: Secondary | ICD-10-CM

## 2018-12-21 DIAGNOSIS — IMO0002 Reserved for concepts with insufficient information to code with codable children: Secondary | ICD-10-CM

## 2019-02-01 ENCOUNTER — Other Ambulatory Visit: Payer: Self-pay | Admitting: Physician Assistant

## 2019-02-01 DIAGNOSIS — H468 Other optic neuritis: Secondary | ICD-10-CM

## 2019-02-08 ENCOUNTER — Other Ambulatory Visit: Payer: Self-pay | Admitting: Physician Assistant

## 2019-02-08 DIAGNOSIS — J3089 Other allergic rhinitis: Secondary | ICD-10-CM

## 2019-02-13 ENCOUNTER — Other Ambulatory Visit: Payer: Self-pay | Admitting: Physician Assistant

## 2019-02-13 DIAGNOSIS — I10 Essential (primary) hypertension: Secondary | ICD-10-CM

## 2019-02-13 DIAGNOSIS — E1159 Type 2 diabetes mellitus with other circulatory complications: Secondary | ICD-10-CM

## 2019-04-07 ENCOUNTER — Other Ambulatory Visit: Payer: Self-pay | Admitting: Physician Assistant

## 2019-04-07 DIAGNOSIS — J4531 Mild persistent asthma with (acute) exacerbation: Secondary | ICD-10-CM

## 2019-04-12 ENCOUNTER — Other Ambulatory Visit: Payer: Self-pay | Admitting: Physician Assistant

## 2019-04-12 DIAGNOSIS — IMO0002 Reserved for concepts with insufficient information to code with codable children: Secondary | ICD-10-CM

## 2019-04-12 DIAGNOSIS — J4531 Mild persistent asthma with (acute) exacerbation: Secondary | ICD-10-CM

## 2019-04-12 DIAGNOSIS — E11319 Type 2 diabetes mellitus with unspecified diabetic retinopathy without macular edema: Secondary | ICD-10-CM

## 2019-04-12 NOTE — Telephone Encounter (Signed)
Patient states that she needs her inhaler, and all of her medications. She will be out very soon. Please advise. She could not give me the names.

## 2019-04-13 MED ORDER — OZEMPIC (1 MG/DOSE) 2 MG/1.5ML ~~LOC~~ SOPN: 1.0000 mg | PEN_INJECTOR | SUBCUTANEOUS | 0 refills | Status: DC

## 2019-04-13 MED ORDER — METFORMIN HCL ER 500 MG PO TB24: 1000.0000 mg | ORAL_TABLET | Freq: Two times a day (BID) | ORAL | 0 refills | Status: DC

## 2019-04-13 MED ORDER — TRESIBA FLEXTOUCH 200 UNIT/ML ~~LOC~~ SOPN: 56.0000 [IU] | PEN_INJECTOR | SUBCUTANEOUS | 0 refills | Status: DC

## 2019-04-13 MED ORDER — ATROVENT HFA 17 MCG/ACT IN AERS: INHALATION_SPRAY | RESPIRATORY_TRACT | 0 refills | Status: DC

## 2019-04-13 MED ORDER — INSULIN LISPRO 100 UNIT/ML ~~LOC~~ SOLN: SUBCUTANEOUS | 0 refills | Status: DC

## 2019-04-13 NOTE — Telephone Encounter (Signed)
Patient needs to contact pharmacy for these refills... especially since she cannot give [place] specific names

## 2019-04-13 NOTE — Telephone Encounter (Signed)
Patient called back and I went through the medications and she gave me the ones she needed. I have sent 30 days to local pharmacy and she has follow up with Jade on 04/17/2019. No other questions at this time.

## 2019-04-13 NOTE — Telephone Encounter (Signed)
Patient states that per pharmacy she needed to review it with [place]. Patient wanted to speak to a nurse about this. Caller was transferred.

## 2019-04-13 NOTE — Addendum Note (Signed)
Addended by: Dessie Coma on: 04/13/2019 03:48 PM   Modules accepted: Orders

## 2019-04-17 ENCOUNTER — Ambulatory Visit (INDEPENDENT_AMBULATORY_CARE_PROVIDER_SITE_OTHER): Payer: Medicare Other | Admitting: Physician Assistant

## 2019-04-17 VITALS — BP 210/87 | HR 86 | Ht 62.0 in | Wt 223.0 lb

## 2019-04-17 DIAGNOSIS — I340 Nonrheumatic mitral (valve) insufficiency: Secondary | ICD-10-CM | POA: Diagnosis not present

## 2019-04-17 DIAGNOSIS — Z9119 Patient's noncompliance with other medical treatment and regimen: Secondary | ICD-10-CM

## 2019-04-17 DIAGNOSIS — E1169 Type 2 diabetes mellitus with other specified complication: Secondary | ICD-10-CM

## 2019-04-17 DIAGNOSIS — E1159 Type 2 diabetes mellitus with other circulatory complications: Secondary | ICD-10-CM | POA: Diagnosis not present

## 2019-04-17 DIAGNOSIS — E11319 Type 2 diabetes mellitus with unspecified diabetic retinopathy without macular edema: Secondary | ICD-10-CM | POA: Diagnosis not present

## 2019-04-17 DIAGNOSIS — Z794 Long term (current) use of insulin: Secondary | ICD-10-CM

## 2019-04-17 DIAGNOSIS — I6521 Occlusion and stenosis of right carotid artery: Secondary | ICD-10-CM | POA: Diagnosis not present

## 2019-04-17 DIAGNOSIS — I10 Essential (primary) hypertension: Secondary | ICD-10-CM | POA: Diagnosis not present

## 2019-04-17 DIAGNOSIS — IMO0002 Reserved for concepts with insufficient information to code with codable children: Secondary | ICD-10-CM

## 2019-04-17 DIAGNOSIS — Z13820 Encounter for screening for osteoporosis: Secondary | ICD-10-CM

## 2019-04-17 DIAGNOSIS — E1165 Type 2 diabetes mellitus with hyperglycemia: Secondary | ICD-10-CM

## 2019-04-17 DIAGNOSIS — E785 Hyperlipidemia, unspecified: Secondary | ICD-10-CM

## 2019-04-17 DIAGNOSIS — Z91199 Patient's noncompliance with other medical treatment and regimen due to unspecified reason: Secondary | ICD-10-CM

## 2019-04-17 MED ORDER — AMLODIPINE BESYLATE 10 MG PO TABS: 10.0000 mg | ORAL_TABLET | Freq: Every day | ORAL | 1 refills | Status: DC

## 2019-04-17 MED ORDER — CARVEDILOL 12.5 MG PO TABS: 12.5000 mg | ORAL_TABLET | Freq: Two times a day (BID) | ORAL | 1 refills | Status: DC

## 2019-04-17 MED ORDER — LISINOPRIL 40 MG PO TABS: 40.0000 mg | ORAL_TABLET | Freq: Every day | ORAL | 1 refills | Status: DC

## 2019-04-17 MED ORDER — INSULIN LISPRO 100 UNIT/ML ~~LOC~~ SOLN: SUBCUTANEOUS | 1 refills | Status: DC

## 2019-04-17 MED ORDER — METFORMIN HCL ER 500 MG PO TB24: 1000.0000 mg | ORAL_TABLET | Freq: Two times a day (BID) | ORAL | 1 refills | Status: DC

## 2019-04-17 MED ORDER — CHLORTHALIDONE 25 MG PO TABS: 25.0000 mg | ORAL_TABLET | Freq: Every day | ORAL | 1 refills | Status: DC

## 2019-04-17 MED ORDER — OZEMPIC (1 MG/DOSE) 2 MG/1.5ML ~~LOC~~ SOPN: 1.0000 mg | PEN_INJECTOR | SUBCUTANEOUS | 1 refills | Status: DC

## 2019-04-17 NOTE — Progress Notes (Signed)
Patient ID: Jamie Strickland, female   DOB: 1945-08-25, 73 y.o.   MRN: [id number] .Marland KitchenVirtual Visit via Telephone Note  I connected with Jamie Strickland on 04/19/19 at  2:20 PM EST by telephone and verified that I am speaking with the correct person using two identifiers.  Location: Patient: home Provider: clinic   I discussed the limitations, risks, security and privacy concerns of performing an evaluation and management service by telephone and the availability of in person appointments. I also discussed with the patient that there may be a patient responsible charge related to this service. The patient expressed understanding and agreed to proceed.   History of Present Illness: Pt is a 73 yo T2DM, HTN, asthma, HLD who calls into the clinic for 3 month refill.   Patient was previously seen by Rosita Kea who recently left the clinic.  Patient via telephone states her morning fasting sugars were 177 this morning.  She reports that she takes medication when she thinks about it but she needs a refill on everything.  She is not able to tell me medications that she is on.  Last visit with [name] her A1c was 9.6.  She was told to increase her Tresiba to 42 units.  Humalog was added to mealtime insulin.  Samples of Tresiba Ozempic were given.  Patient denies any chest pain, palpitations, headaches or vision changes.  Her blood pressure is very elevated today but she states she did not take her medicines today.  .. Active Ambulatory Problems    Diagnosis Date Noted  . Hyperlipidemia LDL goal <70 12/28/2007  . Allergic rhinitis 12/28/2007  . OSTEOARTHRITIS 12/28/2007  . COLONIC POLYPS, HX OF 12/28/2007  . Encounter for medication management 08/31/2011  . Uncontrolled type 2 diabetes mellitus with retinopathy, with long-term current use of insulin (HCC) 08/31/2011  . Obesity 02/29/2012  . Actinic keratosis 11/13/2013  . Mild nonproliferative diabetic retinopathy (HCC) 09/25/2014  .  Medically noncompliant 02/20/2015  . Stroke-like symptom 10/04/2016  . Hypertension associated with type 2 diabetes mellitus (HCC) 11/13/2016  . Hyperlipidemia associated with type 2 diabetes mellitus (HCC) 12/30/2016  . Mild peripheral edema 08/02/2017  . Dyspnea on exertion 08/02/2017  . History of echocardiogram 08/10/2017  . Mild mitral regurgitation 08/10/2017  . Mild persistent asthma without complication 12/10/2017  . Need for diphtheria-tetanus-pertussis (Tdap) vaccine 03/07/2018  . Chronic middle ear effusion, left 05/25/2018  . Uncontrolled stage 2 hypertension 08/24/2018  . At moderate risk for fall 09/16/2018  . Mild atherosclerosis of carotid artery, right 11/22/2018  . 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/25/2018   Resolved Ambulatory Problems    Diagnosis Date Noted  . Essential hypertension, benign 12/28/2007  . Mild intermittent allergic asthma without complication 12/28/2007  . VAGINITIS 04/11/2010  . BACK PAIN 04/11/2010  . Cellulitis of buttock, right 01/22/2012  . Strain of knee and leg, right 09/06/2012  . Acute otalgia, left 12/10/2017   Past Medical History:  Diagnosis Date  . Allergic rhinitis   . Diabetes mellitus type II   . Hx of colonic polyps   . Hyperlipidemia   . Hypertension   . Osteoarthritis     Observations/Objective: No acute distress.   .. Today's Vitals   04/17/19 1129  BP: (!) 210/87  Pulse: 86  SpO2: 97%  Weight: 223 lb (101.2 kg)  Height: 5\' 2"  (1.575 m)   Body mass index is 40.79 kg/m.   Assessment and Plan: Marland KitchenTylee was seen today for diabetes and hypertension.  Diagnoses and all orders for this visit:  Hypertension associated with type 2 diabetes mellitus (HCC) -     lisinopril (ZESTRIL) 40 MG tablet; Take 1 tablet (40 mg total) by mouth daily. -     COMPLETE METABOLIC PANEL WITH GFR -     chlorthalidone (HYGROTON) 25 MG tablet; Take 1 tablet (25 mg total) by mouth daily. -     carvedilol (COREG) 12.5 MG  tablet; Take 1 tablet (12.5 mg total) by mouth 2 (two) times daily with a meal. -     amLODipine (NORVASC) 10 MG tablet; Take 1 tablet (10 mg total) by mouth daily.  Uncontrolled stage 2 hypertension -     Hemoglobin A1c -     COMPLETE METABOLIC PANEL WITH GFR -     chlorthalidone (HYGROTON) 25 MG tablet; Take 1 tablet (25 mg total) by mouth daily.  Mild mitral regurgitation -     COMPLETE METABOLIC PANEL WITH GFR  Mild atherosclerosis of carotid artery, right  Uncontrolled type 2 diabetes mellitus with retinopathy, with long-term current use of insulin (HCC) -     COMPLETE METABOLIC PANEL WITH GFR -     Semaglutide, 1 MG/DOSE, (OZEMPIC, 1 MG/DOSE,) 2 MG/1.5ML SOPN; Inject 1 mg into the skin once a week. -     metFORMIN (GLUCOPHAGE-XR) 500 MG 24 hr tablet; Take 2 tablets (1,000 mg total) by mouth 2 (two) times daily with a meal. -     insulin lispro (HUMALOG) 100 UNIT/ML injection; INJECT 25 UNITS TOTAL INTO THE SKIN 3 (THREE) TIMES DAILY BEFORE MEALS.  Osteoporosis screening -     DG Bone Density  Medically noncompliant  Hyperlipidemia associated with type 2 diabetes mellitus ([place])   Discussed with patient I really need an A1c to characterize if her diabetes is controlled or not.  At this time I am not to make any changes but I will would like for her to continue her current medication regimen.  I sent labs downstairs for her to help drawn so that we could adjust medication as needed.  I am a little concerned she is not taking medication as directed therefore an increase could be dangerous.  Discussed the importance of blood pressure control.  With the blood pressure as high as her it could put her in danger of a stroke.  She must be compliant with medication and start keeping a blood pressure log.  Please keep a blood pressure log and follow-up in 2 weeks to make sure her blood pressure has normalized.  If not we will need to add more medication.    Follow Up Instructions:    I  discussed the assessment and treatment plan with the patient. The patient was provided an opportunity to ask questions and all were answered. The patient agreed with the plan and demonstrated an understanding of the instructions.   The patient was advised to call back or seek an in-person evaluation if the symptoms worsen or if the condition fails to improve as anticipated.  I provided 20 minutes of non-face-to-face time during this encounter.   Iran Planas, PA-C

## 2019-04-17 NOTE — Progress Notes (Deleted)
Blood sugar this morning (fasting): 177  She states she "takes medication when I think about it" blood pressure high, she reports no symptoms. States she needs refills on "everything". Does not know her medications.

## 2019-04-19 ENCOUNTER — Encounter: Payer: Self-pay | Admitting: Physician Assistant

## 2019-04-21 ENCOUNTER — Telehealth: Payer: Self-pay | Admitting: Neurology

## 2019-04-21 DIAGNOSIS — Z1231 Encounter for screening mammogram for malignant neoplasm of breast: Secondary | ICD-10-CM

## 2019-04-21 NOTE — Telephone Encounter (Signed)
Patient called and asked for [place] to put in mgm order for her while she has to come for labs. Order entered. Patient made aware.

## 2019-04-25 ENCOUNTER — Other Ambulatory Visit: Payer: Self-pay | Admitting: Physician Assistant

## 2019-04-25 DIAGNOSIS — E11319 Type 2 diabetes mellitus with unspecified diabetic retinopathy without macular edema: Secondary | ICD-10-CM

## 2019-04-25 DIAGNOSIS — IMO0002 Reserved for concepts with insufficient information to code with codable children: Secondary | ICD-10-CM

## 2019-04-26 ENCOUNTER — Other Ambulatory Visit: Payer: Self-pay | Admitting: Physician Assistant

## 2019-04-26 DIAGNOSIS — E11319 Type 2 diabetes mellitus with unspecified diabetic retinopathy without macular edema: Secondary | ICD-10-CM

## 2019-04-26 DIAGNOSIS — IMO0002 Reserved for concepts with insufficient information to code with codable children: Secondary | ICD-10-CM

## 2019-04-28 ENCOUNTER — Other Ambulatory Visit: Payer: Self-pay | Admitting: Physician Assistant

## 2019-04-28 DIAGNOSIS — H468 Other optic neuritis: Secondary | ICD-10-CM

## 2019-04-29 ENCOUNTER — Other Ambulatory Visit: Payer: Self-pay | Admitting: Physician Assistant

## 2019-04-29 DIAGNOSIS — IMO0002 Reserved for concepts with insufficient information to code with codable children: Secondary | ICD-10-CM

## 2019-04-29 DIAGNOSIS — E11319 Type 2 diabetes mellitus with unspecified diabetic retinopathy without macular edema: Secondary | ICD-10-CM

## 2019-05-03 ENCOUNTER — Ambulatory Visit: Payer: Medicare Other

## 2019-05-10 ENCOUNTER — Other Ambulatory Visit: Payer: Self-pay | Admitting: Family Medicine

## 2019-05-10 DIAGNOSIS — J3089 Other allergic rhinitis: Secondary | ICD-10-CM

## 2019-05-11 ENCOUNTER — Other Ambulatory Visit: Payer: Self-pay | Admitting: Physician Assistant

## 2019-05-11 ENCOUNTER — Other Ambulatory Visit: Payer: Self-pay | Admitting: Family Medicine

## 2019-05-11 DIAGNOSIS — I10 Essential (primary) hypertension: Secondary | ICD-10-CM

## 2019-05-11 DIAGNOSIS — IMO0002 Reserved for concepts with insufficient information to code with codable children: Secondary | ICD-10-CM

## 2019-05-11 DIAGNOSIS — E1159 Type 2 diabetes mellitus with other circulatory complications: Secondary | ICD-10-CM

## 2019-05-11 DIAGNOSIS — E11319 Type 2 diabetes mellitus with unspecified diabetic retinopathy without macular edema: Secondary | ICD-10-CM

## 2019-05-17 ENCOUNTER — Ambulatory Visit: Payer: Medicare Other

## 2019-05-18 ENCOUNTER — Ambulatory Visit: Payer: Medicare Other

## 2019-05-21 ENCOUNTER — Other Ambulatory Visit: Payer: Self-pay | Admitting: Osteopathic Medicine

## 2019-05-21 DIAGNOSIS — J4531 Mild persistent asthma with (acute) exacerbation: Secondary | ICD-10-CM

## 2019-05-24 ENCOUNTER — Other Ambulatory Visit: Payer: Self-pay | Admitting: Physician Assistant

## 2019-05-24 DIAGNOSIS — E1159 Type 2 diabetes mellitus with other circulatory complications: Secondary | ICD-10-CM

## 2019-05-24 DIAGNOSIS — I152 Hypertension secondary to endocrine disorders: Secondary | ICD-10-CM

## 2019-05-25 ENCOUNTER — Other Ambulatory Visit: Payer: Self-pay

## 2019-05-25 ENCOUNTER — Ambulatory Visit (INDEPENDENT_AMBULATORY_CARE_PROVIDER_SITE_OTHER): Payer: Medicare Other

## 2019-05-25 DIAGNOSIS — I10 Essential (primary) hypertension: Secondary | ICD-10-CM | POA: Diagnosis not present

## 2019-05-25 DIAGNOSIS — Z794 Long term (current) use of insulin: Secondary | ICD-10-CM | POA: Diagnosis not present

## 2019-05-25 DIAGNOSIS — E1159 Type 2 diabetes mellitus with other circulatory complications: Secondary | ICD-10-CM | POA: Diagnosis not present

## 2019-05-25 DIAGNOSIS — E11319 Type 2 diabetes mellitus with unspecified diabetic retinopathy without macular edema: Secondary | ICD-10-CM | POA: Diagnosis not present

## 2019-05-25 DIAGNOSIS — Z1231 Encounter for screening mammogram for malignant neoplasm of breast: Secondary | ICD-10-CM | POA: Diagnosis not present

## 2019-05-25 DIAGNOSIS — E1165 Type 2 diabetes mellitus with hyperglycemia: Secondary | ICD-10-CM | POA: Diagnosis not present

## 2019-05-25 DIAGNOSIS — R6889 Other general symptoms and signs: Secondary | ICD-10-CM | POA: Diagnosis not present

## 2019-05-26 LAB — COMPLETE METABOLIC PANEL WITH GFR
AG Ratio: 1.4 (calc) (ref 1.0–2.5)
ALT: 33 U/L — ABNORMAL HIGH (ref 6–29)
AST: 23 U/L (ref 10–35)
Albumin: 3.9 g/dL (ref 3.6–5.1)
Alkaline phosphatase (APISO): 81 U/L (ref 37–153)
BUN/Creatinine Ratio: 35 (calc) — ABNORMAL HIGH (ref 6–22)
BUN: 33 mg/dL — ABNORMAL HIGH (ref 7–25)
CO2: 29 mmol/L (ref 20–32)
Calcium: 9.3 mg/dL (ref 8.6–10.4)
Chloride: 98 mmol/L (ref 98–110)
Creat: 0.93 mg/dL (ref 0.60–0.93)
GFR, Est African American: 72 mL/min/{1.73_m2} (ref >=60)
GFR, Est Non African American: 62 mL/min/{1.73_m2} (ref >=60)
Globulin: 2.7 g/dL (calc) (ref 1.9–3.7)
Glucose, Bld: 305 mg/dL — ABNORMAL HIGH (ref 65–99)
Potassium: 4.6 mmol/L (ref 3.5–5.3)
Sodium: 141 mmol/L (ref 135–146)
Total Bilirubin: 0.2 mg/dL (ref 0.2–1.2)
Total Protein: 6.6 g/dL (ref 6.1–8.1)

## 2019-05-26 LAB — HEMOGLOBIN A1C
Hgb A1c MFr Bld: 10.4 % of total Hgb — ABNORMAL HIGH (ref <5.7)
Mean Plasma Glucose: 252 (calc)
eAG (mmol/L): 13.9 (calc)

## 2019-05-26 NOTE — Progress Notes (Signed)
We need appt to talk about sugar now. A!C up to 10.4. that is very elevated and we must get a plan to get these numbers back down.

## 2019-05-28 ENCOUNTER — Other Ambulatory Visit: Payer: Self-pay | Admitting: Osteopathic Medicine

## 2019-05-28 DIAGNOSIS — E11319 Type 2 diabetes mellitus with unspecified diabetic retinopathy without macular edema: Secondary | ICD-10-CM

## 2019-05-28 DIAGNOSIS — IMO0002 Reserved for concepts with insufficient information to code with codable children: Secondary | ICD-10-CM

## 2019-05-28 NOTE — Progress Notes (Signed)
Call pt: normal mammogram. Follow up in 1 year.

## 2019-06-07 ENCOUNTER — Encounter: Payer: Self-pay | Admitting: Physician Assistant

## 2019-06-07 ENCOUNTER — Ambulatory Visit (INDEPENDENT_AMBULATORY_CARE_PROVIDER_SITE_OTHER): Payer: Medicare Other | Admitting: Physician Assistant

## 2019-06-07 ENCOUNTER — Other Ambulatory Visit: Payer: Self-pay | Admitting: Physician Assistant

## 2019-06-07 ENCOUNTER — Other Ambulatory Visit: Payer: Self-pay

## 2019-06-07 ENCOUNTER — Ambulatory Visit (INDEPENDENT_AMBULATORY_CARE_PROVIDER_SITE_OTHER): Payer: Medicare Other

## 2019-06-07 VITALS — BP 160/86 | HR 88 | Ht 63.0 in | Wt 223.0 lb

## 2019-06-07 DIAGNOSIS — E11319 Type 2 diabetes mellitus with unspecified diabetic retinopathy without macular edema: Secondary | ICD-10-CM

## 2019-06-07 DIAGNOSIS — R0602 Shortness of breath: Secondary | ICD-10-CM | POA: Diagnosis not present

## 2019-06-07 DIAGNOSIS — Z794 Long term (current) use of insulin: Secondary | ICD-10-CM | POA: Diagnosis not present

## 2019-06-07 DIAGNOSIS — E1165 Type 2 diabetes mellitus with hyperglycemia: Secondary | ICD-10-CM

## 2019-06-07 DIAGNOSIS — E1159 Type 2 diabetes mellitus with other circulatory complications: Secondary | ICD-10-CM

## 2019-06-07 DIAGNOSIS — J4531 Mild persistent asthma with (acute) exacerbation: Secondary | ICD-10-CM

## 2019-06-07 DIAGNOSIS — R05 Cough: Secondary | ICD-10-CM | POA: Diagnosis not present

## 2019-06-07 DIAGNOSIS — IMO0002 Reserved for concepts with insufficient information to code with codable children: Secondary | ICD-10-CM

## 2019-06-07 DIAGNOSIS — I152 Hypertension secondary to endocrine disorders: Secondary | ICD-10-CM

## 2019-06-07 DIAGNOSIS — I10 Essential (primary) hypertension: Secondary | ICD-10-CM

## 2019-06-07 DIAGNOSIS — R6889 Other general symptoms and signs: Secondary | ICD-10-CM | POA: Diagnosis not present

## 2019-06-07 MED ORDER — TRULICITY 1.5 MG/0.5ML ~~LOC~~ SOAJ: 1.5000 mg | SUBCUTANEOUS | 2 refills | Status: DC

## 2019-06-07 MED ORDER — ALBUTEROL SULFATE (5 MG/ML) 0.5% IN NEBU: 2.5000 mg | INHALATION_SOLUTION | RESPIRATORY_TRACT | 5 refills | Status: DC | PRN

## 2019-06-07 MED ORDER — FLUTICASONE PROPIONATE 50 MCG/ACT NA SUSP: 2.0000 | Freq: Every day | NASAL | 6 refills | Status: DC

## 2019-06-07 MED ORDER — ALBUTEROL SULFATE HFA 108 (90 BASE) MCG/ACT IN AERS: INHALATION_SPRAY | RESPIRATORY_TRACT | 0 refills | Status: AC

## 2019-06-07 MED ORDER — ALBUTEROL SULFATE HFA 108 (90 BASE) MCG/ACT IN AERS
2.0000 | INHALATION_SPRAY | Freq: Once | RESPIRATORY_TRACT | Status: AC
  Administered 2019-06-07: 14:00:00 2 via RESPIRATORY_TRACT

## 2019-06-07 MED ORDER — METHYLPREDNISOLONE SODIUM SUCC 125 MG IJ SOLR
125.0000 mg | Freq: Once | INTRAMUSCULAR | Status: AC
  Administered 2019-06-07: 14:00:00 125 mg via INTRAMUSCULAR

## 2019-06-07 MED ORDER — PREDNISONE 20 MG PO TABS: ORAL_TABLET | ORAL | 0 refills | Status: DC

## 2019-06-07 NOTE — Progress Notes (Signed)
Call pt: inflammation of lungs but no infection. Prednisone should help a lot in combination with albuterol given.

## 2019-06-07 NOTE — Telephone Encounter (Signed)
Please advise if change ok

## 2019-06-07 NOTE — Progress Notes (Signed)
Subjective:    Patient ID: Jamie Strickland, female    DOB: 1945/11/04, 74 y.o.   MRN: [id number]  HPI  Pt is a 74 yo female with uncontrolled diabetes, uncontrolled HTN, and asthma who is not always compliant with medications. She was brought in to dicsuss elevated A1C that was above 10.   Pt is most concerned with her SOB and wheezing since primatine was taken off the shelf. She admits she was taking in every 6-8 hours. She has a lot of allergies and asthma and has to use both. She feels like difficult to breath today. She does not have rescue inhaler due to cost.   Pt is not checking her sugars. She is taking metformin most of the time and tresiba however not using ozempic due to GI side effects. She is using 58 units of tresiba every morning and 25 at each meal.   .. Active Ambulatory Problems    Diagnosis Date Noted  . Hyperlipidemia LDL goal <70 12/28/2007  . Allergic rhinitis 12/28/2007  . OSTEOARTHRITIS 12/28/2007  . COLONIC POLYPS, HX OF 12/28/2007  . Encounter for medication management 08/31/2011  . Uncontrolled type 2 diabetes mellitus with retinopathy, with long-term current use of insulin (HCC) 08/31/2011  . Obesity 02/29/2012  . Actinic keratosis 11/13/2013  . Mild nonproliferative diabetic retinopathy (HCC) 09/25/2014  . Medically noncompliant 02/20/2015  . Stroke-like symptom 10/04/2016  . Hypertension associated with type 2 diabetes mellitus (HCC) 11/13/2016  . Hyperlipidemia associated with type 2 diabetes mellitus (HCC) 12/30/2016  . Mild peripheral edema 08/02/2017  . Dyspnea on exertion 08/02/2017  . History of echocardiogram 08/10/2017  . Mild mitral regurgitation 08/10/2017  . Mild persistent asthma without complication 12/10/2017  . Need for diphtheria-tetanus-pertussis (Tdap) vaccine 03/07/2018  . Chronic middle ear effusion, left 05/25/2018  . Uncontrolled stage 2 hypertension 08/24/2018  . At moderate risk for fall 09/16/2018  . Mild atherosclerosis of  carotid artery, right 11/22/2018  . 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/25/2018   Resolved Ambulatory Problems    Diagnosis Date Noted  . Essential hypertension, benign 12/28/2007  . Mild intermittent allergic asthma without complication 12/28/2007  . VAGINITIS 04/11/2010  . BACK PAIN 04/11/2010  . Cellulitis of buttock, right 01/22/2012  . Strain of knee and leg, right 09/06/2012  . Acute otalgia, left 12/10/2017   Past Medical History:  Diagnosis Date  . Allergic rhinitis   . Diabetes mellitus type II   . Hx of colonic polyps   . Hyperlipidemia   . Hypertension   . Osteoarthritis      Review of Systems See HPI.     Objective:   Physical Exam        Assessment & Plan:  Marland KitchenMarland KitchenAriannie was seen today for diabetes.  Diagnoses and all orders for this visit:  SOB (shortness of breath) -     DG Chest 2 View -     predniSONE (DELTASONE) 20 MG tablet; Take 3 tablets for 3 days, take 2 tablets for 3 days, take 1 tablet for 3 days take 1/2 tablet for 4 days.  Uncontrolled type 2 diabetes mellitus with retinopathy, with long-term current use of insulin (HCC) -     Dulaglutide (TRULICITY) 1.5 MG/0.5ML SOPN; Inject 1.5 mg into the skin once a week. -     Ambulatory referral to Endocrinology  Mild persistent asthma with acute exacerbation -     DG Chest 2 View -     predniSONE (DELTASONE) 20 MG tablet;  Take 3 tablets for 3 days, take 2 tablets for 3 days, take 1 tablet for 3 days take 1/2 tablet for 4 days. -     albuterol (PROAIR HFA) 108 (90 Base) MCG/ACT inhaler; INHALE 1-2 PUFFS INTO THE LUNGS EVERY 4 (FOUR) HOURS AS NEEDED FOR WHEEZING OR SHORTNESS OF BREATH -     Discontinue: albuterol (PROVENTIL) (5 MG/ML) 0.5% nebulizer solution; Take 0.5 mLs (2.5 mg total) by nebulization every 4 (four) hours as needed for wheezing or shortness of breath. -     albuterol (VENTOLIN HFA) 108 (90 Base) MCG/ACT inhaler 2 puff -     methylPREDNISolone sodium succinate (SOLU-MEDROL) 125 mg/2  mL injection 125 mg  Hypertension associated with type 2 diabetes mellitus (HCC)  Other orders -     fluticasone (FLONASE) 50 MCG/ACT nasal spray; Place 2 sprays into both nostrils daily.   A1C not stable.  40 units tresiba in morning and 15 units at night. May titrate by 3 units every 5 days until morning sugars under 130 and eventing under 180.  Continue humalog.  Continue metformin.  Stop ozempic due to side effects. Start trulicity weekly.  Discussed diet and to limit carbs and sugars.  Encouraged 10 minutes of walking a day.  BP not to goal. On ACE.  On STATIN.  Referral to endocrinology.   BP not to goal. Recheck in 2 weeks.   SOB-airway is really tight and decrease breath sounds. Albuterol puff in office lungs opened up some. Use as needed.  Given solumedrol for exacerbation. Albuterol inhaler given to use as home. Start prednisone and flonase. No signs of bacterial infection follow up as needed. I do think she was over using decongestant and perhaps BP is elevated due rebound HTN. Follow up 2 weeks.

## 2019-06-07 NOTE — Patient Instructions (Addendum)
Split tresibia 40 units morning and 15 units at night.   Continue humalog 25 units at every meal.  Continue metformin. Stop ozempic and start trulicity weekly.   Watch sugars and carbs. These will cause your sugars to rise.   Referral endocrinology.   mucinex without D. D is going to cause your blood pressure to increase to a dangerous level.

## 2019-06-07 NOTE — Telephone Encounter (Signed)
I just sent

## 2019-06-09 ENCOUNTER — Other Ambulatory Visit: Payer: Self-pay | Admitting: Physician Assistant

## 2019-06-09 DIAGNOSIS — J4531 Mild persistent asthma with (acute) exacerbation: Secondary | ICD-10-CM

## 2019-06-12 ENCOUNTER — Encounter: Payer: Self-pay | Admitting: Physician Assistant

## 2019-06-14 ENCOUNTER — Other Ambulatory Visit: Payer: Self-pay | Admitting: Osteopathic Medicine

## 2019-06-14 DIAGNOSIS — E11319 Type 2 diabetes mellitus with unspecified diabetic retinopathy without macular edema: Secondary | ICD-10-CM

## 2019-06-14 DIAGNOSIS — IMO0002 Reserved for concepts with insufficient information to code with codable children: Secondary | ICD-10-CM

## 2019-06-14 NOTE — Telephone Encounter (Signed)
Forwarding medication refill request to the clinical pool for review. 

## 2019-06-15 ENCOUNTER — Other Ambulatory Visit: Payer: Self-pay | Admitting: Osteopathic Medicine

## 2019-06-15 DIAGNOSIS — E11319 Type 2 diabetes mellitus with unspecified diabetic retinopathy without macular edema: Secondary | ICD-10-CM

## 2019-06-15 DIAGNOSIS — IMO0002 Reserved for concepts with insufficient information to code with codable children: Secondary | ICD-10-CM

## 2019-07-05 ENCOUNTER — Encounter: Payer: Self-pay | Admitting: Physician Assistant

## 2019-07-05 ENCOUNTER — Other Ambulatory Visit: Payer: Self-pay | Admitting: Physician Assistant

## 2019-07-05 ENCOUNTER — Ambulatory Visit (INDEPENDENT_AMBULATORY_CARE_PROVIDER_SITE_OTHER): Payer: Medicare Other | Admitting: Physician Assistant

## 2019-07-05 VITALS — BP 184/61 | HR 82

## 2019-07-05 DIAGNOSIS — R600 Localized edema: Secondary | ICD-10-CM

## 2019-07-05 DIAGNOSIS — R635 Abnormal weight gain: Secondary | ICD-10-CM

## 2019-07-05 DIAGNOSIS — I10 Essential (primary) hypertension: Secondary | ICD-10-CM

## 2019-07-05 DIAGNOSIS — Z20822 Contact with and (suspected) exposure to covid-19: Secondary | ICD-10-CM | POA: Diagnosis not present

## 2019-07-05 DIAGNOSIS — R059 Cough, unspecified: Secondary | ICD-10-CM

## 2019-07-05 DIAGNOSIS — D649 Anemia, unspecified: Secondary | ICD-10-CM

## 2019-07-05 DIAGNOSIS — E11319 Type 2 diabetes mellitus with unspecified diabetic retinopathy without macular edema: Secondary | ICD-10-CM

## 2019-07-05 DIAGNOSIS — IMO0002 Reserved for concepts with insufficient information to code with codable children: Secondary | ICD-10-CM

## 2019-07-05 DIAGNOSIS — K921 Melena: Secondary | ICD-10-CM

## 2019-07-05 DIAGNOSIS — I152 Hypertension secondary to endocrine disorders: Secondary | ICD-10-CM

## 2019-07-05 DIAGNOSIS — J029 Acute pharyngitis, unspecified: Secondary | ICD-10-CM

## 2019-07-05 DIAGNOSIS — J4541 Moderate persistent asthma with (acute) exacerbation: Secondary | ICD-10-CM

## 2019-07-05 DIAGNOSIS — R05 Cough: Secondary | ICD-10-CM | POA: Diagnosis not present

## 2019-07-05 DIAGNOSIS — E1159 Type 2 diabetes mellitus with other circulatory complications: Secondary | ICD-10-CM

## 2019-07-05 DIAGNOSIS — R1084 Generalized abdominal pain: Secondary | ICD-10-CM

## 2019-07-05 DIAGNOSIS — R0602 Shortness of breath: Secondary | ICD-10-CM

## 2019-07-05 DIAGNOSIS — R6889 Other general symptoms and signs: Secondary | ICD-10-CM | POA: Diagnosis not present

## 2019-07-05 MED ORDER — PREDNISONE 20 MG PO TABS: ORAL_TABLET | ORAL | 0 refills | Status: DC

## 2019-07-05 MED ORDER — CARVEDILOL 12.5 MG PO TABS: 12.5000 mg | ORAL_TABLET | Freq: Two times a day (BID) | ORAL | 1 refills | Status: DC

## 2019-07-05 MED ORDER — AZITHROMYCIN 250 MG PO TABS: ORAL_TABLET | ORAL | 0 refills | Status: DC

## 2019-07-05 MED ORDER — FUROSEMIDE 20 MG PO TABS: 20.0000 mg | ORAL_TABLET | Freq: Every day | ORAL | 1 refills | Status: DC

## 2019-07-05 NOTE — Patient Instructions (Addendum)
Will get Echo to recheck heart.  Start prednisone and antibiotic.  Start lasix 20mg  daily.   Recheck in 10 days.

## 2019-07-05 NOTE — Progress Notes (Addendum)
Subjective:    Patient ID: Jamie Strickland, female    DOB: 1946-03-23, 74 y.o.   MRN: [id number]  HPI  Pt is a 74 yo female with HTN, T2DM(uncontrolled), asthma, HLD who presents to the clinic for worsening cough and SOB.   She reports she is taking her medication better.she is not checking her BP or sugars.   She did get some better with prednisone at last visit but continues to worsen now. CXR done at last visit showed bronchitic changes. For the last 6 days she has started swelling more in her ankles. She is weighing at home and gained 8lbs in 6 days. Nebulizer is not helping as much as it has in the past. She has pulse ox at home and will drop to mid 80s after a coughing episode. She did have a low grade fever a few days ago. Her son has URI symptoms and tested negative for covid. She endorses a ST and productive cough. She is finding it difficult to take even a few steps without being out of breath.   Her last echo was 2019 no significant findings.   .. Active Ambulatory Problems    Diagnosis Date Noted  . Hyperlipidemia LDL goal <70 12/28/2007  . Allergic rhinitis 12/28/2007  . OSTEOARTHRITIS 12/28/2007  . COLONIC POLYPS, HX OF 12/28/2007  . Encounter for medication management 08/31/2011  . Uncontrolled type 2 diabetes mellitus with retinopathy, with long-term current use of insulin ([place]) 08/31/2011  . Obesity 02/29/2012  . Actinic keratosis 11/13/2013  . Mild nonproliferative diabetic retinopathy ([place]) 09/25/2014  . Medically noncompliant 02/20/2015  . Stroke-like symptom 10/04/2016  . Hypertension associated with type 2 diabetes mellitus ([place]) 11/13/2016  . Hyperlipidemia associated with type 2 diabetes mellitus ([place]) 12/30/2016  . Bilateral lower extremity edema 08/02/2017  . Dyspnea on exertion 08/02/2017  . History of echocardiogram 08/10/2017  . Mild mitral regurgitation 08/10/2017  . Mild persistent asthma without complication 82/99/3716  . Need for  diphtheria-tetanus-pertussis (Tdap) vaccine 03/07/2018  . Chronic middle ear effusion, left 05/25/2018  . Uncontrolled stage 2 hypertension 08/24/2018  . At moderate risk for fall 09/16/2018  . Mild atherosclerosis of carotid artery, right 11/22/2018  . 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/25/2018  . Shortness of breath 07/05/2019  . Anemia 07/07/2019  . Generalized abdominal pain 07/07/2019  . Black tarry stools 07/07/2019   Resolved Ambulatory Problems    Diagnosis Date Noted  . Essential hypertension, benign 12/28/2007  . Mild intermittent allergic asthma without complication 96/78/9381  . VAGINITIS 04/11/2010  . BACK PAIN 04/11/2010  . Cellulitis of buttock, right 01/22/2012  . Strain of knee and leg, right 09/06/2012  . Acute otalgia, left 12/10/2017   Past Medical History:  Diagnosis Date  . Allergic rhinitis   . Diabetes mellitus type II   . Hx of colonic polyps   . Hyperlipidemia   . Hypertension   . Osteoarthritis      Review of Systems See HPI.     Objective:   Physical Exam Vitals reviewed.  Constitutional:      Comments: Weak and short labored breathing.   HENT:     Head: Normocephalic.     Right Ear: Tympanic membrane normal.     Left Ear: Tympanic membrane normal.     Nose: Nose normal.     Mouth/Throat:     Mouth: Mucous membranes are moist.     Pharynx: Posterior oropharyngeal erythema present.  Eyes:     Conjunctiva/sclera:  Conjunctivae normal.     Pupils: Pupils are equal, round, and reactive to light.  Cardiovascular:     Rate and Rhythm: Normal rate and regular rhythm.  Pulmonary:     Comments: Short labored breathing. Expiratory wheezing heard on exam. No rhonchi.  Abdominal:     General: There is no distension.     Tenderness: There is no abdominal tenderness. There is no right CVA tenderness or left CVA tenderness.  Musculoskeletal:     Cervical back: Normal range of motion.     Right lower leg: Edema present.     Left lower leg:  Edema present.     Comments: Bilateral 1+pitting edema of feet and ankles up to mid calf. No redness or warmth.   Lymphadenopathy:     Cervical: No cervical adenopathy.  Neurological:     General: No focal deficit present.     Mental Status: She is alert and oriented to person, place, and time.  Psychiatric:        Mood and Affect: Mood normal.           Assessment & Plan:  Marland KitchenMarland KitchenTaiylor was seen today for follow-up.  Diagnoses and all orders for this visit:  Shortness of breath -     furosemide (LASIX) 20 MG tablet; Take 1 tablet (20 mg total) by mouth daily. -     Brain natriuretic peptide -     COMPLETE METABOLIC PANEL WITH GFR -     CBC with Differential/Platelet -     ECHOCARDIOGRAM COMPLETE  Hypertension associated with type 2 diabetes mellitus (HCC) -     carvedilol (COREG) 12.5 MG tablet; Take 1 tablet (12.5 mg total) by mouth 2 (two) times daily with a meal. -     COMPLETE METABOLIC PANEL WITH GFR -     ECHOCARDIOGRAM COMPLETE  Bilateral lower extremity edema -     furosemide (LASIX) 20 MG tablet; Take 1 tablet (20 mg total) by mouth daily. -     Brain natriuretic peptide -     COMPLETE METABOLIC PANEL WITH GFR -     ECHOCARDIOGRAM COMPLETE  Cough -     Novel Coronavirus, NAA ([facility])  Suspected COVID-19 virus infection -     Novel Coronavirus, NAA ([facility]) -     Specimen status report  Sore throat -     Novel Coronavirus, NAA ([facility])  Moderate persistent asthmatic bronchitis with acute exacerbation -     azithromycin (ZITHROMAX) 250 MG tablet; Take 2 tablets now and then one tablet for 4 days. -     predniSONE (DELTASONE) 20 MG tablet; Take 3 tablets for 3 days, take 2 tablets for 3 days, take 1 tablet for 3 days, take 1/2 tablet for 4 days.  Weight gain -     ECHOCARDIOGRAM COMPLETE  Anemia, unspecified type -     Ambulatory referral to Gastroenterology -     Ambulatory referral to Hematology / Oncology  Black tarry stools  Generalized abdominal  pain  Pt does have symptoms of Covid. Nasal swab done in office.   Reassured pulse ox without O2 staying 94 percent.   Concerned about weight gain, edema, SOB. Will get echo and chest CT however we have to wait until covid results are back. Concern for asthmatic bronchitis vs acute CHF vs acute respiratory failure. If negative will proceed with work up. Start with abx, prednisone, lasix. She is on BB and ACE. Follow up in 3 days. Will check CBC, CMP, BNP  today in office. AVOID salt.   Attempted to do 6 minute walk test to see if would qualify for O2. Was out of breath but pulse ox did not go below 92 percent. Pt aware if O2 drops below 88 percent go to ED. Follow up in 7-10 days for follow up. Continue to weigh. If swelling and weight going up or harder to breath call office.   Spent 45 minutes with patient.   Addendum: hgb dropped to 7.8. last checked 1 year at 13.9.  Stat referral to hematology and gastroenterology.  Ordered echo.  covid confirmed negative.  When called confirmed some generalized abdominal pain and black tarry stools.

## 2019-07-06 LAB — NOVEL CORONAVIRUS, NAA: SARS-CoV-2, NAA: NOT DETECTED

## 2019-07-06 LAB — SPECIMEN STATUS REPORT

## 2019-07-07 DIAGNOSIS — R1084 Generalized abdominal pain: Secondary | ICD-10-CM | POA: Insufficient documentation

## 2019-07-07 DIAGNOSIS — K921 Melena: Secondary | ICD-10-CM | POA: Insufficient documentation

## 2019-07-07 DIAGNOSIS — D649 Anemia, unspecified: Secondary | ICD-10-CM | POA: Insufficient documentation

## 2019-07-07 LAB — CBC WITH DIFFERENTIAL/PLATELET
Absolute Monocytes: 642 {cells}/uL (ref 200–950)
Basophils Absolute: 47 {cells}/uL (ref 0–200)
Basophils Relative: 0.5 %
Eosinophils Absolute: 288 {cells}/uL (ref 15–500)
Eosinophils Relative: 3.1 %
HCT: 27.6 % — ABNORMAL LOW (ref 35.0–45.0)
Hemoglobin: 7.8 g/dL — ABNORMAL LOW (ref 11.7–15.5)
Lymphs Abs: 2139 {cells}/uL (ref 850–3900)
MCH: 20.6 pg — ABNORMAL LOW (ref 27.0–33.0)
MCHC: 28.3 g/dL — ABNORMAL LOW (ref 32.0–36.0)
MCV: 73 fL — ABNORMAL LOW (ref 80.0–100.0)
MPV: 9 fL (ref 7.5–12.5)
Monocytes Relative: 6.9 %
Neutro Abs: 6185 {cells}/uL (ref 1500–7800)
Neutrophils Relative %: 66.5 %
Platelets: 530 Thousand/uL — ABNORMAL HIGH (ref 140–400)
RBC: 3.78 Million/uL — ABNORMAL LOW (ref 3.80–5.10)
RDW: 17.1 % — ABNORMAL HIGH (ref 11.0–15.0)
Total Lymphocyte: 23 %
WBC: 9.3 Thousand/uL (ref 3.8–10.8)

## 2019-07-07 LAB — COMPLETE METABOLIC PANEL WITHOUT GFR
AG Ratio: 1.1 (calc) (ref 1.0–2.5)
ALT: 19 U/L (ref 6–29)
AST: 18 U/L (ref 10–35)
Albumin: 3.7 g/dL (ref 3.6–5.1)
Alkaline phosphatase (APISO): 84 U/L (ref 37–153)
BUN: 21 mg/dL (ref 7–25)
CO2: 26 mmol/L (ref 20–32)
Calcium: 9.8 mg/dL (ref 8.6–10.4)
Chloride: 104 mmol/L (ref 98–110)
Creat: 0.86 mg/dL (ref 0.60–0.93)
GFR, Est African American: 78 mL/min/1.73m2 (ref >=60)
GFR, Est Non African American: 67 mL/min/1.73m2 (ref >=60)
Globulin: 3.3 g/dL (ref 1.9–3.7)
Glucose, Bld: 228 mg/dL — ABNORMAL HIGH (ref 65–99)
Potassium: 5 mmol/L (ref 3.5–5.3)
Sodium: 140 mmol/L (ref 135–146)
Total Bilirubin: 0.4 mg/dL (ref 0.2–1.2)
Total Protein: 7 g/dL (ref 6.1–8.1)

## 2019-07-07 LAB — BRAIN NATRIURETIC PEPTIDE

## 2019-07-07 NOTE — Progress Notes (Signed)
Please confirmed when STAT referral appts have been made and scheduled.

## 2019-07-07 NOTE — Progress Notes (Signed)
Fyi 2 stat referrals placed for GI/hematology. We are having trouble reaching her if you do reach her let Lesly Rubenstein talk to her.

## 2019-07-07 NOTE — Addendum Note (Signed)
Addended byJomarie Longs on: 07/07/2019 09:00 AM   Modules accepted: Orders

## 2019-07-07 NOTE — Progress Notes (Signed)
Call pt:  Kidney, liver stable.  Glucose up but better than 1 month ago.  Negative for covid.   Your hemoglobin is very low at 7.8 and down from baseline. I think you could be losing blood somewhere. Are you having any abdominal pain, blood in stool, reflux, chest pain, indigestion?   I am sending STAT referral to hematology to likely have blood transfusion and gastroenterology to evaluate for blood loss. If you feel at any time you are worsening GO to ED. If you feel cannot make it to these appts. GO to ED. I do not know how fast you are losing blood due not being checked in a year.   Echo was also ordered.   How is breathing and swelling?

## 2019-07-10 ENCOUNTER — Other Ambulatory Visit: Payer: Self-pay | Admitting: Physician Assistant

## 2019-07-10 DIAGNOSIS — E1159 Type 2 diabetes mellitus with other circulatory complications: Secondary | ICD-10-CM

## 2019-07-10 DIAGNOSIS — E11319 Type 2 diabetes mellitus with unspecified diabetic retinopathy without macular edema: Secondary | ICD-10-CM

## 2019-07-10 DIAGNOSIS — IMO0002 Reserved for concepts with insufficient information to code with codable children: Secondary | ICD-10-CM

## 2019-07-10 NOTE — Progress Notes (Signed)
Thanks so much. 

## 2019-07-10 NOTE — Progress Notes (Signed)
Thank you. Maybe try to call her from our office and let her know to call digestive health back. I am worried about her.

## 2019-07-14 ENCOUNTER — Telehealth: Payer: Self-pay | Admitting: Neurology

## 2019-07-14 ENCOUNTER — Ambulatory Visit (INDEPENDENT_AMBULATORY_CARE_PROVIDER_SITE_OTHER): Payer: Medicare Other | Admitting: Physician Assistant

## 2019-07-14 DIAGNOSIS — Z5329 Procedure and treatment not carried out because of patient's decision for other reasons: Secondary | ICD-10-CM

## 2019-07-14 NOTE — Telephone Encounter (Signed)
I was able to speak with patient. She states she only has 12 trips a year available to her because she doesn't have a car. I urged the importance of going to the ER for evaluation. She expressed understanding. [name] -FYI.

## 2019-07-14 NOTE — Telephone Encounter (Signed)
Tried to call patient to discuss cancellation of appt. If she can not make it in and she has not seen specialists she needs to be evaluated in the ER due to low iron. Tried to call patient. No answer. No VM set up.  I will try to call again later. Sherion Dooly - FYI.

## 2019-07-14 NOTE — Progress Notes (Signed)
Called patient multiple times to encourage her to go to hospital if she did not see hematology or gastroenterology this week. I am worried she is bleeding internally.

## 2019-07-14 NOTE — Telephone Encounter (Signed)
I did suggest that.

## 2019-07-14 NOTE — Telephone Encounter (Signed)
She can always call an ambulance if needed.

## 2019-07-14 NOTE — Telephone Encounter (Signed)
Tried to call again with no answer  

## 2019-07-17 ENCOUNTER — Ambulatory Visit: Payer: Medicare Other | Admitting: Physician Assistant

## 2019-07-25 ENCOUNTER — Other Ambulatory Visit: Payer: Self-pay | Admitting: Physician Assistant

## 2019-07-25 DIAGNOSIS — H468 Other optic neuritis: Secondary | ICD-10-CM

## 2019-07-31 ENCOUNTER — Ambulatory Visit: Payer: Medicare Other | Admitting: Family

## 2019-07-31 ENCOUNTER — Inpatient Hospital Stay: Payer: Medicare Other

## 2019-08-01 ENCOUNTER — Other Ambulatory Visit: Payer: Self-pay | Admitting: Physician Assistant

## 2019-08-01 DIAGNOSIS — J4531 Mild persistent asthma with (acute) exacerbation: Secondary | ICD-10-CM

## 2019-08-02 ENCOUNTER — Other Ambulatory Visit: Payer: Self-pay | Admitting: Physician Assistant

## 2019-08-02 DIAGNOSIS — E11319 Type 2 diabetes mellitus with unspecified diabetic retinopathy without macular edema: Secondary | ICD-10-CM

## 2019-08-02 DIAGNOSIS — IMO0002 Reserved for concepts with insufficient information to code with codable children: Secondary | ICD-10-CM

## 2019-08-05 ENCOUNTER — Other Ambulatory Visit: Payer: Self-pay | Admitting: Physician Assistant

## 2019-08-05 DIAGNOSIS — J3089 Other allergic rhinitis: Secondary | ICD-10-CM

## 2019-08-07 ENCOUNTER — Other Ambulatory Visit: Payer: Self-pay | Admitting: Family

## 2019-08-07 DIAGNOSIS — I10 Essential (primary) hypertension: Secondary | ICD-10-CM

## 2019-08-07 DIAGNOSIS — E538 Deficiency of other specified B group vitamins: Secondary | ICD-10-CM

## 2019-08-07 DIAGNOSIS — D631 Anemia in chronic kidney disease: Secondary | ICD-10-CM

## 2019-08-07 DIAGNOSIS — D508 Other iron deficiency anemias: Secondary | ICD-10-CM

## 2019-08-08 ENCOUNTER — Inpatient Hospital Stay (HOSPITAL_BASED_OUTPATIENT_CLINIC_OR_DEPARTMENT_OTHER): Payer: Medicare Other | Admitting: Family

## 2019-08-08 ENCOUNTER — Other Ambulatory Visit: Payer: Self-pay

## 2019-08-08 ENCOUNTER — Inpatient Hospital Stay: Payer: Medicare Other | Attending: Family

## 2019-08-08 ENCOUNTER — Encounter: Payer: Self-pay | Admitting: Family

## 2019-08-08 VITALS — BP 209/68 | HR 93 | Temp 97.5°F | Resp 24 | Wt 229.8 lb

## 2019-08-08 DIAGNOSIS — Z801 Family history of malignant neoplasm of trachea, bronchus and lung: Secondary | ICD-10-CM | POA: Insufficient documentation

## 2019-08-08 DIAGNOSIS — R6889 Other general symptoms and signs: Secondary | ICD-10-CM | POA: Diagnosis not present

## 2019-08-08 DIAGNOSIS — K922 Gastrointestinal hemorrhage, unspecified: Secondary | ICD-10-CM | POA: Insufficient documentation

## 2019-08-08 DIAGNOSIS — Z803 Family history of malignant neoplasm of breast: Secondary | ICD-10-CM

## 2019-08-08 DIAGNOSIS — Z86010 Personal history of colonic polyps: Secondary | ICD-10-CM | POA: Insufficient documentation

## 2019-08-08 DIAGNOSIS — D631 Anemia in chronic kidney disease: Secondary | ICD-10-CM

## 2019-08-08 DIAGNOSIS — D508 Other iron deficiency anemias: Secondary | ICD-10-CM

## 2019-08-08 DIAGNOSIS — D500 Iron deficiency anemia secondary to blood loss (chronic): Secondary | ICD-10-CM | POA: Insufficient documentation

## 2019-08-08 DIAGNOSIS — Z87891 Personal history of nicotine dependence: Secondary | ICD-10-CM

## 2019-08-08 DIAGNOSIS — Z90710 Acquired absence of both cervix and uterus: Secondary | ICD-10-CM | POA: Insufficient documentation

## 2019-08-08 DIAGNOSIS — D509 Iron deficiency anemia, unspecified: Secondary | ICD-10-CM

## 2019-08-08 DIAGNOSIS — E538 Deficiency of other specified B group vitamins: Secondary | ICD-10-CM

## 2019-08-08 LAB — CBC WITH DIFFERENTIAL (CANCER CENTER ONLY)
Abs Immature Granulocytes: 0.18 10*3/uL — ABNORMAL HIGH (ref 0.00–0.07)
Basophils Absolute: 0.1 10*3/uL (ref 0.0–0.1)
Basophils Relative: 1 %
Eosinophils Absolute: 0.2 10*3/uL (ref 0.0–0.5)
Eosinophils Relative: 3 %
HCT: 31.2 % — ABNORMAL LOW (ref 36.0–46.0)
Hemoglobin: 8.6 g/dL — ABNORMAL LOW (ref 12.0–15.0)
Immature Granulocytes: 2 %
Lymphocytes Relative: 23 %
Lymphs Abs: 2.1 10*3/uL (ref 0.7–4.0)
MCH: 19.4 pg — ABNORMAL LOW (ref 26.0–34.0)
MCHC: 27.6 g/dL — ABNORMAL LOW (ref 30.0–36.0)
MCV: 70.3 fL — ABNORMAL LOW (ref 80.0–100.0)
Monocytes Absolute: 0.7 10*3/uL (ref 0.1–1.0)
Monocytes Relative: 8 %
Neutro Abs: 5.7 10*3/uL (ref 1.7–7.7)
Neutrophils Relative %: 63 %
Platelet Count: 354 10*3/uL (ref 150–400)
RBC: 4.44 MIL/uL (ref 3.87–5.11)
RDW: 20.3 % — ABNORMAL HIGH (ref 11.5–15.5)
WBC Count: 9 10*3/uL (ref 4.0–10.5)
nRBC: 0 % (ref 0.0–0.2)

## 2019-08-08 LAB — CMP (CANCER CENTER ONLY)
ALT: 30 U/L (ref 0–44)
AST: 24 U/L (ref 15–41)
Albumin: 4.1 g/dL (ref 3.5–5.0)
Alkaline Phosphatase: 124 U/L (ref 38–126)
Anion gap: 10 (ref 5–15)
BUN: 21 mg/dL (ref 8–23)
CO2: 25 mmol/L (ref 22–32)
Calcium: 9.7 mg/dL (ref 8.9–10.3)
Chloride: 100 mmol/L (ref 98–111)
Creatinine: 0.75 mg/dL (ref 0.44–1.00)
GFR, Est AFR Am: >60 mL/min (ref >60)
GFR, Estimated: >60 mL/min (ref >60)
Glucose, Bld: 267 mg/dL — ABNORMAL HIGH (ref 70–99)
Potassium: 4.4 mmol/L (ref 3.5–5.1)
Sodium: 135 mmol/L (ref 135–145)
Total Bilirubin: 0.3 mg/dL (ref 0.3–1.2)
Total Protein: 7.4 g/dL (ref 6.5–8.1)

## 2019-08-08 LAB — RETICULOCYTES
Immature Retic Fract: 32.2 % — ABNORMAL HIGH (ref 2.3–15.9)
RBC.: 4.38 MIL/uL (ref 3.87–5.11)
Retic Count, Absolute: 120.5 10*3/uL (ref 19.0–186.0)
Retic Ct Pct: 2.8 % (ref 0.4–3.1)

## 2019-08-08 LAB — FOLATE: Folate: 9.7 ng/mL (ref >5.9)

## 2019-08-08 LAB — VITAMIN B12: Vitamin B-12: 1885 pg/mL — ABNORMAL HIGH (ref 180–914)

## 2019-08-08 LAB — SAVE SMEAR(SSMR), FOR PROVIDER SLIDE REVIEW

## 2019-08-08 NOTE — Progress Notes (Signed)
Hematology/Oncology Consultation   Name: Jamie Strickland      MRN: [id number]    Location: Room/bed info not found  Date: 08/08/2019 Time:2:39 PM   REFERRING PHYSICIAN: Iran Planas, PA-C  REASON FOR CONSULT: Anemia    DIAGNOSIS:  Iron deficiency anemia secondary to GI blood loss  HISTORY OF PRESENT ILLNESS: Jamie Strickland is a very pleasant 74 yo caucasian female with history of anemia over the last few months.  She had an episodes of dark tarry stool around two months ago. She has not noted any blood loss since then.  No abnormal bruising. No petechiae.  Her last colonoscopy was in April 2016 and she had 2 benign polyps removed.  She is a little fatigued today but states that she did not sleep well last night.  Hgb is 8.6, MCV 70, platelets 354 and WBC count 9.0.  She is diabetic and is on Metformin and insulin. Hgb A1c 2 months ago was 10.4.  There is no family history of anemia that she is aware of.  She states that she had cervical cancer 40 years ago with total hysterectomy with removal of cervix.  No fever, chills, n/v, cough, rash, dizziness, chest pain, palpitations, abdominal pain or changes in bowel or bladder habits.  She states that one of her pills cause bloating and flatus.  She has occasional SOB secondary to exacerbation of asthma and seasonal allergies.  She states that 2 weeks ago she was treated for an asthma attack and fluid overload with lasix.  She quit smoking 30 years ago. She does not drink alcoholic beverages or use recreational drugs.  She has maintained a good appetite and is doing her best to stay well hydrated. Her weight is stable.  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   ROS: All other 10 point review of systems is negative.   PAST MEDICAL HISTORY:   Past Medical History:  Diagnosis Date  . Allergic rhinitis   . Diabetes mellitus type II   . Hx of colonic polyps   . Hyperlipidemia   . Hypertension   . Mild atherosclerosis of carotid  artery, right 11/22/2018  . Mild intermittent allergic asthma without complication 0/93/2355   Qualifier: Diagnosis of  By: Silvio Pate MD, [facility]   . Mild mitral regurgitation 08/10/2017  . Mild nonproliferative diabetic retinopathy ([place]) 09/25/2014   Dxed Dr Thelma Comp, 09/11/2014.   Changes noted in bilateral eyes?   . Osteoarthritis     ALLERGIES: Allergies  Allergen Reactions  . Invokana [Canagliflozin] Nausea And Vomiting and Other (See Comments)    Yeast infections  . Codeine Nausea And Vomiting    REACTION: n /v      MEDICATIONS:  Current Outpatient Medications on File Prior to Visit  Medication Sig Dispense Refill  . clopidogrel (PLAVIX) 75 MG tablet TAKE 1 TABLET BY MOUTH EVERY DAY 90 tablet 0  . ACCU-CHEK SMARTVIEW test strip USE AS INSTRUCTED 100 strip 3  . albuterol (PROAIR HFA) 108 (90 Base) MCG/ACT inhaler INHALE 1-2 PUFFS INTO THE LUNGS EVERY 4 (FOUR) HOURS AS NEEDED FOR WHEEZING OR SHORTNESS OF BREATH 16 g 0  . albuterol (PROVENTIL) (2.5 MG/3ML) 0.083% nebulizer solution Please specify directions, refills and quantity 75 mL 0  . amLODipine (NORVASC) 10 MG tablet Take 1 tablet (10 mg total) by mouth daily. 90 tablet 1  . atorvastatin (LIPITOR) 20 MG tablet TAKE 1 TABLET(20 MG) BY MOUTH DAILY 90 tablet 3  . ATROVENT  HFA 17 MCG/ACT inhaler INHALE 2-3 PUFFS INTO THE LUNGS EVERY 6 HOURS AS NEEDED FOR WHEEZING. 12.9 Inhaler 0  . BD INSULIN SYRINGE U/F 31G X 5/16" 0.5 ML MISC USE AS DIRECTED 100 each 1  . BD PEN NEEDLE NANO U/F 32G X 4 MM MISC USE DAILY 100 each 1  . carvedilol (COREG) 12.5 MG tablet Take 1 tablet (12.5 mg total) by mouth 2 (two) times daily with a meal. 180 tablet 1  . chlorthalidone (HYGROTON) 25 MG tablet Take 1 tablet (25 mg total) by mouth daily. 90 tablet 1  . Dulaglutide (TRULICITY) 1.5 MG/0.5ML SOPN Inject 1.5 mg into the skin once a week. 4 pen 2  . fluticasone (FLONASE) 50 MCG/ACT nasal spray Place 2 sprays into both nostrils daily. 16 g 6  .  insulin lispro (HUMALOG) 100 UNIT/ML injection INJECT 25 UNITS INTO THE SKIN 3 TIMES DAILY BEFORE MEALS 20 mL 10  . Insulin Pen Needle (NOVOFINE PLUS) 32G X 4 MM MISC For weekly injection with Ozempic 30 each 1  . levocetirizine (XYZAL) 5 MG tablet TAKE 1 TABLET BY MOUTH EVERY DAY IN THE EVENING 90 tablet 0  . lisinopril (ZESTRIL) 40 MG tablet Take 1 tablet (40 mg total) by mouth daily. 90 tablet 1  . metFORMIN (GLUCOPHAGE-XR) 500 MG 24 hr tablet Take 2 tablets (1,000 mg total) by mouth 2 (two) times daily with a meal. 360 tablet 1  . montelukast (SINGULAIR) 10 MG tablet TAKE 1 TABLET BY MOUTH EVERYDAY AT BEDTIME 90 tablet 0  . predniSONE (DELTASONE) 20 MG tablet Take 3 tablets for 3 days, take 2 tablets for 3 days, take 1 tablet for 3 days, take 1/2 tablet for 4 days. 20 tablet 0  . Semaglutide, 1 MG/DOSE, (OZEMPIC, 1 MG/DOSE,) 2 MG/1.5ML SOPN Inject 1 mg into the skin once a week. 12 pen 1  . TRESIBA FLEXTOUCH 200 UNIT/ML FlexTouch Pen INJECT 56 UNITS INTO THE SKIN EVERY MORNING. SELF-TITRATE BY 2U EVERY 3 DAYS. 9 pen 1  . WIXELA INHUB 250-50 MCG/DOSE AEPB INHALE 1 PUFF BY MOUTH TWICE A DAY 180 each 2   No current facility-administered medications on file prior to visit.     PAST SURGICAL HISTORY Past Surgical History:  Procedure Laterality Date  . 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 CATARACT EXTRACTION W/ INTRAOCULAR LENS  IMPLANT, BILATERAL Bilateral 2014  . CATARACT EXTRACTION W/ INTRAOCULAR LENS  IMPLANT, BILATERAL  11/14  . CHOLECYSTECTOMY    . LASIK      FAMILY HISTORY: Family History  Problem Relation Age of Onset  . Diabetes Mother   . Heart disease Mother   . Diabetes Father        brittle diabetes  . Asthma Father 82  . Liver disease Father        liver problems  . Heart disease Father   . Heart disease Brother        CAD  . Cancer Maternal Aunt        Breast  . Heart disease Maternal Grandfather        CAD  .  Cancer Brother        lung    SOCIAL HISTORY:  reports that she has quit smoking. She quit smokeless tobacco use about 11 years ago. She reports current alcohol use. She reports that she does not use drugs.  PERFORMANCE STATUS: The patient's performance status is 1 - Symptomatic but completely ambulatory  PHYSICAL EXAM: Most Recent Vital Signs: There were no vitals taken for this visit. There were no vitals taken for this visit.  General Appearance:    Alert, cooperative, no distress, appears stated age  Head:    Normocephalic, without obvious abnormality, atraumatic  Eyes:    PERRL, conjunctiva/corneas clear, EOM's intact, fundi    benign, both eyes        Throat:   Lips, mucosa, and tongue normal; teeth and gums normal  Neck:   Supple, symmetrical, trachea midline, no adenopathy;    thyroid:  no enlargement/tenderness/nodules; no carotid   bruit or JVD  Back:     Symmetric, no curvature, ROM normal, no CVA tenderness  Lungs:     Clear to auscultation bilaterally, respirations unlabored  Chest Wall:    No tenderness or deformity   Heart:    Regular rate and rhythm, S1 and S2 normal, no murmur, rub   or gallop     Abdomen:     Soft, non-tender, bowel sounds active all four quadrants,    no masses, no organomegaly        Extremities:   Extremities normal, atraumatic, no cyanosis or edema  Pulses:   2+ and symmetric all extremities  Skin:   Skin color, texture, turgor normal, no rashes or lesions  Lymph nodes:   Cervical, supraclavicular, and axillary nodes normal  Neurologic:   CNII-XII intact, normal strength, sensation and reflexes    throughout    LABORATORY DATA:  Results for orders placed or performed in visit on 08/08/19 (from the past 48 hour(s))  CBC with Differential (Cancer Center Only)     Status: Abnormal   Collection Time: 08/08/19  1:52 PM  Result Value Ref Range   WBC Count 9.0 4.0 - 10.5 K/uL   RBC 4.44 3.87 - 5.11 MIL/uL   Hemoglobin 8.6 (L) 12.0 - 15.0  g/dL    Comment: Reticulocyte Hemoglobin testing may be clinically indicated, consider ordering this additional test [id number]    HCT 31.2 (L) 36.0 - 46.0 %   MCV 70.3 (L) 80.0 - 100.0 fL   MCH 19.4 (L) 26.0 - 34.0 pg   MCHC 27.6 (L) 30.0 - 36.0 g/dL   RDW 54.020.3 (H) 98.111.5 - 19.115.5 %   Platelet Count 354 150 - 400 K/uL   nRBC 0.0 0.0 - 0.2 %   Neutrophils Relative % 63 %   Neutro Abs 5.7 1.7 - 7.7 K/uL   Lymphocytes Relative 23 %   Lymphs Abs 2.1 0.7 - 4.0 K/uL   Monocytes Relative 8 %   Monocytes Absolute 0.7 0.1 - 1.0 K/uL   Eosinophils Relative 3 %   Eosinophils Absolute 0.2 0.0 - 0.5 K/uL   Basophils Relative 1 %   Basophils Absolute 0.1 0.0 - 0.1 K/uL   Immature Granulocytes 2 %   Abs Immature Granulocytes 0.18 (H) 0.00 - 0.07 K/uL    Comment: Performed at [facility] [place], [address], [place], [place] [id number]  Reticulocytes     Status: Abnormal   Collection Time: 08/08/19  1:52 PM  Result Value Ref Range   Retic Ct Pct 2.8 0.4 - 3.1 %   RBC. 4.38 3.87 - 5.11 MIL/uL   Retic Count, Absolute 120.5 19.0 - 186.0 K/uL   Immature Retic Fract 32.2 (H) 2.3 - 15.9 %    Comment: Performed at [facility] [place], [address],  [place], [place] [address]  CMP (Cancer Center only)     Status: Abnormal   Collection Time: 08/08/19  1:52 PM  Result Value Ref Range   Sodium 135 135 - 145 mmol/L   Potassium 4.4 3.5 - 5.1 mmol/L   Chloride 100 98 - 111 mmol/L   CO2 25 22 - 32 mmol/L   Glucose, Bld 267 (H) 70 - 99 mg/dL    Comment: Glucose reference range applies only to samples taken after fasting for at least 8 hours.   BUN 21 8 - 23 mg/dL   Creatinine 6.04 5.40 - 1.00 mg/dL   Calcium 9.7 8.9 - 98.1 mg/dL   Total Protein 7.4 6.5 - 8.1 g/dL   Albumin 4.1 3.5 - 5.0 g/dL   AST 24 15 - 41 U/L   ALT 30 0 - 44 U/L   Alkaline Phosphatase 124 38 - 126 U/L   Total Bilirubin 0.3 0.3 - 1.2 mg/dL   GFR, Est Non Af  Am >19 >60 mL/min   GFR, Est AFR Am >60 >60 mL/min   Anion gap 10 5 - 15    Comment: Performed at [facility] at [facility], [address], [place], [place] [address]  Save Smear [facility])     Status: None   Collection Time: 08/08/19  1:52 PM  Result Value Ref Range   Smear Review SMEAR STAINED AND AVAILABLE FOR REVIEW     Comment: Performed at [facility] at [facility], [address], [place], [place] [address]      RADIOGRAPHY: No results found.     PATHOLOGY: None   ASSESSMENT/PLAN: Ms. Abundis is a very pleasant 74 yo caucasian female with history of iron deficiency anemia secondary to GI blood loss.  Her iron saturation is 3% and ferritin 11.  Erythropoietin level is 118.7.  She is not interested in IV iron at this time. She will consider taking an oral iron supplement every other day.  We do recommend that she had another colonoscopy to r/o ulcer vs possible malignancy. She verbalized understanding but states that she is not interested in having another colonoscopy.  She will follow-up as needed and contact our office with any future heme/onc needs.   All questions were answered and she is in agreement with the plan. She can contact our office with any questions or concerns. We can certainly see her sooner if needed.   She was discussed with and also seen by Dr. Dion Body and he is in agreement with the aforementioned.   Emeline Gins, NP    Addendum: I interviewed and examined the patient.  I reviewed the patient's records extensively as well as NP Zayneb Baucum's note, and agree with the plan as detailed.  In summary, patient had an episode of melena approximately 2 months ago, but has not had any recurrent melena or hematochezia since then.  Review of the patient's labs was notable for microcytic anemia and reactive thrombocytosis.  Iron profile was consistent with severe iron deficiency.  Patient denies any history  of gastric surgery, such as gastric bypass.  She is not a vegetarian.  I personally reviewed the patient's peripheral blood smear today.  The red blood cells were microcytic and hypochromic, consistent with iron deficiency anemia.  There was no schistocytosis.  The white blood cells were of normal morphology. There were no peripheral circulating blasts. The platelets were of normal size and I verified that there  were no platelet clumping.  I discussed with the patient in detail the management of iron deficiency anemia, including oral iron supplement.  Given her moderate anemia, I recommended IV iron infusion to achieve faster hematopoiesis.  We also discussed some of the side effects of IV iron infusion.  Patient declined IV iron infusion, but was amenable to start oral iron supplement every other day.  More importantly, given the unexplained iron deficiency anemia, I strongly recommended the patient to be evaluated by gastroenterology to rule out any GI bleeding, including malignancy.  However, despite extensive counseling, patient declined gastroenterology evaluation.  I recommended the patient to continue the discussion with her primary care team.  Patient was not interested in outpatient hematology follow-up.  I recommended patient to continue periodic CBC monitoring with her PCP.  We will be happy to see her if she is amenable in the future.  The total time spent in the encounter was 55 minutes, including face-to-face time with the patient, review of various tests results, order additional studies/medications, documentation, and coordination of care plan.   Arthur Holms, MD

## 2019-08-09 ENCOUNTER — Telehealth: Payer: Self-pay | Admitting: Nurse Practitioner

## 2019-08-09 LAB — IRON AND TIBC
Iron: 17 ug/dL — ABNORMAL LOW (ref 41–142)
Saturation Ratios: 3 % — ABNORMAL LOW (ref 21–57)
TIBC: 524 ug/dL — ABNORMAL HIGH (ref 236–444)
UIBC: 507 ug/dL — ABNORMAL HIGH (ref 120–384)

## 2019-08-09 LAB — FERRITIN: Ferritin: 11 ng/mL (ref 11–307)

## 2019-08-09 LAB — LACTATE DEHYDROGENASE: LDH: 231 U/L — ABNORMAL HIGH (ref 98–192)

## 2019-08-09 LAB — ERYTHROPOIETIN: Erythropoietin: 118.7 m[IU]/mL — ABNORMAL HIGH (ref 2.6–18.5)

## 2019-08-09 LAB — SAMPLE TO BLOOD BANK

## 2019-08-09 NOTE — Telephone Encounter (Signed)
No los 3/30 

## 2019-08-09 NOTE — Telephone Encounter (Signed)
Incorrect provider attached.  It should be Emeline Gins, NP for 3/30 visit NO LOS

## 2019-08-10 ENCOUNTER — Telehealth: Payer: Self-pay | Admitting: *Deleted

## 2019-08-10 NOTE — Telephone Encounter (Signed)
As noted below by Emeline Gins, NP, I tried calling patient to give her iron lab results. Her cell phone is not set up to receive voice mail messages. Maralyn Sago recommends trying an oral iron supplement, such as, Gentle Iron, which is over the counter. If she changes her mind and would like IV iron, please call the office. I will try calling her to give her this message.

## 2019-08-10 NOTE — Telephone Encounter (Signed)
-----   Message from Verdie Mosher, NP sent at 08/10/2019 11:56 AM EDT ----- Iron is quite low. Would recommend she try taking an oral iron supplement. If she ever changes her mind and wants to consider IV iron please let us know :) She can just follow-up as needed.   Sarah ----- Message ----- From: Leory Plowman, [facility] [place] Sent: 08/08/2019   2:07 PM EDT To: Verdie Mosher, NP

## 2019-08-14 ENCOUNTER — Telehealth: Payer: Self-pay | Admitting: *Deleted

## 2019-08-14 NOTE — Telephone Encounter (Signed)
Call received from patient stating that she received a call from our office, but does not have voicemail set up.  Pt instructed per order of S. [place] NP to "take an oral iron supplement, such as, Gentle Iron, which is over the counter. If she changes her mind and would like IV iron, please call the office."  Pt states that she will take an over the counter supplement, does not want IV iron and does not feel as though she needs a f/u with this office.

## 2019-08-14 NOTE — Progress Notes (Signed)
We need to keep close follow up on hemoglobin. She would only agree to oral iron. Recheck in 3 weeks labs. Her BP was terribly high at hematology office visit. I would like to see her in clinic to recheck. If she has a BP cuff at home or could get one to get readings would be greatly appreciated. How is patient feeling?

## 2019-08-14 NOTE — Progress Notes (Signed)
Patient made aware. Appt made to recheck blood pressure and will have labs drawn at that time. She broke her home cuff so no way to check it at home.

## 2019-08-14 NOTE — Progress Notes (Signed)
Tried to call patient with no answer, no vm set up.

## 2019-08-27 ENCOUNTER — Other Ambulatory Visit: Payer: Self-pay | Admitting: Physician Assistant

## 2019-08-27 DIAGNOSIS — J4531 Mild persistent asthma with (acute) exacerbation: Secondary | ICD-10-CM

## 2019-08-28 ENCOUNTER — Other Ambulatory Visit: Payer: Self-pay | Admitting: Physician Assistant

## 2019-08-28 DIAGNOSIS — IMO0002 Reserved for concepts with insufficient information to code with codable children: Secondary | ICD-10-CM

## 2019-08-28 DIAGNOSIS — E11319 Type 2 diabetes mellitus with unspecified diabetic retinopathy without macular edema: Secondary | ICD-10-CM

## 2019-08-30 ENCOUNTER — Other Ambulatory Visit: Payer: Self-pay | Admitting: Physician Assistant

## 2019-08-30 DIAGNOSIS — R600 Localized edema: Secondary | ICD-10-CM

## 2019-08-30 DIAGNOSIS — R0602 Shortness of breath: Secondary | ICD-10-CM

## 2019-08-31 ENCOUNTER — Ambulatory Visit: Payer: Medicare Other | Admitting: Physician Assistant

## 2019-08-31 DIAGNOSIS — R6889 Other general symptoms and signs: Secondary | ICD-10-CM | POA: Diagnosis not present

## 2019-09-09 ENCOUNTER — Other Ambulatory Visit: Payer: Self-pay | Admitting: Physician Assistant

## 2019-09-09 DIAGNOSIS — IMO0002 Reserved for concepts with insufficient information to code with codable children: Secondary | ICD-10-CM

## 2019-09-09 DIAGNOSIS — E11319 Type 2 diabetes mellitus with unspecified diabetic retinopathy without macular edema: Secondary | ICD-10-CM

## 2019-09-18 ENCOUNTER — Other Ambulatory Visit: Payer: Self-pay | Admitting: Physician Assistant

## 2019-09-18 DIAGNOSIS — E11319 Type 2 diabetes mellitus with unspecified diabetic retinopathy without macular edema: Secondary | ICD-10-CM

## 2019-09-18 DIAGNOSIS — IMO0002 Reserved for concepts with insufficient information to code with codable children: Secondary | ICD-10-CM

## 2019-10-02 ENCOUNTER — Ambulatory Visit (INDEPENDENT_AMBULATORY_CARE_PROVIDER_SITE_OTHER): Payer: Medicare Other | Admitting: Physician Assistant

## 2019-10-02 VITALS — BP 164/78 | HR 88 | Ht 63.0 in | Wt 226.0 lb

## 2019-10-02 DIAGNOSIS — E11319 Type 2 diabetes mellitus with unspecified diabetic retinopathy without macular edema: Secondary | ICD-10-CM | POA: Diagnosis not present

## 2019-10-02 DIAGNOSIS — Z794 Long term (current) use of insulin: Secondary | ICD-10-CM | POA: Diagnosis not present

## 2019-10-02 DIAGNOSIS — R6889 Other general symptoms and signs: Secondary | ICD-10-CM | POA: Diagnosis not present

## 2019-10-02 DIAGNOSIS — E1159 Type 2 diabetes mellitus with other circulatory complications: Secondary | ICD-10-CM

## 2019-10-02 DIAGNOSIS — I10 Essential (primary) hypertension: Secondary | ICD-10-CM | POA: Diagnosis not present

## 2019-10-02 DIAGNOSIS — E1165 Type 2 diabetes mellitus with hyperglycemia: Secondary | ICD-10-CM

## 2019-10-02 DIAGNOSIS — E785 Hyperlipidemia, unspecified: Secondary | ICD-10-CM

## 2019-10-02 DIAGNOSIS — J4530 Mild persistent asthma, uncomplicated: Secondary | ICD-10-CM

## 2019-10-02 DIAGNOSIS — D509 Iron deficiency anemia, unspecified: Secondary | ICD-10-CM

## 2019-10-02 DIAGNOSIS — IMO0002 Reserved for concepts with insufficient information to code with codable children: Secondary | ICD-10-CM

## 2019-10-02 DIAGNOSIS — I152 Hypertension secondary to endocrine disorders: Secondary | ICD-10-CM

## 2019-10-02 LAB — POCT GLYCOSYLATED HEMOGLOBIN (HGB A1C): Hemoglobin A1C: 8 % — AB (ref 4.0–5.6)

## 2019-10-02 LAB — POCT HEMOGLOBIN: Hemoglobin: 12.7 g/dL (ref 11–14.6)

## 2019-10-02 MED ORDER — GLIPIZIDE 10 MG PO TABS: 10.0000 mg | ORAL_TABLET | Freq: Two times a day (BID) | ORAL | 2 refills | Status: DC

## 2019-10-02 NOTE — Progress Notes (Signed)
Subjective:    Patient ID: Jamie Strickland, female    DOB: 11-07-1945, 74 y.o.   MRN: [id number]  HPI  Pt is a 74 yo female with T2DM, IDA, HTN, HLD, asthma who presents to the clinic for 3 month follow up.   DM- her sugars are not controlled. She reports 320 this am but usually around 180 per patient. She was not able to tolerate trulicity or ozempic due to cost and GI side effects. She is on metformin, humalin, tresbia. No open sores or wounds. No hypoglycemic events.   Pt is checking her BP at home and are ranging in the 140s to 160s. BP was elevated when she saw hematology. No CP, palpitations, headaches, vision changes, or dizziness. She declined IV iron but taking Cats liver and hemoglobin seems to be responding. Has not been to GI to consider colonoscopy. Last colonoscopy 2016.  She denies any black tarry stools or bright red blood in stool.   She is using albuterol inhaler more regularly due to spring allergies. No significant SOB or wheezing.  .. Active Ambulatory Problems    Diagnosis Date Noted  . Hyperlipidemia LDL goal <70 12/28/2007  . Allergic rhinitis 12/28/2007  . OSTEOARTHRITIS 12/28/2007  . COLONIC POLYPS, HX OF 12/28/2007  . Encounter for medication management 08/31/2011  . Uncontrolled type 2 diabetes mellitus with retinopathy, with long-term current use of insulin ([place]) 08/31/2011  . Obesity 02/29/2012  . Actinic keratosis 11/13/2013  . Mild nonproliferative diabetic retinopathy ([place]) 09/25/2014  . Medically noncompliant 02/20/2015  . Stroke-like symptom 10/04/2016  . Hypertension associated with type 2 diabetes mellitus ([place]) 11/13/2016  . Hyperlipidemia associated with type 2 diabetes mellitus ([place]) 12/30/2016  . Bilateral lower extremity edema 08/02/2017  . Dyspnea on exertion 08/02/2017  . History of echocardiogram 08/10/2017  . Mild mitral regurgitation 08/10/2017  . Mild persistent asthma without complication 41/32/4401  . Need for  diphtheria-tetanus-pertussis (Tdap) vaccine 03/07/2018  . Chronic middle ear effusion, left 05/25/2018  . Uncontrolled stage 2 hypertension 08/24/2018  . At moderate risk for fall 09/16/2018  . Mild atherosclerosis of carotid artery, right 11/22/2018  . 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/25/2018  . Shortness of breath 07/05/2019  . Anemia 07/07/2019  . Generalized abdominal pain 07/07/2019  . Black tarry stools 07/07/2019   Resolved Ambulatory Problems    Diagnosis Date Noted  . Essential hypertension, benign 12/28/2007  . Mild intermittent allergic asthma without complication 02/72/5366  . VAGINITIS 04/11/2010  . BACK PAIN 04/11/2010  . Cellulitis of buttock, right 01/22/2012  . Strain of knee and leg, right 09/06/2012  . Acute otalgia, left 12/10/2017   Past Medical History:  Diagnosis Date  . Allergic rhinitis   . Diabetes mellitus type II   . Hx of colonic polyps   . Hyperlipidemia   . Hypertension   . Osteoarthritis       Review of Systems See HPI.     Objective:   Physical Exam Vitals reviewed.  Constitutional:      Appearance: Normal appearance. She is obese.  HENT:     Head: Normocephalic.  Neck:     Vascular: No carotid bruit.  Cardiovascular:     Rate and Rhythm: Normal rate and regular rhythm.     Heart sounds: Murmur present.  Pulmonary:     Effort: Pulmonary effort is normal.     Breath sounds: Normal breath sounds.  Musculoskeletal:     Right lower leg: No edema.  Left lower leg: No edema.  Neurological:     General: No focal deficit present.     Mental Status: She is alert and oriented to person, place, and time.  Psychiatric:        Mood and Affect: Mood normal.        Behavior: Behavior normal.           Assessment & Plan:  Marland KitchenMarland KitchenCorie was seen today for hypertension and diabetes.  Diagnoses and all orders for this visit:  Iron deficiency anemia, unspecified iron deficiency anemia type -     POCT hemoglobin  Uncontrolled  type 2 diabetes mellitus with retinopathy, with long-term current use of insulin (HCC) -     POCT glycosylated hemoglobin (Hb A1C) -     glipiZIDE (GLUCOTROL) 10 MG tablet; Take 1 tablet (10 mg total) by mouth 2 (two) times daily before a meal.  Hypertension associated with type 2 diabetes mellitus (HCC)  Uncontrolled stage 2 hypertension  Mild persistent asthma without complication  Hyperlipidemia LDL goal <70    Results for orders placed or performed in visit on 10/02/19  POCT glycosylated hemoglobin (Hb A1C)  Result Value Ref Range   Hemoglobin A1C 8.0 (A) 4.0 - 5.6 %   HbA1c POC (<> result, manual entry)     HbA1c, POC (prediabetic range)     HbA1c, POC (controlled diabetic range)    POCT hemoglobin  Result Value Ref Range   Hemoglobin 12.7 11 - 14.6 g/dL   N4O not to goal.  Added glipizide bid.  Discussed tresiba titration. Take am 40 units and pm 10 units. Increase night dose by 2 units every 5 days until fasting glucose 90-130.  On statin.  On ACE. BP not controlled. See below.  Vaccine UTD.   BP not to goal. On lisinopril, Coreg, norvasc, HCTZ at max dose. Pt does not want to start another medication. Explained risk. She will keep BP log and report readings in 2 weeks. Consider adding hydralazine at next visit if BP still elevated.   Hemoglobin has improved she is taking Cats liver OTC. She does feel better. She saw hematology twice. She has still not completed colonoscopy. She refuses. Pt aware of risk and that evaluation from where possible blood loss was coming from has not been identified.   Asthma-using rescue inhaler a little more through spring months but overall controlled on daily ICS inhaler.   Follow up in 3 months.

## 2019-10-02 NOTE — Patient Instructions (Signed)
Tresbia 40 units in the morning and 10 units at night.  Then increase by 2 units every 5 days until morning sugars are 90-130 and evening sugars 180 under.  Add glipizide at 2 meals.

## 2019-10-03 ENCOUNTER — Encounter: Payer: Self-pay | Admitting: Physician Assistant

## 2019-10-04 ENCOUNTER — Other Ambulatory Visit: Payer: Self-pay | Admitting: Physician Assistant

## 2019-10-04 DIAGNOSIS — IMO0002 Reserved for concepts with insufficient information to code with codable children: Secondary | ICD-10-CM

## 2019-10-04 DIAGNOSIS — E785 Hyperlipidemia, unspecified: Secondary | ICD-10-CM

## 2019-10-19 ENCOUNTER — Other Ambulatory Visit: Payer: Self-pay | Admitting: Physician Assistant

## 2019-10-19 DIAGNOSIS — H468 Other optic neuritis: Secondary | ICD-10-CM

## 2019-10-27 ENCOUNTER — Other Ambulatory Visit: Payer: Self-pay | Admitting: Physician Assistant

## 2019-10-27 DIAGNOSIS — IMO0002 Reserved for concepts with insufficient information to code with codable children: Secondary | ICD-10-CM

## 2019-11-01 ENCOUNTER — Other Ambulatory Visit: Payer: Self-pay | Admitting: Physician Assistant

## 2019-11-01 DIAGNOSIS — J3089 Other allergic rhinitis: Secondary | ICD-10-CM

## 2019-11-18 ENCOUNTER — Other Ambulatory Visit: Payer: Self-pay | Admitting: Physician Assistant

## 2019-11-18 DIAGNOSIS — J4531 Mild persistent asthma with (acute) exacerbation: Secondary | ICD-10-CM

## 2019-11-24 IMAGING — MG DIGITAL SCREENING BILATERAL MAMMOGRAM WITH TOMO AND CAD
8 series · 8 of 24 positions shown · non-contrast
Comparison: Previous exam(s).

ACR Breast Density Category a: The breast tissue is almost entirely
fatty.

CLINICAL DATA: Screening.

EXAM:
DIGITAL SCREENING BILATERAL MAMMOGRAM WITH TOMO AND CAD

[R CC synth-2D]
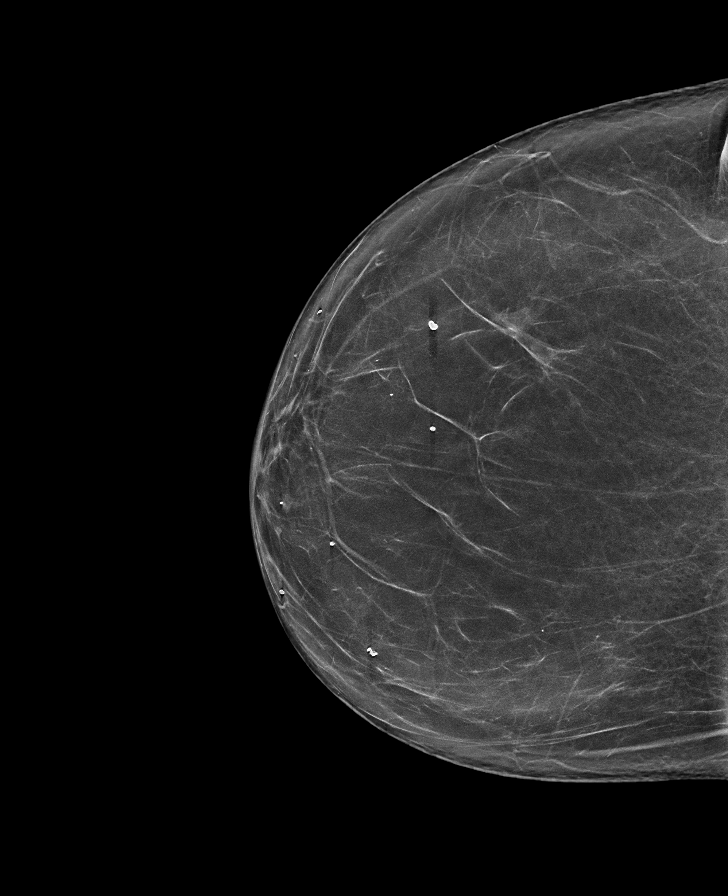

[R MLO synth-2D]
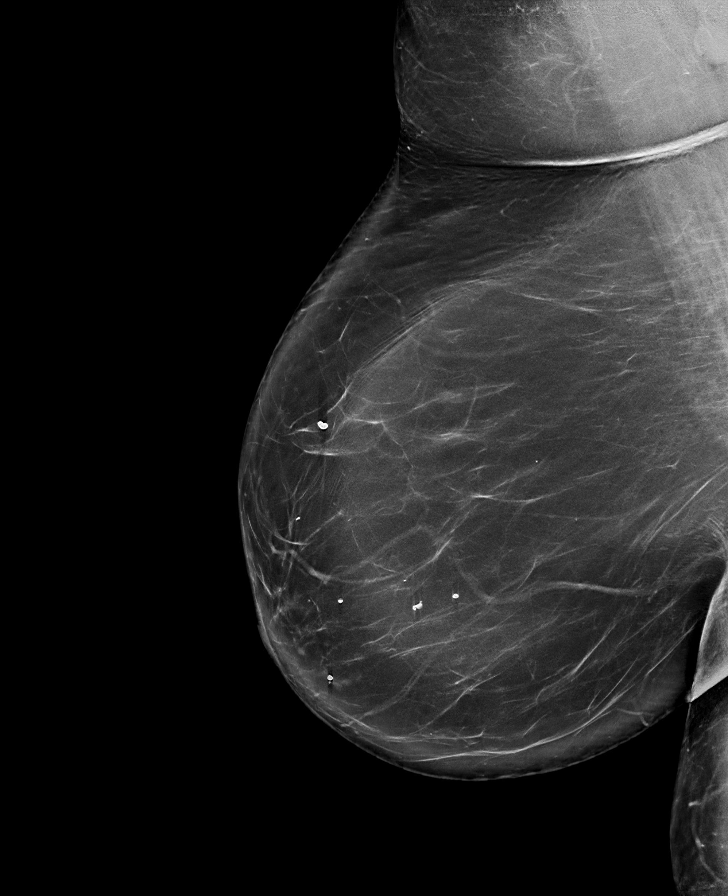

[L MLO synth-2D]
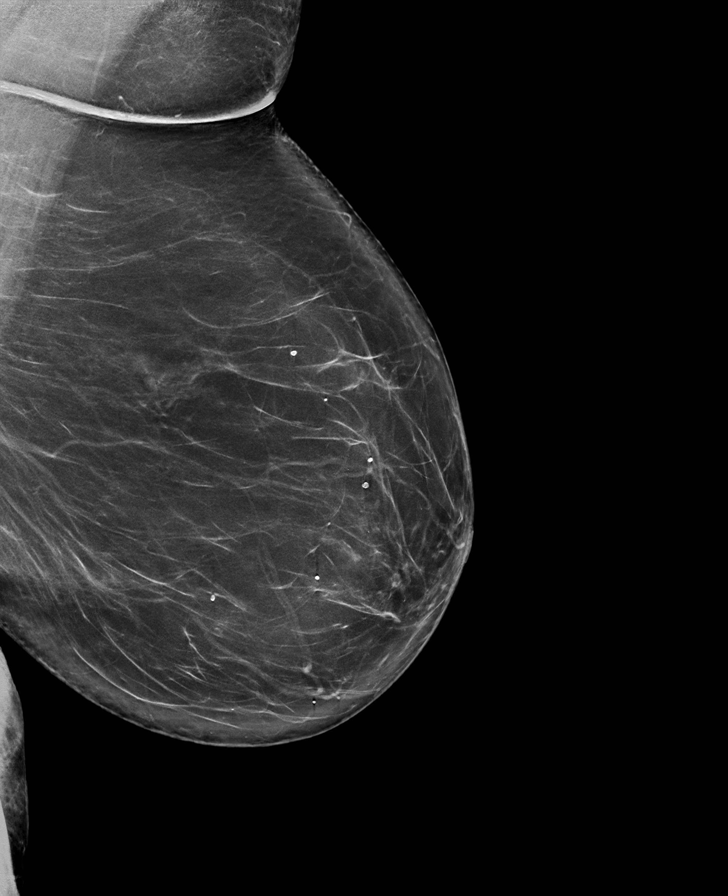

[L CC synth-2D]
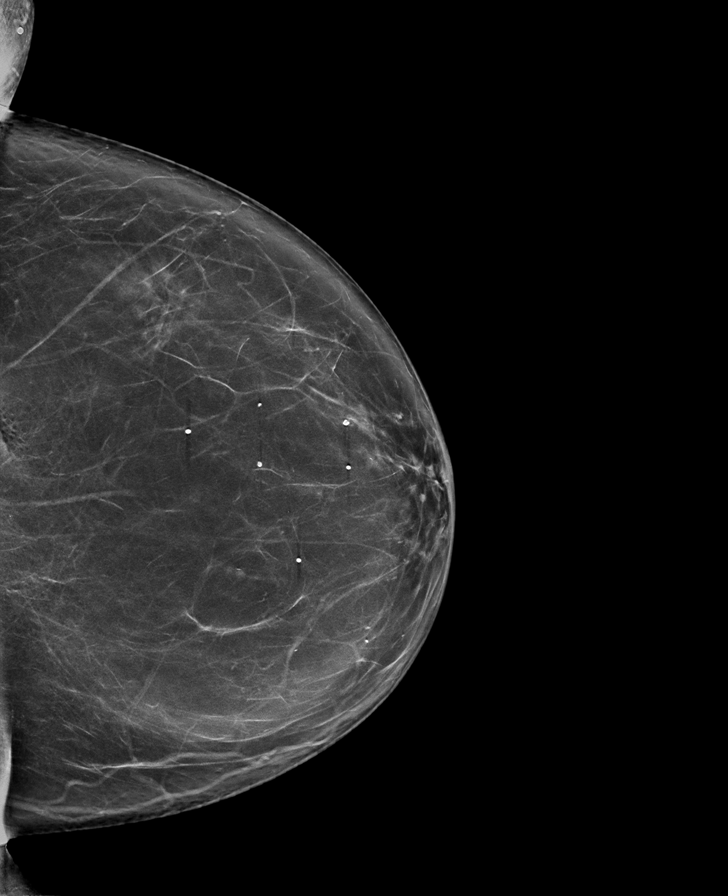

[L MLO tomo · tomo slice 45/89.0]
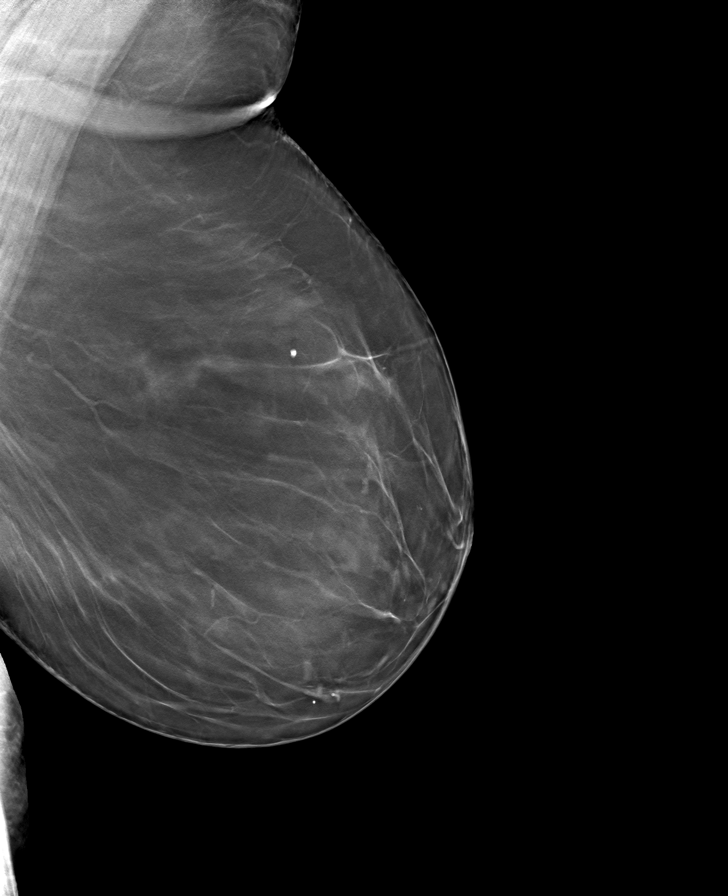

[L CC tomo · tomo slice 39/78.0]
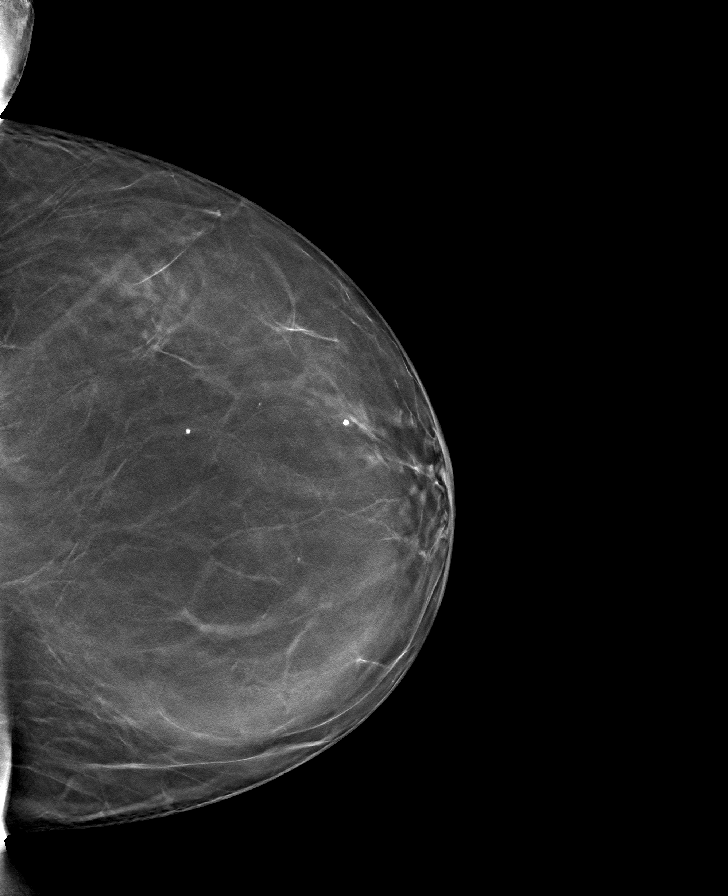

[R MLO tomo · tomo slice 49/98.0]
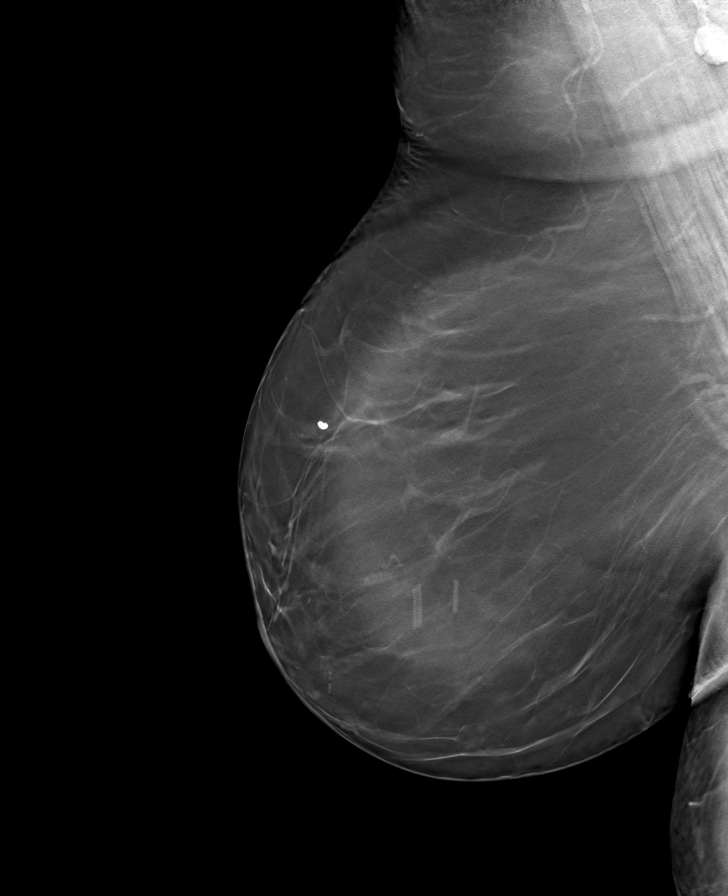

[R CC tomo · tomo slice 40/79.0]
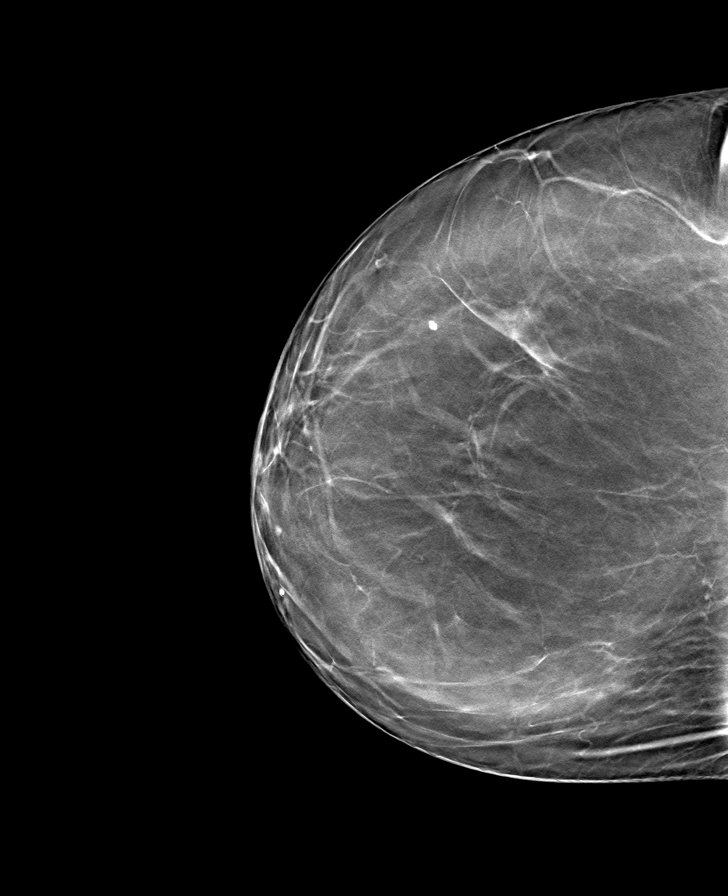

[8 of 24 positions shown; findings below may reference images not displayed]

FINDINGS: There are no findings suspicious for malignancy. Images were
processed with CAD.
IMPRESSION: No mammographic evidence of malignancy. A result letter of this
screening mammogram will be mailed directly to the patient.

RECOMMENDATION:
Screening mammogram in one year. (Code:8Y-[id number])

BI-RADS CATEGORY  1: Negative.

## 2019-11-28 ENCOUNTER — Other Ambulatory Visit: Payer: Self-pay | Admitting: Physician Assistant

## 2019-11-28 DIAGNOSIS — IMO0002 Reserved for concepts with insufficient information to code with codable children: Secondary | ICD-10-CM

## 2019-12-01 ENCOUNTER — Other Ambulatory Visit: Payer: Self-pay | Admitting: Physician Assistant

## 2019-12-01 DIAGNOSIS — IMO0002 Reserved for concepts with insufficient information to code with codable children: Secondary | ICD-10-CM

## 2019-12-01 DIAGNOSIS — E11319 Type 2 diabetes mellitus with unspecified diabetic retinopathy without macular edema: Secondary | ICD-10-CM

## 2019-12-23 ENCOUNTER — Other Ambulatory Visit: Payer: Self-pay | Admitting: Physician Assistant

## 2019-12-23 DIAGNOSIS — E11319 Type 2 diabetes mellitus with unspecified diabetic retinopathy without macular edema: Secondary | ICD-10-CM

## 2019-12-23 DIAGNOSIS — IMO0002 Reserved for concepts with insufficient information to code with codable children: Secondary | ICD-10-CM

## 2019-12-25 ENCOUNTER — Other Ambulatory Visit: Payer: Self-pay | Admitting: Physician Assistant

## 2019-12-26 DIAGNOSIS — E785 Hyperlipidemia, unspecified: Secondary | ICD-10-CM | POA: Diagnosis not present

## 2019-12-26 DIAGNOSIS — R040 Epistaxis: Secondary | ICD-10-CM | POA: Diagnosis not present

## 2019-12-26 DIAGNOSIS — Z87891 Personal history of nicotine dependence: Secondary | ICD-10-CM | POA: Diagnosis not present

## 2019-12-26 DIAGNOSIS — S0990XA Unspecified injury of head, initial encounter: Secondary | ICD-10-CM | POA: Diagnosis not present

## 2019-12-26 DIAGNOSIS — G8911 Acute pain due to trauma: Secondary | ICD-10-CM | POA: Diagnosis not present

## 2019-12-26 DIAGNOSIS — Z885 Allergy status to narcotic agent status: Secondary | ICD-10-CM | POA: Diagnosis not present

## 2019-12-26 DIAGNOSIS — E119 Type 2 diabetes mellitus without complications: Secondary | ICD-10-CM | POA: Diagnosis not present

## 2019-12-26 DIAGNOSIS — Z79899 Other long term (current) drug therapy: Secondary | ICD-10-CM | POA: Diagnosis not present

## 2019-12-26 DIAGNOSIS — Z7984 Long term (current) use of oral hypoglycemic drugs: Secondary | ICD-10-CM | POA: Diagnosis not present

## 2019-12-26 DIAGNOSIS — I10 Essential (primary) hypertension: Secondary | ICD-10-CM | POA: Diagnosis not present

## 2019-12-26 DIAGNOSIS — S0083XA Contusion of other part of head, initial encounter: Secondary | ICD-10-CM | POA: Diagnosis not present

## 2020-01-01 ENCOUNTER — Other Ambulatory Visit: Payer: Self-pay | Admitting: Physician Assistant

## 2020-01-01 DIAGNOSIS — IMO0002 Reserved for concepts with insufficient information to code with codable children: Secondary | ICD-10-CM

## 2020-01-01 DIAGNOSIS — E785 Hyperlipidemia, unspecified: Secondary | ICD-10-CM

## 2020-01-02 ENCOUNTER — Ambulatory Visit: Payer: Medicare Other | Admitting: Physician Assistant

## 2020-01-03 ENCOUNTER — Ambulatory Visit: Payer: Medicare Other | Admitting: Physician Assistant

## 2020-01-06 ENCOUNTER — Other Ambulatory Visit: Payer: Self-pay | Admitting: Physician Assistant

## 2020-01-06 DIAGNOSIS — R0602 Shortness of breath: Secondary | ICD-10-CM

## 2020-01-06 DIAGNOSIS — R600 Localized edema: Secondary | ICD-10-CM

## 2020-01-07 ENCOUNTER — Other Ambulatory Visit: Payer: Self-pay | Admitting: Physician Assistant

## 2020-01-07 DIAGNOSIS — IMO0002 Reserved for concepts with insufficient information to code with codable children: Secondary | ICD-10-CM

## 2020-01-17 ENCOUNTER — Ambulatory Visit: Payer: Medicare Other | Admitting: Physician Assistant

## 2020-01-20 ENCOUNTER — Other Ambulatory Visit: Payer: Self-pay | Admitting: Physician Assistant

## 2020-01-20 DIAGNOSIS — IMO0002 Reserved for concepts with insufficient information to code with codable children: Secondary | ICD-10-CM

## 2020-01-25 ENCOUNTER — Other Ambulatory Visit: Payer: Self-pay | Admitting: Physician Assistant

## 2020-01-25 DIAGNOSIS — J3089 Other allergic rhinitis: Secondary | ICD-10-CM

## 2020-01-29 ENCOUNTER — Encounter: Payer: Self-pay | Admitting: Physician Assistant

## 2020-01-29 ENCOUNTER — Other Ambulatory Visit: Payer: Self-pay

## 2020-01-29 ENCOUNTER — Ambulatory Visit (INDEPENDENT_AMBULATORY_CARE_PROVIDER_SITE_OTHER): Payer: Medicare Other | Admitting: Physician Assistant

## 2020-01-29 VITALS — BP 145/82 | HR 78 | Ht 63.0 in | Wt 237.0 lb

## 2020-01-29 DIAGNOSIS — J3089 Other allergic rhinitis: Secondary | ICD-10-CM

## 2020-01-29 DIAGNOSIS — Z794 Long term (current) use of insulin: Secondary | ICD-10-CM

## 2020-01-29 DIAGNOSIS — R0602 Shortness of breath: Secondary | ICD-10-CM

## 2020-01-29 DIAGNOSIS — H468 Other optic neuritis: Secondary | ICD-10-CM

## 2020-01-29 DIAGNOSIS — E1165 Type 2 diabetes mellitus with hyperglycemia: Secondary | ICD-10-CM

## 2020-01-29 DIAGNOSIS — IMO0002 Reserved for concepts with insufficient information to code with codable children: Secondary | ICD-10-CM

## 2020-01-29 DIAGNOSIS — E785 Hyperlipidemia, unspecified: Secondary | ICD-10-CM

## 2020-01-29 DIAGNOSIS — E1159 Type 2 diabetes mellitus with other circulatory complications: Secondary | ICD-10-CM

## 2020-01-29 DIAGNOSIS — E11319 Type 2 diabetes mellitus with unspecified diabetic retinopathy without macular edema: Secondary | ICD-10-CM | POA: Diagnosis not present

## 2020-01-29 DIAGNOSIS — J9611 Chronic respiratory failure with hypoxia: Secondary | ICD-10-CM

## 2020-01-29 DIAGNOSIS — J4530 Mild persistent asthma, uncomplicated: Secondary | ICD-10-CM

## 2020-01-29 DIAGNOSIS — I10 Essential (primary) hypertension: Secondary | ICD-10-CM

## 2020-01-29 LAB — POCT GLYCOSYLATED HEMOGLOBIN (HGB A1C): Hemoglobin A1C: 7.3 % — AB (ref 4.0–5.6)

## 2020-01-29 MED ORDER — INSULIN LISPRO 100 UNIT/ML ~~LOC~~ SOLN: SUBCUTANEOUS | 10 refills | Status: DC

## 2020-01-29 MED ORDER — CARVEDILOL 12.5 MG PO TABS: 12.5000 mg | ORAL_TABLET | Freq: Two times a day (BID) | ORAL | 1 refills | Status: DC

## 2020-01-29 MED ORDER — GLIPIZIDE 10 MG PO TABS: 10.0000 mg | ORAL_TABLET | Freq: Two times a day (BID) | ORAL | 0 refills | Status: DC

## 2020-01-29 MED ORDER — CLOPIDOGREL BISULFATE 75 MG PO TABS
75.0000 mg | ORAL_TABLET | Freq: Every day | ORAL | 1 refills | Status: AC
Start: 2020-01-29 — End: ?

## 2020-01-29 MED ORDER — METFORMIN HCL ER 500 MG PO TB24: 1000.0000 mg | ORAL_TABLET | Freq: Two times a day (BID) | ORAL | 1 refills | Status: DC

## 2020-01-29 MED ORDER — LEVOCETIRIZINE DIHYDROCHLORIDE 5 MG PO TABS: 5.0000 mg | ORAL_TABLET | Freq: Every evening | ORAL | 3 refills | Status: DC

## 2020-01-29 MED ORDER — TRESIBA FLEXTOUCH 200 UNIT/ML ~~LOC~~ SOPN: 56.0000 [IU] | PEN_INJECTOR | SUBCUTANEOUS | 1 refills | Status: DC

## 2020-01-29 MED ORDER — ATORVASTATIN CALCIUM 20 MG PO TABS: ORAL_TABLET | ORAL | 3 refills | Status: DC

## 2020-01-29 MED ORDER — BREZTRI AEROSPHERE 160-9-4.8 MCG/ACT IN AERO: 2.0000 | INHALATION_SPRAY | Freq: Two times a day (BID) | RESPIRATORY_TRACT | 2 refills | Status: DC

## 2020-01-29 MED ORDER — LISINOPRIL 40 MG PO TABS: 40.0000 mg | ORAL_TABLET | Freq: Every day | ORAL | 1 refills | Status: DC

## 2020-01-29 MED ORDER — CHLORTHALIDONE 25 MG PO TABS: 25.0000 mg | ORAL_TABLET | Freq: Every day | ORAL | 1 refills | Status: DC

## 2020-01-29 NOTE — Progress Notes (Addendum)
Subjective:    Patient ID: Jamie Strickland, female    DOB: Mar 19, 1946, 74 y.o.   MRN: [id number]  HPI 74 year old female presenting for DM2 check. She states that she is doing well with her diet and has given up meat in favor of eggs and protein rich beans and other plant based proteins. She also complains of shortness of breath that started this spring. She attributes it to allergies but denies rhinorrhea, cough, sore throat, itchy watery eyes, or other common allergic symptoms. She is having problems walking up a single flight of stairs and cannot walk for more than a few feet without becoming extremely winded. She also states that she cannot lay down without 5-6 pillows to prop her up so that she can breathe at night. She is using her albuterol inhaler 2 or more times daily with little relief. She also states that there are some days where she experiences swelling in her lower legs and ankles.   .. Active Ambulatory Problems    Diagnosis Date Noted  . Hyperlipidemia LDL goal <70 12/28/2007  . Allergic rhinitis 12/28/2007  . OSTEOARTHRITIS 12/28/2007  . COLONIC POLYPS, HX OF 12/28/2007  . Encounter for medication management 08/31/2011  . Uncontrolled type 2 diabetes mellitus with retinopathy, with long-term current use of insulin (HCC) 08/31/2011  . Obesity 02/29/2012  . Actinic keratosis 11/13/2013  . Mild nonproliferative diabetic retinopathy (HCC) 09/25/2014  . Medically noncompliant 02/20/2015  . Stroke-like symptom 10/04/2016  . Hypertension associated with type 2 diabetes mellitus (HCC) 11/13/2016  . Hyperlipidemia associated with type 2 diabetes mellitus (HCC) 12/30/2016  . Bilateral lower extremity edema 08/02/2017  . Dyspnea on exertion 08/02/2017  . History of echocardiogram 08/10/2017  . Mild mitral regurgitation 08/10/2017  . Mild persistent asthma without complication 12/10/2017  . Need for diphtheria-tetanus-pertussis (Tdap) vaccine 03/07/2018  . Chronic middle ear  effusion, left 05/25/2018  . Uncontrolled stage 2 hypertension 08/24/2018  . At moderate risk for fall 09/16/2018  . Mild atherosclerosis of carotid artery, right 11/22/2018  . 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/25/2018  . Shortness of breath 07/05/2019  . Anemia 07/07/2019  . Generalized abdominal pain 07/07/2019  . Black tarry stools 07/07/2019  . Chronic respiratory failure with hypoxia (HCC) 01/31/2020   Resolved Ambulatory Problems    Diagnosis Date Noted  . Essential hypertension, benign 12/28/2007  . Mild intermittent allergic asthma without complication 12/28/2007  . VAGINITIS 04/11/2010  . BACK PAIN 04/11/2010  . Cellulitis of buttock, right 01/22/2012  . Strain of knee and leg, right 09/06/2012  . Acute otalgia, left 12/10/2017   Past Medical History:  Diagnosis Date  . Allergic rhinitis   . Diabetes mellitus type II   . Hx of colonic polyps   . Hyperlipidemia   . Hypertension   . Osteoarthritis      Review of Systems  HENT: Negative for congestion, rhinorrhea, sinus pressure, sneezing and sore throat.   Respiratory: Positive for shortness of breath and wheezing. Negative for cough.   Cardiovascular: Negative for chest pain and palpitations.  Gastrointestinal: Negative for blood in stool, constipation, diarrhea, nausea and vomiting.  Genitourinary: Negative for dysuria and hematuria.  Neurological: Positive for dizziness.       Dizzy when SOB after walking for any length of time       Objective:   Physical Exam Vitals reviewed.  Constitutional:      Appearance: She is obese.  Cardiovascular:     Rate and Rhythm:  Normal rate and regular rhythm.     Pulses: Normal pulses.  Pulmonary:     Effort: Prolonged expiration present.     Breath sounds: Decreased air movement present. Examination of the right-upper field reveals decreased breath sounds. Examination of the left-upper field reveals decreased breath sounds. Examination of the right-middle field  reveals decreased breath sounds. Examination of the left-middle field reveals decreased breath sounds. Examination of the right-lower field reveals decreased breath sounds. Examination of the left-lower field reveals decreased breath sounds. Decreased breath sounds present.  Musculoskeletal:     Right lower leg: Edema present.     Left lower leg: Edema present.     Comments: +1 lower leg edema  Skin:    General: Skin is warm and dry.  Neurological:     Mental Status: She is alert.    POC HgB-A1c: 7.3  Failed 6 minute walk test see addendum.     Assessment & Plan:  Marland KitchenMarland KitchenDiagnoses and all orders for this visit:  Uncontrolled type 2 diabetes mellitus with retinopathy, with long-term current use of insulin (HCC) -     POCT HgB A1C -     insulin degludec (TRESIBA FLEXTOUCH) 200 UNIT/ML FlexTouch Pen; Inject 56 Units into the skin every morning. And 10 units at night. Self-titrate by 2U every 3 days until fasting glucose at 90-130 or 40 units. -     metFORMIN (GLUCOPHAGE-XR) 500 MG 24 hr tablet; Take 2 tablets (1,000 mg total) by mouth 2 (two) times daily with a meal. -     insulin lispro (HUMALOG) 100 UNIT/ML injection; INJECT 25 UNITS INTO THE SKIN 3 TIMES DAILY BEFORE MEALS -     glipiZIDE (GLUCOTROL) 10 MG tablet; Take 1 tablet (10 mg total) by mouth 2 (two) times daily before a meal. Needs appt/ FINAL REFILL -     atorvastatin (LIPITOR) 20 MG tablet; TAKE 1 TABLET BY MOUTH EVERY DAY  Mild persistent asthma without complication -     Budeson-Glycopyrrol-Formoterol (BREZTRI AEROSPHERE) 160-9-4.8 MCG/ACT AERO; Inhale 2 puffs into the lungs in the morning and at bedtime. -     CT Chest Wo Contrast -     COMPLETE METABOLIC PANEL WITH GFR  SOB (shortness of breath) on exertion -     CT Chest Wo Contrast -     COMPLETE METABOLIC PANEL WITH GFR  Hypertension associated with type 2 diabetes mellitus (HCC) -     lisinopril (ZESTRIL) 40 MG tablet; Take 1 tablet (40 mg total) by mouth daily. -      chlorthalidone (HYGROTON) 25 MG tablet; Take 1 tablet (25 mg total) by mouth daily. -     carvedilol (COREG) 12.5 MG tablet; Take 1 tablet (12.5 mg total) by mouth 2 (two) times daily with a meal. -     COMPLETE METABOLIC PANEL WITH GFR  Non-seasonal allergic rhinitis, unspecified trigger -     levocetirizine (XYZAL) 5 MG tablet; Take 1 tablet (5 mg total) by mouth every evening.  Ischemic optic neuritis of right eye -     clopidogrel (PLAVIX) 75 MG tablet; Take 1 tablet (75 mg total) by mouth daily.  Uncontrolled stage 2 hypertension -     chlorthalidone (HYGROTON) 25 MG tablet; Take 1 tablet (25 mg total) by mouth daily. -     COMPLETE METABOLIC PANEL WITH GFR  Hyperlipidemia LDL goal <70 -     atorvastatin (LIPITOR) 20 MG tablet; TAKE 1 TABLET BY MOUTH EVERY DAY -     Lipid  Panel w/reflex Direct LDL  Chronic respiratory failure with hypoxia (HCC) -     Budeson-Glycopyrrol-Formoterol (BREZTRI AEROSPHERE) 160-9-4.8 MCG/ACT AERO; Inhale 2 puffs into the lungs in the morning and at bedtime.   A1C not quite to goal.  Discussed tighter insulin control.  Discussed DM diet.  On ACE. BP not to goal but could be due to the labored breathing.  On statin.  Eye exam UTD. Foot exam UTD. Flu and pneumonia vaccine UTD.  Pt declines covid vaccine right now. Strongly encouraged to get this.   For SOB. Failed walk test. Hx of asthma.  Need CT of lungs.  Submitted for O2.  Stop Wixella.  Start Breztri for more relief.  Ok to use rescue every 4-6 hours.  She does have some edema. Pt declined echo that was ordered.   Follow up in 1 month.   ..I, Tandy Gaw PA-C, have reviewed and agree with the above documentation in it's entirety.

## 2020-01-29 NOTE — Progress Notes (Signed)
Patient ID: Jamie Strickland, female   DOB: 1945/07/06, 74 y.o.   MRN: [id number] 96 percent at rest on room air 87 percent with ambulation at 97 percent with ambulation

## 2020-01-29 NOTE — Patient Instructions (Addendum)
CT scan ordered.  Stop Wixela. Start Breztri 2 puffs twice a day.

## 2020-01-31 DIAGNOSIS — J9611 Chronic respiratory failure with hypoxia: Secondary | ICD-10-CM | POA: Insufficient documentation

## 2020-02-01 ENCOUNTER — Other Ambulatory Visit: Payer: Self-pay | Admitting: Physician Assistant

## 2020-02-01 DIAGNOSIS — IMO0002 Reserved for concepts with insufficient information to code with codable children: Secondary | ICD-10-CM

## 2020-02-21 ENCOUNTER — Ambulatory Visit (INDEPENDENT_AMBULATORY_CARE_PROVIDER_SITE_OTHER): Payer: Medicare Other

## 2020-02-21 ENCOUNTER — Other Ambulatory Visit: Payer: Self-pay

## 2020-02-21 DIAGNOSIS — R0602 Shortness of breath: Secondary | ICD-10-CM

## 2020-02-21 DIAGNOSIS — I10 Essential (primary) hypertension: Secondary | ICD-10-CM | POA: Diagnosis not present

## 2020-02-21 DIAGNOSIS — J4530 Mild persistent asthma, uncomplicated: Secondary | ICD-10-CM | POA: Diagnosis not present

## 2020-02-21 DIAGNOSIS — E1159 Type 2 diabetes mellitus with other circulatory complications: Secondary | ICD-10-CM | POA: Diagnosis not present

## 2020-02-21 DIAGNOSIS — E785 Hyperlipidemia, unspecified: Secondary | ICD-10-CM | POA: Diagnosis not present

## 2020-02-22 ENCOUNTER — Other Ambulatory Visit: Payer: Self-pay | Admitting: Physician Assistant

## 2020-02-22 DIAGNOSIS — J3089 Other allergic rhinitis: Secondary | ICD-10-CM

## 2020-02-22 LAB — LIPID PANEL W/REFLEX DIRECT LDL
Cholesterol: 130 mg/dL (ref <200)
HDL: 33 mg/dL — ABNORMAL LOW (ref >=50)
LDL Cholesterol (Calc): 66 mg/dL (calc)
Non-HDL Cholesterol (Calc): 97 mg/dL (calc) (ref <130)
Total CHOL/HDL Ratio: 3.9 (calc) (ref <5.0)
Triglycerides: 245 mg/dL — ABNORMAL HIGH (ref <150)

## 2020-02-22 LAB — COMPLETE METABOLIC PANEL WITH GFR
AG Ratio: 1.2 (calc) (ref 1.0–2.5)
ALT: 17 U/L (ref 6–29)
AST: 15 U/L (ref 10–35)
Albumin: 3.8 g/dL (ref 3.6–5.1)
Alkaline phosphatase (APISO): 95 U/L (ref 37–153)
BUN: 24 mg/dL (ref 7–25)
CO2: 25 mmol/L (ref 20–32)
Calcium: 9.6 mg/dL (ref 8.6–10.4)
Chloride: 102 mmol/L (ref 98–110)
Creat: 0.74 mg/dL (ref 0.60–0.93)
GFR, Est African American: 94 mL/min/{1.73_m2} (ref >=60)
GFR, Est Non African American: 81 mL/min/{1.73_m2} (ref >=60)
Globulin: 3.1 g/dL (calc) (ref 1.9–3.7)
Glucose, Bld: 214 mg/dL — ABNORMAL HIGH (ref 65–139)
Potassium: 4.4 mmol/L (ref 3.5–5.3)
Sodium: 141 mmol/L (ref 135–146)
Total Bilirubin: 0.4 mg/dL (ref 0.2–1.2)
Total Protein: 6.9 g/dL (ref 6.1–8.1)

## 2020-02-23 ENCOUNTER — Other Ambulatory Visit: Payer: Self-pay | Admitting: Physician Assistant

## 2020-02-23 DIAGNOSIS — J4531 Mild persistent asthma with (acute) exacerbation: Secondary | ICD-10-CM

## 2020-02-23 DIAGNOSIS — R0602 Shortness of breath: Secondary | ICD-10-CM

## 2020-02-23 DIAGNOSIS — R600 Localized edema: Secondary | ICD-10-CM

## 2020-02-26 ENCOUNTER — Other Ambulatory Visit: Payer: Self-pay | Admitting: Physician Assistant

## 2020-02-26 DIAGNOSIS — R0602 Shortness of breath: Secondary | ICD-10-CM

## 2020-02-26 DIAGNOSIS — R600 Localized edema: Secondary | ICD-10-CM

## 2020-02-26 NOTE — Progress Notes (Signed)
Follow up in 1 month in office to recheck breathing and next steps.

## 2020-02-26 NOTE — Progress Notes (Signed)
Pt needs to get echo that was ordered done for sOB and swelling first.

## 2020-03-06 ENCOUNTER — Other Ambulatory Visit: Payer: Self-pay | Admitting: Neurology

## 2020-03-06 MED ORDER — BLOOD GLUCOSE METER KIT: PACK | 0 refills | Status: AC

## 2020-03-13 ENCOUNTER — Other Ambulatory Visit: Payer: Self-pay | Admitting: Neurology

## 2020-03-13 DIAGNOSIS — E11319 Type 2 diabetes mellitus with unspecified diabetic retinopathy without macular edema: Secondary | ICD-10-CM

## 2020-03-13 DIAGNOSIS — IMO0002 Reserved for concepts with insufficient information to code with codable children: Secondary | ICD-10-CM

## 2020-03-13 MED ORDER — GLIPIZIDE 10 MG PO TABS: 10.0000 mg | ORAL_TABLET | Freq: Two times a day (BID) | ORAL | 0 refills | Status: DC

## 2020-03-15 ENCOUNTER — Other Ambulatory Visit: Payer: Self-pay | Admitting: Physician Assistant

## 2020-03-24 ENCOUNTER — Other Ambulatory Visit: Payer: Self-pay | Admitting: Physician Assistant

## 2020-03-24 DIAGNOSIS — IMO0002 Reserved for concepts with insufficient information to code with codable children: Secondary | ICD-10-CM

## 2020-03-24 DIAGNOSIS — E11319 Type 2 diabetes mellitus with unspecified diabetic retinopathy without macular edema: Secondary | ICD-10-CM

## 2020-04-13 ENCOUNTER — Other Ambulatory Visit: Payer: Self-pay | Admitting: Physician Assistant

## 2020-04-13 DIAGNOSIS — IMO0002 Reserved for concepts with insufficient information to code with codable children: Secondary | ICD-10-CM

## 2020-04-13 DIAGNOSIS — E11319 Type 2 diabetes mellitus with unspecified diabetic retinopathy without macular edema: Secondary | ICD-10-CM

## 2020-04-26 ENCOUNTER — Other Ambulatory Visit: Payer: Self-pay | Admitting: Physician Assistant

## 2020-04-26 DIAGNOSIS — IMO0002 Reserved for concepts with insufficient information to code with codable children: Secondary | ICD-10-CM

## 2020-04-29 ENCOUNTER — Ambulatory Visit: Payer: Medicare Other | Admitting: Physician Assistant

## 2020-05-27 ENCOUNTER — Ambulatory Visit: Payer: Medicare Other | Admitting: Physician Assistant

## 2020-05-28 ENCOUNTER — Ambulatory Visit (INDEPENDENT_AMBULATORY_CARE_PROVIDER_SITE_OTHER): Payer: Medicare Other | Admitting: Physician Assistant

## 2020-05-28 DIAGNOSIS — Z5329 Procedure and treatment not carried out because of patient's decision for other reasons: Secondary | ICD-10-CM

## 2020-05-28 NOTE — Progress Notes (Signed)
No show/bad weather/snow/no charge.

## 2020-05-29 ENCOUNTER — Other Ambulatory Visit: Payer: Self-pay | Admitting: Physician Assistant

## 2020-05-29 DIAGNOSIS — J4531 Mild persistent asthma with (acute) exacerbation: Secondary | ICD-10-CM

## 2020-06-12 ENCOUNTER — Encounter: Payer: Self-pay | Admitting: Physician Assistant

## 2020-06-12 ENCOUNTER — Other Ambulatory Visit: Payer: Self-pay

## 2020-06-12 ENCOUNTER — Ambulatory Visit (INDEPENDENT_AMBULATORY_CARE_PROVIDER_SITE_OTHER): Payer: Medicare Other | Admitting: Physician Assistant

## 2020-06-12 VITALS — BP 164/73 | HR 98 | Ht 63.0 in | Wt 235.0 lb

## 2020-06-12 DIAGNOSIS — E785 Hyperlipidemia, unspecified: Secondary | ICD-10-CM | POA: Diagnosis not present

## 2020-06-12 DIAGNOSIS — I152 Hypertension secondary to endocrine disorders: Secondary | ICD-10-CM

## 2020-06-12 DIAGNOSIS — E1159 Type 2 diabetes mellitus with other circulatory complications: Secondary | ICD-10-CM | POA: Diagnosis not present

## 2020-06-12 DIAGNOSIS — I340 Nonrheumatic mitral (valve) insufficiency: Secondary | ICD-10-CM

## 2020-06-12 DIAGNOSIS — E1165 Type 2 diabetes mellitus with hyperglycemia: Secondary | ICD-10-CM | POA: Diagnosis not present

## 2020-06-12 DIAGNOSIS — J9611 Chronic respiratory failure with hypoxia: Secondary | ICD-10-CM

## 2020-06-12 DIAGNOSIS — E11319 Type 2 diabetes mellitus with unspecified diabetic retinopathy without macular edema: Secondary | ICD-10-CM

## 2020-06-12 DIAGNOSIS — R0602 Shortness of breath: Secondary | ICD-10-CM

## 2020-06-12 DIAGNOSIS — IMO0002 Reserved for concepts with insufficient information to code with codable children: Secondary | ICD-10-CM

## 2020-06-12 DIAGNOSIS — R6889 Other general symptoms and signs: Secondary | ICD-10-CM | POA: Diagnosis not present

## 2020-06-12 DIAGNOSIS — Z794 Long term (current) use of insulin: Secondary | ICD-10-CM

## 2020-06-12 DIAGNOSIS — J4530 Mild persistent asthma, uncomplicated: Secondary | ICD-10-CM

## 2020-06-12 LAB — POCT GLYCOSYLATED HEMOGLOBIN (HGB A1C): Hemoglobin A1C: 8.9 % — AB (ref 4.0–5.6)

## 2020-06-12 NOTE — Progress Notes (Signed)
Subjective:    Patient ID: Jamie Strickland, female    DOB: Jun 16, 1945, 75 y.o.   MRN: [id number]  HPI  Pt is a 75 yo female with HTN, chronic respiratory failure with hypoxia, asthma, T2DM, HLD who presents to the clinic for follow up and medication refills.   Pt's last a1c was 7.3. Jamie Strickland is taking metformin, glipizide, tresiba, humalog. Jamie Strickland states Jamie Strickland is taking medication. Jamie Strickland denies any open sores or wounds. Jamie Strickland denies any CP, palpitations. Her morning sugars have been worsening recently and 232 in am. Jamie Strickland is taking tresbia only in mornings.   Jamie Strickland continues to have persistent shortness or breath. Not on O2. Pulse ox is 94 percent. No cough. No lower ext edema. Last echo 2019 and EF looked great. Declined last echo ordered. On wixella. Has hx of asthma.   .. Active Ambulatory Problems    Diagnosis Date Noted  . Hyperlipidemia LDL goal <70 12/28/2007  . Allergic rhinitis 12/28/2007  . OSTEOARTHRITIS 12/28/2007  . COLONIC POLYPS, HX OF 12/28/2007  . Encounter for medication management 08/31/2011  . Uncontrolled type 2 diabetes mellitus with retinopathy, with long-term current use of insulin (HCC) 08/31/2011  . Obesity 02/29/2012  . Actinic keratosis 11/13/2013  . Mild nonproliferative diabetic retinopathy (HCC) 09/25/2014  . Medically noncompliant 02/20/2015  . Stroke-like symptom 10/04/2016  . Hypertension associated with type 2 diabetes mellitus (HCC) 11/13/2016  . Hyperlipidemia associated with type 2 diabetes mellitus (HCC) 12/30/2016  . Bilateral lower extremity edema 08/02/2017  . Dyspnea on exertion 08/02/2017  . History of echocardiogram 08/10/2017  . Mild mitral regurgitation 08/10/2017  . Mild persistent asthma without complication 12/10/2017  . Need for diphtheria-tetanus-pertussis (Tdap) vaccine 03/07/2018  . Chronic middle ear effusion, left 05/25/2018  . Uncontrolled stage 2 hypertension 08/24/2018  . At moderate risk for fall 09/16/2018  . Mild atherosclerosis of  carotid artery, right 11/22/2018  . 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/25/2018  . Shortness of breath 07/05/2019  . Anemia 07/07/2019  . Generalized abdominal pain 07/07/2019  . Black tarry stools 07/07/2019  . Chronic respiratory failure with hypoxia (HCC) 01/31/2020   Resolved Ambulatory Problems    Diagnosis Date Noted  . Essential hypertension, benign 12/28/2007  . Mild intermittent allergic asthma without complication 12/28/2007  . VAGINITIS 04/11/2010  . BACK PAIN 04/11/2010  . Cellulitis of buttock, right 01/22/2012  . Strain of knee and leg, right 09/06/2012  . Acute otalgia, left 12/10/2017   Past Medical History:  Diagnosis Date  . Allergic rhinitis   . Diabetes mellitus type II   . Hx of colonic polyps   . Hyperlipidemia   . Hypertension   . Osteoarthritis      Review of Systems  All other systems reviewed and are negative.      Objective:   Physical Exam Vitals reviewed.  Constitutional:      Appearance: Normal appearance.  HENT:     Head: Normocephalic.  Neck:     Vascular: No carotid bruit.  Cardiovascular:     Rate and Rhythm: Normal rate and regular rhythm.     Pulses: Normal pulses.  Pulmonary:     Breath sounds: Normal breath sounds.     Comments: Pursed breathing.  Chest:     Chest wall: No tenderness.  Musculoskeletal:     Right lower leg: No edema.     Left lower leg: No edema.  Lymphadenopathy:     Cervical: No cervical adenopathy.  Neurological:  General: No focal deficit present.     Mental Status: Jamie Strickland is alert and oriented to person, place, and time.  Psychiatric:        Mood and Affect: Mood normal.    .. Depression screen [facility] 2/9 05/25/2018 02/23/2018 11/13/2016 08/17/2016 08/19/2015  Decreased Interest 0 0 0 0 0  Down, Depressed, Hopeless 0 0 0 0 0  PHQ - 2 Score 0 0 0 0 0  Altered sleeping 2 - - - -  Tired, decreased energy 0 - - - -  Change in appetite 0 - - - -  Feeling bad or failure about yourself  0 - - - -   Trouble concentrating 0 - - - -  Moving slowly or fidgety/restless 0 - - - -  Suicidal thoughts 0 - - - -  PHQ-9 Score 2 - - - -           Lab Results  Component Value Date   HGBA1C 8.9 (A) 06/12/2020    Assessment & Plan:  Marland KitchenMarland KitchenShamyah was seen today for diabetes.  Diagnoses and all orders for this visit:  Uncontrolled type 2 diabetes mellitus with retinopathy, with long-term current use of insulin (HCC) -     POCT glycosylated hemoglobin (Hb A1C) -     Ambulatory referral to Endocrinology  SOB (shortness of breath) -     Ambulatory referral to Pulmonology  Chronic respiratory failure with hypoxia (HCC) -     Ambulatory referral to Pulmonology  Mild persistent asthma without complication -     Ambulatory referral to Pulmonology  Hypertension associated with type 2 diabetes mellitus (HCC)  Mild mitral regurgitation  Hyperlipidemia LDL goal <70   A1C worsened.  Jamie Strickland was almost to goal and then back up. Would like for her to see endocrinology.  Start tresiba 40 units in am and 25 units at bedtime. Ok to increase by 2 units every 5 days until 40 units at bedtime and/or fasting sugars under 120.  Continue to use humalog sliding scale. Continue glipizide and metformin.  On ACE. BP not to goal. Pt forgot to take medication this morning. Take medication and keep log of BP at home.  On lipitor.  Needs eye exam.  .. Diabetic Foot Exam - Simple   Simple Foot Form  06/12/2020  1:32 PM  Visual Inspection Sensation Testing Pulse Check Comments Patient refused diabetic foot exam    Pneumonia and flu shot UTD. Declines covid vaccine. Pt is aware of risk.   Persistent SOB- CT scan showed sub-acute hypersensitivity pneumonitis. Jamie Strickland is not on O2. Pulse ox stable but still not improving with breathing. Stop wixella and start Brevespi bid. Referral made to pulmonology.

## 2020-06-12 NOTE — Patient Instructions (Signed)
40 units of tresbia in morning and 25 units at bedtime.  humalog at each meal sliding scale.  Continue glipizide and metformin.

## 2020-06-17 ENCOUNTER — Encounter: Payer: Self-pay | Admitting: Physician Assistant

## 2020-06-17 ENCOUNTER — Other Ambulatory Visit: Payer: Self-pay | Admitting: Neurology

## 2020-06-17 DIAGNOSIS — IMO0002 Reserved for concepts with insufficient information to code with codable children: Secondary | ICD-10-CM

## 2020-06-17 DIAGNOSIS — E11319 Type 2 diabetes mellitus with unspecified diabetic retinopathy without macular edema: Secondary | ICD-10-CM

## 2020-06-17 MED ORDER — BD PEN NEEDLE NANO U/F 32G X 4 MM MISC: 2 refills | Status: DC

## 2020-06-18 ENCOUNTER — Other Ambulatory Visit: Payer: Self-pay | Admitting: Physician Assistant

## 2020-06-18 DIAGNOSIS — J4531 Mild persistent asthma with (acute) exacerbation: Secondary | ICD-10-CM

## 2020-06-28 ENCOUNTER — Ambulatory Visit (INDEPENDENT_AMBULATORY_CARE_PROVIDER_SITE_OTHER): Payer: Medicare Other | Admitting: Physician Assistant

## 2020-06-28 DIAGNOSIS — Z0000 Encounter for general adult medical examination without abnormal findings: Secondary | ICD-10-CM | POA: Diagnosis not present

## 2020-06-28 NOTE — Patient Instructions (Signed)
Diabetes Mellitus and Foot Care Foot care is an important part of your health, especially when you have diabetes. Diabetes may cause you to have problems because of poor blood flow (circulation) to your feet and legs, which can cause your skin to:  Become thinner and drier.  Break more easily.  Heal more slowly.  Peel and crack. You may also have nerve damage (neuropathy) in your legs and feet, causing decreased feeling in them. This means that you may not notice minor injuries to your feet that could lead to more serious problems. Noticing and addressing any potential problems early is the best way to prevent future foot problems. How to care for your feet Foot hygiene  Wash your feet daily with warm water and mild soap. Do not use hot water. Then, pat your feet and the areas between your toes until they are completely dry. Do not soak your feet as this can dry your skin.  Trim your toenails straight across. Do not dig under them or around the cuticle. File the edges of your nails with an emery board or nail file.  Apply a moisturizing lotion or petroleum jelly to the skin on your feet and to dry, brittle toenails. Use lotion that does not contain alcohol and is unscented. Do not apply lotion between your toes.   Shoes and socks  Wear clean socks or stockings every day. Make sure they are not too tight. Do not wear knee-high stockings since they may decrease blood flow to your legs.  Wear shoes that fit properly and have enough cushioning. Always look in your shoes before you put them on to be sure there are no objects inside.  To break in new shoes, wear them for just a few hours a day. This prevents injuries on your feet. Wounds, scrapes, corns, and calluses  Check your feet daily for blisters, cuts, bruises, sores, and redness. If you cannot see the bottom of your feet, use a mirror or ask someone for help.  Do not cut corns or calluses or try to remove them with medicine.  If  you find a minor scrape, cut, or break in the skin on your feet, keep it and the skin around it clean and dry. You may clean these areas with mild soap and water. Do not clean the area with peroxide, alcohol, or iodine.  If you have a wound, scrape, corn, or callus on your foot, look at it several times a day to make sure it is healing and not infected. Check for: ? Redness, swelling, or pain. ? Fluid or blood. ? Warmth. ? Pus or a bad smell.   General tips  Do not cross your legs. This may decrease blood flow to your feet.  Do not use heating pads or hot water bottles on your feet. They may burn your skin. If you have lost feeling in your feet or legs, you may not know this is happening until it is too late.  Protect your feet from hot and cold by wearing shoes, such as at the beach or on hot pavement.  Schedule a complete foot exam at least once a year (annually) or more often if you have foot problems. Report any cuts, sores, or bruises to your health care provider immediately. Where to find more information  American Diabetes Association: www.diabetes.org  Association of Diabetes Care & Education Specialists: www.diabeteseducator.org Contact a health care provider if:  You have a medical condition that increases your risk of infection  and you have any cuts, sores, or bruises on your feet.  You have an injury that is not healing.  You have redness on your legs or feet.  You feel burning or tingling in your legs or feet.  You have pain or cramps in your legs and feet.  Your legs or feet are numb.  Your feet always feel cold.  You have pain around any toenails. Get help right away if:  You have a wound, scrape, corn, or callus on your foot and: ? You have pain, swelling, or redness that gets worse. ? You have fluid or blood coming from the wound, scrape, corn, or callus. ? Your wound, scrape, corn, or callus feels warm to the touch. ? You have pus or a bad smell coming  from the wound, scrape, corn, or callus. ? You have a fever. ? You have a red line going up your leg. Summary  Check your feet every day for blisters, cuts, bruises, sores, and redness.  Apply a moisturizing lotion or petroleum jelly to the skin on your feet and to dry, brittle toenails.  Wear shoes that fit properly and have enough cushioning.  If you have foot problems, report any cuts, sores, or bruises to your health care provider immediately.  Schedule a complete foot exam at least once a year (annually) or more often if you have foot problems. This information is not intended to replace advice given to you by your health care provider. Make sure you discuss any questions you have with your health care provider. Document Revised: 11/16/2019 Document Reviewed: 11/16/2019 Elsevier Patient Education  [place] Maintenance, Female Adopting a healthy lifestyle and getting preventive care are important in promoting health and wellness. Ask your health care provider about:  The right schedule for you to have regular tests and exams.  Things you can do on your own to prevent diseases and keep yourself healthy. What should I know about diet, weight, and exercise? Eat a healthy diet  Eat a diet that includes plenty of vegetables, fruits, low-fat dairy products, and lean protein.  Do not eat a lot of foods that are high in solid fats, added sugars, or sodium.   Maintain a healthy weight Body mass index (BMI) is used to identify weight problems. It estimates body fat based on height and weight. Your health care provider can help determine your BMI and help you achieve or maintain a healthy weight. Get regular exercise Get regular exercise. This is one of the most important things you can do for your health. Most adults should:  Exercise for at least 150 minutes each week. The exercise should increase your heart rate and make you sweat (moderate-intensity  exercise).  Do strengthening exercises at least twice a week. This is in addition to the moderate-intensity exercise.  Spend less time sitting. Even light physical activity can be beneficial. Watch cholesterol and blood lipids Have your blood tested for lipids and cholesterol at 73 years of age, then have this test every 5 years. Have your cholesterol levels checked more often if:  Your lipid or cholesterol levels are high.  You are older than 73 years of age.  You are at high risk for heart disease. What should I know about cancer screening? Depending on your health history and family history, you may need to have cancer screening at various ages. This may include screening for:  Breast cancer.  Cervical cancer.  Colorectal cancer.  Skin cancer.  Lung cancer. What should I know about heart disease, diabetes, and high blood pressure? Blood pressure and heart disease  High blood pressure causes heart disease and increases the risk of stroke. This is more likely to develop in people who have high blood pressure readings, are of African descent, or are overweight.  Have your blood pressure checked: ? Every 3-5 years if you are 63-108 years of age. ? Every year if you are 57 years old or older. Diabetes Have regular diabetes screenings. This checks your fasting blood sugar level. Have the screening done:  Once every three years after age 66 if you are at a normal weight and have a low risk for diabetes.  More often and at a younger age if you are overweight or have a high risk for diabetes. What should I know about preventing infection? Hepatitis B If you have a higher risk for hepatitis B, you should be screened for this virus. Talk with your health care provider to find out if you are at risk for hepatitis B infection. Hepatitis C Testing is recommended for:  Everyone born from [birth year] through 1963.  Anyone with known risk factors for hepatitis C. Sexually transmitted  infections (STIs)  Get screened for STIs, including gonorrhea and chlamydia, if: ? You are sexually active and are younger than 73 years of age. ? You are older than 73 years of age and your health care provider tells you that you are at risk for this type of infection. ? Your sexual activity has changed since you were last screened, and you are at increased risk for chlamydia or gonorrhea. Ask your health care provider if you are at risk.  Ask your health care provider about whether you are at high risk for HIV. Your health care provider may recommend a prescription medicine to help prevent HIV infection. If you choose to take medicine to prevent HIV, you should first get tested for HIV. You should then be tested every 3 months for as long as you are taking the medicine. Pregnancy  If you are about to stop having your period (premenopausal) and you may become pregnant, seek counseling before you get pregnant.  Take 400 to 800 micrograms (mcg) of folic acid every day if you become pregnant.  Ask for birth control (contraception) if you want to prevent pregnancy. Osteoporosis and menopause Osteoporosis is a disease in which the bones lose minerals and strength with aging. This can result in bone fractures. If you are 33 years old or older, or if you are at risk for osteoporosis and fractures, ask your health care provider if you should:  Be screened for bone loss.  Take a calcium or vitamin D supplement to lower your risk of fractures.  Be given hormone replacement therapy (HRT) to treat symptoms of menopause. Follow these instructions at home: Lifestyle  Do not use any products that contain nicotine or tobacco, such as cigarettes, e-cigarettes, and chewing tobacco. If you need help quitting, ask your health care provider.  Do not use street drugs.  Do not share needles.  Ask your health care provider for help if you need support or information about quitting drugs. Alcohol use  Do  not drink alcohol if: ? Your health care provider tells you not to drink. ? You are pregnant, may be pregnant, or are planning to become pregnant.  If you drink alcohol: ? Limit how much you use to 0-1 drink a day. ? Limit intake if you are breastfeeding.  Be aware of how much alcohol is in your drink. In the [place] one drink equals one 12 oz bottle of beer (355 mL), one 5 oz glass of wine (148 mL), or one 1 oz glass of hard liquor (44 mL). General instructions  Schedule regular health, dental, and eye exams.  Stay current with your vaccines.  Tell your health care provider if: ? You often feel depressed. ? You have ever been abused or do not feel safe at home. Summary  Adopting a healthy lifestyle and getting preventive care are important in promoting health and wellness.  Follow your health care provider's instructions about healthy diet, exercising, and getting tested or screened for diseases.  Follow your health care provider's instructions on monitoring your cholesterol and blood pressure. This information is not intended to replace advice given to you by your health care provider. Make sure you discuss any questions you have with your health care provider. Document Revised: 04/20/2018 Document Reviewed: 04/20/2018 Elsevier Patient Education  2021 [employer].

## 2020-06-28 NOTE — Progress Notes (Signed)
Marland Kitchen   MEDICARE ANNUAL WELLNESS VISIT  06/28/2020  Telephone Visit Disclaimer This Medicare AWV was conducted by telephone due to national recommendations for restrictions regarding the COVID-19 Pandemic (e.g. social distancing).  I verified, using two identifiers, that I am speaking with Jamie Strickland or their authorized healthcare agent. I discussed the limitations, risks, security, and privacy concerns of performing an evaluation and management service by telephone and the potential availability of an in-person appointment in the future. The patient expressed understanding and agreed to proceed.  Location of Patient: Home Location of Provider (nurse):  In the office.  Subjective:    Jamie Strickland is a 75 y.o. female patient of Jamie Strickland who had a Medicare Annual Wellness Visit today via telephone. Jamie Strickland is Retired and lives with their son. she has 2 children. she reports that she is socially active and does interact with friends/family regularly. she is minimally physically active and enjoys crochet and quilt.  Patient Care Team: Jamie Strickland as PCP - General (Family Medicine)  Advanced Directives 06/28/2020 08/08/2019 10/04/2016 08/17/2016 05/15/2016  Does Patient Have a Medical Advance Directive? Yes No No Yes No  Type of Paramedic of [place];Living will - - [place];Living will -  Does patient want to make changes to medical advance directive? No - Patient declined - - No - Patient declined -  Copy of [place] in Chart? No - copy requested - - No - copy requested -  Would patient like information on creating a medical advance directive? - No - Patient declined No - Patient declined - No - Patient declined    Hospital Utilization Over the Past 12 Months: # of hospitalizations or ER visits: 1 # of surgeries: 0  Review of Systems    Patient reports that her overall health is better compared to last  year.  History obtained from chart review and the patient  Patient Reported Readings (BP, Pulse, CBG, Weight, etc) CBG 218  Pain Assessment Pain : No/denies pain     Current Medications & Allergies (verified) Allergies as of 06/28/2020      Reactions   Invokana [canagliflozin] Nausea And Vomiting, Other (See Comments)   Yeast infections   Trulicity [dulaglutide]    nausea   Codeine Nausea And Vomiting   REACTION: n /v      Medication List       Accurate as of June 28, 2020  4:32 PM. If you have any questions, ask your nurse or doctor.        Accu-Chek Guide test strip Generic drug: glucose blood TEST UP TO 4 TIMES DAILY AS DIRECTED   albuterol 108 (90 Base) MCG/ACT inhaler Commonly known as: ProAir HFA INHALE 1-2 PUFFS INTO THE LUNGS EVERY 4 (FOUR) HOURS AS NEEDED FOR WHEEZING OR SHORTNESS OF BREATH   albuterol (2.5 MG/3ML) 0.083% nebulizer solution Commonly known as: PROVENTIL Please specify directions, refills and quantity   amLODipine 10 MG tablet Commonly known as: NORVASC Take 1 tablet (10 mg total) by mouth daily.   aspirin EC 81 MG tablet Take 81 mg by mouth daily.   atorvastatin 20 MG tablet Commonly known as: LIPITOR TAKE 1 TABLET BY MOUTH EVERY DAY   Atrovent HFA 17 MCG/ACT inhaler Generic drug: ipratropium INHALE 2-3 PUFFS INTO THE LUNGS EVERY 6 HOURS AS NEEDED FOR WHEEZING.   BD Insulin Syringe U/F 31G X 5/16" 0.5 ML Misc Generic drug: Insulin Syringe-Needle U-100 USE  AS DIRECTED   BD Pen Needle Nano U/F 32G X 4 MM Misc Generic drug: Insulin Pen Needle For use twice daily with insulin pen   blood glucose meter kit and supplies Dispense based on patient and insurance preference. Use up to four times daily as directed. (FOR ICD-10 E10.9, E11.9).   Breztri Aerosphere 160-9-4.8 MCG/ACT Aero Generic drug: Budeson-Glycopyrrol-Formoterol Inhale 2 puffs into the lungs in the morning and at bedtime.   carvedilol 12.5 MG tablet Commonly  known as: COREG Take 1 tablet (12.5 mg total) by mouth 2 (two) times daily with a meal.   chlorthalidone 25 MG tablet Commonly known as: HYGROTON Take 1 tablet (25 mg total) by mouth daily.   clopidogrel 75 MG tablet Commonly known as: PLAVIX Take 1 tablet (75 mg total) by mouth daily.   fluticasone 50 MCG/ACT nasal spray Commonly known as: FLONASE SPRAY 2 SPRAYS INTO EACH NOSTRIL EVERY DAY   glipiZIDE 10 MG tablet Commonly known as: GLUCOTROL Take 1 tablet (10 mg total) by mouth 2 (two) times daily before a meal.   insulin lispro 100 UNIT/ML injection Commonly known as: HumaLOG INJECT 25 UNITS INTO THE SKIN 3 TIMES DAILY BEFORE MEALS   levocetirizine 5 MG tablet Commonly known as: XYZAL Take 1 tablet (5 mg total) by mouth every evening.   lisinopril 40 MG tablet Commonly known as: ZESTRIL Take 1 tablet (40 mg total) by mouth daily.   metFORMIN 500 MG 24 hr tablet Commonly known as: GLUCOPHAGE-XR Take 2 tablets (1,000 mg total) by mouth 2 (two) times daily with a meal.   montelukast 10 MG tablet Commonly known as: SINGULAIR Take 1 tablet (10 mg total) by mouth at bedtime.   Jamie Strickland FlexTouch 200 UNIT/ML FlexTouch Pen Generic drug: insulin degludec INJECT 56 UNITS EVERY MORNING. SELF-TITRATE BY 2 UNITS EVERY 3 DAYS   Wixela Inhub 250-50 MCG/DOSE Aepb Generic drug: Fluticasone-Salmeterol TAKE 1 PUFF BY MOUTH TWICE A DAY       History (reviewed): Past Medical History:  Diagnosis Date  . Allergic rhinitis   . Diabetes mellitus type II   . Hx of colonic polyps   . Hyperlipidemia   . Hypertension   . Mild atherosclerosis of carotid artery, right 11/22/2018  . Mild intermittent allergic asthma without complication 3/00/7622   Qualifier: Diagnosis of  By: Jamie Pate MD, Jamie Strickland   . Mild mitral regurgitation 08/10/2017  . Mild nonproliferative diabetic retinopathy ([place]) 09/25/2014   Dxed Dr Thelma Comp, 09/11/2014.   Changes noted in bilateral eyes?   .  Osteoarthritis    Past Surgical History:  Procedure Laterality Date  . 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 CATARACT EXTRACTION W/ INTRAOCULAR LENS  IMPLANT, BILATERAL Bilateral 2014  . CATARACT EXTRACTION W/ INTRAOCULAR LENS  IMPLANT, BILATERAL  11/14  . CHOLECYSTECTOMY    . LASIK     Family History  Problem Relation Age of Onset  . Diabetes Mother   . Heart disease Mother   . Diabetes Father        brittle diabetes  . Asthma Father 65  . Liver disease Father        liver problems  . Heart disease Father   . Heart disease Brother        CAD  . Strickland Maternal Aunt        Breast  . Heart disease Maternal Grandfather        CAD  . Strickland Brother  lung   Social History   Socioeconomic History  . Marital status: Divorced    Spouse name: Not on file  . Number of children: 2  . Years of education: Associates Degree  . Highest education level: Associate degree: academic program  Occupational History  . Occupation: LPN    Comment: on nights at [employer]  . Occupation: Retired  Tobacco Use  . Smoking status: Former Research scientist (life sciences)  . Smokeless tobacco: Former Systems developer    Quit date: 08/09/2008  Substance and Sexual Activity  . Alcohol use: Yes    Alcohol/week: 0.0 standard drinks    Comment: Rare  . Drug use: No  . Sexual activity: Not Currently  Other Topics Concern  . Not on file  Social History Narrative   No living will   Son, Nicki Reaper, is health care POA   2 sons- Rodman Key and Nicki Reaper [phone] and 14)    4 grandchildren and 6 great-grandchildren   1 dog lives inside   Right handed    Caffeine- 1 soda-diet, 1 cup of coffee   Discussed DNR and she wants this. Done 10/17/12   No tube feeds if cognitively unaware   Social Determinants of Health   Financial Resource Strain: Not on file  Food Insecurity: Not on file  Transportation Needs: No Transportation Needs  . Lack of Transportation (Medical): No  . Lack of Transportation  (Non-Medical): No  Physical Activity: Sufficiently Active  . Days of Exercise per Week: 6 days  . Minutes of Exercise per Session: 30 min  Stress: Not on file  Social Connections: Not on file    Activities of Daily Living In your present state of health, do you have any difficulty performing the following activities: 06/28/2020  Hearing? N  Vision? N  Difficulty concentrating or making decisions? N  Walking or climbing stairs? N  Dressing or bathing? N  Doing errands, shopping? N  Preparing Food and eating ? N  Using the Toilet? N  In the past six months, have you accidently leaked urine? Y  Comment when sneezes  Do you have problems with loss of bowel control? N  Managing your Medications? N  Managing your Finances? N  Housekeeping or managing your Housekeeping? N  Some recent data might be hidden    Patient Education/ Literacy How often do you need to have someone help you when you read instructions, pamphlets, or other written materials from your doctor or pharmacy?: 1 - Never What is the last grade level you completed in school?: Associates Degree  Exercise Current Exercise Habits: Home exercise routine, Type of exercise: walking, Time (Minutes): 30, Frequency (Times/Week): 6, Weekly Exercise (Minutes/Week): 180, Intensity: Mild, Exercise limited by: None identified  Diet Patient reports consuming 2-3 meals a day and 0 snack(s) a day Patient reports that her primary diet is: Regular Patient reports that she does have regular access to food.   Depression Screen PHQ 2/9 Scores 06/28/2020 05/25/2018 02/23/2018 11/13/2016 08/17/2016 08/19/2015  PHQ - 2 Score 0 0 0 0 0 0  PHQ- 9 Score 0 2 - - - -     Fall Risk Fall Risk  06/28/2020 09/09/2018 05/25/2018 02/23/2018 08/17/2016  Falls in the past year? 1 1 0 No No  Number falls in past yr: 0 0 0 - -  Injury with Fall? 1 1 - - -  Risk for fall due to : Impaired balance/gait History of fall(s) - - -  Follow up Falls evaluation  completed;Education provided;Falls prevention discussed - - - -  Objective:  Jamie Strickland seemed alert and oriented and she participated appropriately during our telephone visit.  Blood Pressure Weight BMI  BP Readings from Last 3 Encounters:  06/12/20 (!) 164/73  01/29/20 (!) 145/82  10/02/19 (!) 164/78   Wt Readings from Last 3 Encounters:  06/12/20 235 lb (106.6 kg)  01/29/20 237 lb (107.5 kg)  10/02/19 226 lb (102.5 kg)   BMI Readings from Last 1 Encounters:  06/12/20 41.63 kg/m    *Unable to obtain current vital signs, weight, and BMI due to telephone visit type  Hearing/Vision  . Jemina did not seem to have difficulty with hearing/understanding during the telephone conversation . Reports that she has not had a formal eye exam by an eye care professional within the past year . Reports that she has not had a formal hearing evaluation within the past year *Unable to fully assess hearing and vision during telephone visit type  Cognitive Function: 6CIT Screen 06/28/2020  What Year? 0 points  What month? 0 points  What time? 0 points  Count back from 20 0 points  Months in reverse (No Data)  Repeat phrase (No Data)   (Normal:0-7, Significant for Dysfunction: >8)  . Normal Cognitive Function Screening: Unable to determine: Unable to complete exam as patient hung up before the 6 CIT, social determinants section or the health maintenance was completed.    Immunization & Health Maintenance Record Immunization History  Administered Date(s) Administered  . Influenza Split 02/19/2012, 03/11/2014  . Influenza Whole 03/04/2009, 03/03/2010  . Influenza, High Dose Seasonal PF 03/28/2018, 01/11/2019, 01/18/2020  . Influenza-Unspecified 01/17/2016  . Pneumococcal Conjugate-13 11/16/2014  . Pneumococcal Polysaccharide-23 05/11/2004, 02/08/2013  . Td 12/28/2007  . Zoster 08/31/2011    Health Maintenance  Topic Date Due  . OPHTHALMOLOGY EXAM  11/02/2019  . COVID-19  Vaccine (1) 06/28/2020 (Originally 05/31/1957)  . FOOT EXAM  06/12/2021 (Originally 06/06/2020)  . DEXA SCAN  06/12/2021 (Originally 05/30/2012)  . TETANUS/TDAP  06/12/2021 (Originally 12/27/2017)  . HEMOGLOBIN A1C  12/10/2020  . MAMMOGRAM  05/24/2021  . COLONOSCOPY (Pts 45-36yr Insurance coverage will need to be confirmed)  09/02/2024  . INFLUENZA VACCINE  Completed  . Hepatitis C Screening  Completed  . PNA vac Low Risk Adult  Completed       Assessment  This is a routine wellness examination for HSherlon Strickland  Health Maintenance: Due or Overdue Health Maintenance Due  Topic Date Due  . OPHTHALMOLOGY EXAM  11/02/2019   . Unable to complete exam as patient hung up before the 6 CIT, social determinants section or the health maintenance was completed.    HSherlon Handingdoes not need a referral for Community Assistance: Care Management:   no Social Work:    no Prescription Assistance:  no Nutrition/Diabetes Education:  no   Plan:  Personalized Goals Goals Addressed              This Visit's Progress   .  Patient Stated (pt-stated)        06/28/2020 AWV Goal: Diabetes Management  . Patient will maintain an A1C level below 8.0 . Patient will not develop any diabetic foot complications . Patient will not experience any hypoglycemic episodes over the next 3 months . Patient will notify our office of any CBG readings outside of the provider recommended range by calling [phone]. Patient will adhere to provider recommendations for diabetes management  Patient Self Management Activities . take all medications as prescribed and report any  negative side effects . monitor and record blood sugar readings as directed . adhere to a low carbohydrate diet that incorporates lean proteins, vegetables, whole grains, low glycemic fruits . check feet daily noting any sores, cracks, injuries, or callous formations . see PCP or podiatrist if she notices any changes in her legs,  feet, or toenails . Patient will visit PCP and have an A1C level checked every 3 to 6 months as directed  . have a yearly eye exam to monitor for vascular changes associated with diabetes and will request that the report be sent to her pcp.  . consult with her PCP regarding any changes in her health or new or worsening symptoms       Personalized Health Maintenance & Screening Recommendations  Td vaccine Bone densitometry screening Shingrix  Lung Strickland Screening Recommended: no (Low Dose CT Chest recommended if Age 62-80 years, 30 pack-year currently smoking OR have quit w/in past 15 years) Hepatitis C Screening recommended: no HIV Screening recommended: yes  Advanced Directives: Written information was not prepared per patient's request.  Referrals & Orders No orders of the defined types were placed in this encounter.   Follow-up Plan . Follow-up with Donella Stade, Strickland as planned . Unable to complete exam as patient hung up before the 6 CIT, social determinants section or the health maintenance was completed.   I have personally reviewed and noted the following in the patient's chart:   . Medical and social history . Use of alcohol, tobacco or illicit drugs  . Current medications and supplements . Functional ability and status . Nutritional status . Physical activity . Advanced directives . List of other physicians . Hospitalizations, surgeries, and ER visits in previous 12 months . Vitals . Screenings to include cognitive, depression, and falls . Referrals and appointments  In addition, I have reviewed and discussed with Jamie Strickland certain preventive protocols, quality metrics, and best practice recommendations. A written personalized care plan for preventive services as well as general preventive health recommendations is available and can be mailed to the patient at her request.      Tinnie Gens, RN  06/28/2020

## 2020-07-08 ENCOUNTER — Other Ambulatory Visit: Payer: Self-pay

## 2020-07-08 ENCOUNTER — Ambulatory Visit: Payer: Medicare Other | Admitting: Emergency Medicine

## 2020-07-08 ENCOUNTER — Other Ambulatory Visit: Payer: Self-pay | Admitting: Physician Assistant

## 2020-07-08 ENCOUNTER — Encounter: Payer: Self-pay | Admitting: Emergency Medicine

## 2020-07-08 DIAGNOSIS — J3089 Other allergic rhinitis: Secondary | ICD-10-CM | POA: Diagnosis not present

## 2020-07-08 DIAGNOSIS — J4530 Mild persistent asthma, uncomplicated: Secondary | ICD-10-CM

## 2020-07-08 DIAGNOSIS — J9611 Chronic respiratory failure with hypoxia: Secondary | ICD-10-CM | POA: Diagnosis not present

## 2020-07-08 DIAGNOSIS — R6889 Other general symptoms and signs: Secondary | ICD-10-CM | POA: Diagnosis not present

## 2020-07-08 DIAGNOSIS — E11319 Type 2 diabetes mellitus with unspecified diabetic retinopathy without macular edema: Secondary | ICD-10-CM

## 2020-07-08 DIAGNOSIS — R9389 Abnormal findings on diagnostic imaging of other specified body structures: Secondary | ICD-10-CM | POA: Diagnosis not present

## 2020-07-08 DIAGNOSIS — IMO0002 Reserved for concepts with insufficient information to code with codable children: Secondary | ICD-10-CM

## 2020-07-08 NOTE — Progress Notes (Signed)
Subjective:    Patient ID: Jamie Strickland, female    DOB: Jan 08, 1946, 75 y.o.   MRN: [id number]  HPI 75 year old former smoker with a history of diabetes, hypertension, mitral regurgitation, allergic rhinitis.  She was diagnosed with asthma in childhood. Has had it ever since.  She hears wheeze with some activity. Sensitive to cold air, sensitive to pollens, grass. Spring is her worst time.  Formerly on immunotherapy, no longer. Minimal cough, not every day. Some nasal mucous every day, worst during the spring   Currently managed on Singulair, Breztri. Also may be taking wixella every morning.  She has Atrovent and albuterol both available to use as needed. She is using albuterol at least once a day.  Flonase, Xyzal On lisinopril  CT chest 02/21/20 reviewed by me, possible mild centrilobular groundglass (motion degraded) that is upper lobe predominant.  Question bronchiolitis, question air-trapping.   Review of Systems As per HPI  Past Medical History:  Diagnosis Date  . Allergic rhinitis   . Diabetes mellitus type II   . Hx of colonic polyps   . Hyperlipidemia   . Hypertension   . Mild atherosclerosis of carotid artery, right 11/22/2018  . Mild intermittent allergic asthma without complication 7/42/5956   Qualifier: Diagnosis of  By: Silvio Pate MD, [facility]   . Mild mitral regurgitation 08/10/2017  . Mild nonproliferative diabetic retinopathy ([place]) 09/25/2014   Dxed Dr Thelma Comp, 09/11/2014.   Changes noted in bilateral eyes?   . Osteoarthritis      Family History  Problem Relation Age of Onset  . Diabetes Mother   . Heart disease Mother   . Diabetes Father        brittle diabetes  . Asthma Father 71  . Liver disease Father        liver problems  . Heart disease Father   . Heart disease Brother        CAD  . Cancer Maternal Aunt        Breast  . Heart disease Maternal Grandfather        CAD  . Cancer Brother        lung     Social History   Socioeconomic  History  . Marital status: Divorced    Spouse name: Not on file  . Number of children: 2  . Years of education: Associates Degree  . Highest education level: Associate degree: academic program  Occupational History  . Occupation: LPN    Comment: on nights at [employer]  . Occupation: Retired  Tobacco Use  . Smoking status: Former Smoker    Packs/day: 0.50    Years: 50.00    Pack years: 25.00    Types: Cigarettes  . Smokeless tobacco: Former Systems developer    Quit date: 08/09/2008  Substance and Sexual Activity  . Alcohol use: Yes    Alcohol/week: 0.0 standard drinks    Comment: Rare  . Drug use: No  . Sexual activity: Not Currently  Other Topics Concern  . Not on file  Social History Narrative   No living will   Son, Nicki Reaper, is health care POA   2 sons- Rodman Key and Nicki Reaper [phone] and 4)    4 grandchildren and 6 great-grandchildren   1 dog lives inside   Right handed    Caffeine- 1 soda-diet, 1 cup of coffee   Discussed DNR and she wants this. Done 10/17/12   No tube feeds if cognitively unaware   Social Determinants of Health  Financial Resource Strain: Not on file  Food Insecurity: Not on file  Transportation Needs: No Transportation Needs  . Lack of Transportation (Medical): No  . Lack of Transportation (Non-Medical): No  Physical Activity: Sufficiently Active  . Days of Exercise per Week: 6 days  . Minutes of Exercise per Session: 30 min  Stress: Not on file  Social Connections: Not on file  Intimate Partner Violence: Not on file    Has lived in [place], [place], [place], [place], [place] Retired Marine scientist.   Allergies  Allergen Reactions  . Invokana [Canagliflozin] Nausea And Vomiting and Other (See Comments)    Yeast infections  . Trulicity [Dulaglutide]     nausea  . Codeine Nausea And Vomiting    REACTION: n /v     Outpatient Medications Prior to Visit  Medication Sig Dispense Refill  . ACCU-CHEK GUIDE test strip TEST UP TO 4 TIMES DAILY AS DIRECTED 100 strip 3  . albuterol (PROAIR HFA)  108 (90 Base) MCG/ACT inhaler INHALE 1-2 PUFFS INTO THE LUNGS EVERY 4 (FOUR) HOURS AS NEEDED FOR WHEEZING OR SHORTNESS OF BREATH 16 g 0  . albuterol (PROVENTIL) (2.5 MG/3ML) 0.083% nebulizer solution Please specify directions, refills and quantity 75 mL 0  . amLODipine (NORVASC) 10 MG tablet Take 1 tablet (10 mg total) by mouth daily. 90 tablet 1  . aspirin EC 81 MG tablet Take 81 mg by mouth daily.    Marland Kitchen atorvastatin (LIPITOR) 20 MG tablet TAKE 1 TABLET BY MOUTH EVERY DAY 90 tablet 3  . ATROVENT HFA 17 MCG/ACT inhaler INHALE 2-3 PUFFS INTO THE LUNGS EVERY 6 HOURS AS NEEDED FOR WHEEZING. 12.9 each 2  . BD INSULIN SYRINGE U/F 31G X 5/16" 0.5 ML MISC USE AS DIRECTED 100 each 1  . blood glucose meter kit and supplies Dispense based on patient and insurance preference. Use up to four times daily as directed. (FOR ICD-10 E10.9, E11.9). 1 each 0  . Budeson-Glycopyrrol-Formoterol (BREZTRI AEROSPHERE) 160-9-4.8 MCG/ACT AERO Inhale 2 puffs into the lungs in the morning and at bedtime. 10.7 g 2  . carvedilol (COREG) 12.5 MG tablet Take 1 tablet (12.5 mg total) by mouth 2 (two) times daily with a meal. 180 tablet 1  . chlorthalidone (HYGROTON) 25 MG tablet Take 1 tablet (25 mg total) by mouth daily. 90 tablet 1  . clopidogrel (PLAVIX) 75 MG tablet Take 1 tablet (75 mg total) by mouth daily. 90 tablet 1  . fluticasone (FLONASE) 50 MCG/ACT nasal spray SPRAY 2 SPRAYS INTO EACH NOSTRIL EVERY DAY 48 mL 2  . glipiZIDE (GLUCOTROL) 10 MG tablet Take 1 tablet (10 mg total) by mouth 2 (two) times daily before a meal. 180 tablet 0  . insulin lispro (HUMALOG) 100 UNIT/ML injection INJECT 25 UNITS INTO THE SKIN 3 TIMES DAILY BEFORE MEALS 20 mL 10  . Insulin Pen Needle (BD PEN NEEDLE NANO U/F) 32G X 4 MM MISC For use twice daily with insulin pen 100 each 2  . levocetirizine (XYZAL) 5 MG tablet Take 1 tablet (5 mg total) by mouth every evening. 90 tablet 3  . lisinopril (ZESTRIL) 40 MG tablet Take 1 tablet (40 mg total) by  mouth daily. 90 tablet 1  . metFORMIN (GLUCOPHAGE-XR) 500 MG 24 hr tablet Take 2 tablets (1,000 mg total) by mouth 2 (two) times daily with a meal. 360 tablet 1  . montelukast (SINGULAIR) 10 MG tablet Take 1 tablet (10 mg total) by mouth at bedtime. 90 tablet 3  . TRESIBA FLEXTOUCH  200 UNIT/ML FlexTouch Pen INJECT 56 UNITS EVERY MORNING. SELF-TITRATE BY 2 UNITS EVERY 3 DAYS 12 mL 0  . WIXELA INHUB 250-50 MCG/DOSE AEPB TAKE 1 PUFF BY MOUTH TWICE A DAY 180 each 2   No facility-administered medications prior to visit.         Objective:   Physical Exam Vitals:   07/08/20 1506  BP: (!) 142/86  Pulse: 89  Temp: (!) 97.4 F (36.3 C)  TempSrc: Temporal  SpO2: 97%  Weight: 234 lb 6.4 oz (106.3 kg)  Height: '5\' 3"'  (1.6 m)   Gen: Pleasant, obese woman, in no distress,  Normal affect  ENT: No lesions,  mouth clear,  oropharynx dry but clear, no postnasal drip  Neck: No JVD, no stridor  Lungs: No use of accessory muscles, small breaths, no crackles or wheezing on normal respiration, no wheeze on forced expiration  Cardiovascular: RRR, heart sounds normal, no murmur or gallops, no peripheral edema  Musculoskeletal: No deformities, no cyanosis or clubbing  Neuro: alert, awake, non focal  Skin: Warm, no lesions or rash      Assessment & Plan:  Mild persistent asthma without complication Her dyspnea, wheeze.  To be intermittent, trigger related.  She does not know which maintenance inhaler she is on.  According to her PCP notes her [place] was recently changed to [place].  It sounds like she is still using the Wixela, possibly the [place] as well.  She is using albuterol (or some rescue because Atrovent is also on her med list) about once a day.  Her worst time of the year is in the spring due to allergy triggers.  Plan to continue to treat allergic rhinitis.  She needs pulmonary function testing to assess her degree of obstruction.  Continue her inhaler regimen for now but she needs to  bring her medications in so that we can review, determine what she is actually taking and fine-tune.  Continue Singulair.  Allergic rhinitis Flonase, Xyzal, Singulair  Abnormal CT of the chest Reviewed.  Subtle groundglass versus air-trapping present, etiology unclear.  I suspect this is due to her asthma.  She no longer smokes so RB ILD unlikely.  No known mold exposure.  Could consider checking HSP panel if persists on follow-up in 6 months.  Plan to repeat CT chest in April  Chronic respiratory failure with hypoxia [facility]) She reports that hypoxemia has been identified.  She not on home oxygen.  I will perform a walking oximetry today to troubleshoot.  She does appear to be at risk for sleep apnea.  We may need to discuss overnight oximetry versus formal sleep study at her next visit.  Baltazar Apo, MD, PhD 07/08/2020, 3:53 PM [facility] [phone] or if no answer before 7:00PM call [phone] For any issues after 7:00PM please call eLink [phone]

## 2020-07-08 NOTE — Assessment & Plan Note (Signed)
Flonase, Xyzal, Singulair

## 2020-07-08 NOTE — Patient Instructions (Addendum)
Continue current inhaled medications as you have been taking them.  Bring all of your inhaled medicines with you to your next appointment so that we can review them and decide which ones to continue. Full pulmonary function testing at your next office visit Continue your Flonase nasal spray and Xyzal as you have been using them. We will discuss the timing of repeat CT scan of the chest at your next office visit. Walking oximetry on room air today. Depending on how your breathing, asthma progress, we may need to discuss changing your lisinopril to an alternative blood pressure medicine. Follow with Dr. Delton Coombes next available with full pulmonary function testing on the same day.

## 2020-07-08 NOTE — Assessment & Plan Note (Signed)
Her dyspnea, wheeze.  To be intermittent, trigger related.  She does not know which maintenance inhaler she is on.  According to her PCP notes her [place] was recently changed to [place].  It sounds like she is still using the Wixela, possibly the [place] as well.  She is using albuterol (or some rescue because Atrovent is also on her med list) about once a day.  Her worst time of the year is in the spring due to allergy triggers.  Plan to continue to treat allergic rhinitis.  She needs pulmonary function testing to assess her degree of obstruction.  Continue her inhaler regimen for now but she needs to bring her medications in so that we can review, determine what she is actually taking and fine-tune.  Continue Singulair.

## 2020-07-08 NOTE — Assessment & Plan Note (Signed)
She reports that hypoxemia has been identified.  She not on home oxygen.  I will perform a walking oximetry today to troubleshoot.  She does appear to be at risk for sleep apnea.  We may need to discuss overnight oximetry versus formal sleep study at her next visit.

## 2020-07-08 NOTE — Assessment & Plan Note (Signed)
Reviewed.  Subtle groundglass versus air-trapping present, etiology unclear.  I suspect this is due to her asthma.  She no longer smokes so RB ILD unlikely.  No known mold exposure.  Could consider checking HSP panel if persists on follow-up in 6 months.  Plan to repeat CT chest in April

## 2020-07-08 NOTE — Addendum Note (Signed)
Addended by: Dorisann Frames R on: 07/08/2020 04:19 PM   Modules accepted: Orders

## 2020-08-09 ENCOUNTER — Other Ambulatory Visit: Payer: Self-pay | Admitting: Physician Assistant

## 2020-08-09 DIAGNOSIS — I10 Essential (primary) hypertension: Secondary | ICD-10-CM

## 2020-08-09 DIAGNOSIS — I152 Hypertension secondary to endocrine disorders: Secondary | ICD-10-CM

## 2020-08-09 DIAGNOSIS — E1159 Type 2 diabetes mellitus with other circulatory complications: Secondary | ICD-10-CM

## 2020-08-09 DIAGNOSIS — IMO0002 Reserved for concepts with insufficient information to code with codable children: Secondary | ICD-10-CM

## 2020-08-09 DIAGNOSIS — J4531 Mild persistent asthma with (acute) exacerbation: Secondary | ICD-10-CM

## 2020-08-09 DIAGNOSIS — E11319 Type 2 diabetes mellitus with unspecified diabetic retinopathy without macular edema: Secondary | ICD-10-CM

## 2020-08-13 ENCOUNTER — Ambulatory Visit: Payer: Medicare Other | Admitting: Emergency Medicine

## 2020-08-31 ENCOUNTER — Other Ambulatory Visit: Payer: Self-pay | Admitting: Physician Assistant

## 2020-08-31 DIAGNOSIS — IMO0002 Reserved for concepts with insufficient information to code with codable children: Secondary | ICD-10-CM

## 2020-08-31 DIAGNOSIS — E11319 Type 2 diabetes mellitus with unspecified diabetic retinopathy without macular edema: Secondary | ICD-10-CM

## 2020-10-08 ENCOUNTER — Telehealth: Payer: Self-pay | Admitting: Physician Assistant

## 2020-10-08 DIAGNOSIS — J4531 Mild persistent asthma with (acute) exacerbation: Secondary | ICD-10-CM

## 2020-10-08 MED ORDER — ATROVENT HFA 17 MCG/ACT IN AERS: INHALATION_SPRAY | RESPIRATORY_TRACT | 5 refills | Status: DC

## 2020-10-08 NOTE — Telephone Encounter (Signed)
Sent!

## 2020-10-08 NOTE — Telephone Encounter (Signed)
Pt is requesting a refill on, ATROVENT HFA 17 MCG/ACT inhale, She is almost out, pt stated px would not fill without an appt, I have scheduled her for 10-16-20.

## 2020-10-11 ENCOUNTER — Ambulatory Visit: Payer: Medicare Other | Admitting: Physician Assistant

## 2020-10-16 ENCOUNTER — Ambulatory Visit (INDEPENDENT_AMBULATORY_CARE_PROVIDER_SITE_OTHER): Payer: Medicare Other | Admitting: Physician Assistant

## 2020-10-16 ENCOUNTER — Other Ambulatory Visit: Payer: Self-pay

## 2020-10-16 ENCOUNTER — Other Ambulatory Visit: Payer: Self-pay | Admitting: Physician Assistant

## 2020-10-16 VITALS — BP 181/67 | HR 104 | Ht 63.0 in | Wt 232.0 lb

## 2020-10-16 DIAGNOSIS — J4530 Mild persistent asthma, uncomplicated: Secondary | ICD-10-CM | POA: Diagnosis not present

## 2020-10-16 DIAGNOSIS — E1159 Type 2 diabetes mellitus with other circulatory complications: Secondary | ICD-10-CM

## 2020-10-16 DIAGNOSIS — E1165 Type 2 diabetes mellitus with hyperglycemia: Secondary | ICD-10-CM

## 2020-10-16 DIAGNOSIS — J9611 Chronic respiratory failure with hypoxia: Secondary | ICD-10-CM | POA: Diagnosis not present

## 2020-10-16 DIAGNOSIS — I152 Hypertension secondary to endocrine disorders: Secondary | ICD-10-CM

## 2020-10-16 DIAGNOSIS — IMO0002 Reserved for concepts with insufficient information to code with codable children: Secondary | ICD-10-CM

## 2020-10-16 DIAGNOSIS — Z794 Long term (current) use of insulin: Secondary | ICD-10-CM

## 2020-10-16 DIAGNOSIS — Z6839 Body mass index (BMI) 39.0-39.9, adult: Secondary | ICD-10-CM

## 2020-10-16 DIAGNOSIS — I10 Essential (primary) hypertension: Secondary | ICD-10-CM | POA: Diagnosis not present

## 2020-10-16 DIAGNOSIS — E11319 Type 2 diabetes mellitus with unspecified diabetic retinopathy without macular edema: Secondary | ICD-10-CM | POA: Diagnosis not present

## 2020-10-16 DIAGNOSIS — E785 Hyperlipidemia, unspecified: Secondary | ICD-10-CM

## 2020-10-16 LAB — POCT GLYCOSYLATED HEMOGLOBIN (HGB A1C): Hemoglobin A1C: 10.7 % — AB (ref 4.0–5.6)

## 2020-10-16 MED ORDER — AMLODIPINE BESYLATE 10 MG PO TABS: 10.0000 mg | ORAL_TABLET | Freq: Every day | ORAL | 1 refills | Status: DC

## 2020-10-16 MED ORDER — TRESIBA FLEXTOUCH 200 UNIT/ML ~~LOC~~ SOPN
PEN_INJECTOR | SUBCUTANEOUS | 2 refills | Status: DC
Start: 2020-10-16 — End: 2021-01-27

## 2020-10-16 MED ORDER — LISINOPRIL 40 MG PO TABS: 40.0000 mg | ORAL_TABLET | Freq: Every day | ORAL | 1 refills | Status: DC

## 2020-10-16 MED ORDER — GLIPIZIDE 10 MG PO TABS: 10.0000 mg | ORAL_TABLET | Freq: Two times a day (BID) | ORAL | 0 refills | Status: DC

## 2020-10-16 MED ORDER — CHLORTHALIDONE 25 MG PO TABS: 25.0000 mg | ORAL_TABLET | Freq: Every day | ORAL | 1 refills | Status: DC

## 2020-10-16 MED ORDER — METFORMIN HCL ER 500 MG PO TB24: 1000.0000 mg | ORAL_TABLET | Freq: Two times a day (BID) | ORAL | 1 refills | Status: DC

## 2020-10-16 NOTE — Progress Notes (Signed)
Subjective:    Patient ID: Jamie Strickland, female    DOB: 25-Jul-1945, 75 y.o.   MRN: [id number]  HPI Pt is a 75 yo obese female with HTN, T2DM, Chronic respiratory failure, asthma who presents to the clinic for 3 month follow up.   Pt has uncontrolled diabetes. She is checking her sugars and running 110-230 in the mornings and 200's before meals most of the time. She is taking tresbia 40 units at night but nothing in the morning. She denies any hypoglycemic events or open sores or wounds. She is not doing a lot of the meal time insulin. She denies any CP, palpitations, headaches or vision changes.   Her breathing is stable. She is on Wixella,  singulair, xyzal, atrovent, albuterol.   She does not have a lot of energy.     .. Active Ambulatory Problems    Diagnosis Date Noted   Hyperlipidemia LDL goal <70 12/28/2007   Allergic rhinitis 12/28/2007   OSTEOARTHRITIS 12/28/2007   COLONIC POLYPS, HX OF 12/28/2007   Encounter for medication management 08/31/2011   Uncontrolled type 2 diabetes mellitus with retinopathy, with long-term current use of insulin ([place]) 08/31/2011   Obesity 02/29/2012   Actinic keratosis 11/13/2013   Mild nonproliferative diabetic retinopathy ([place]) 09/25/2014   Medically noncompliant 02/20/2015   Stroke-like symptom 10/04/2016   Hypertension associated with type 2 diabetes mellitus ([place]) 11/13/2016   Hyperlipidemia associated with type 2 diabetes mellitus ([place]) 12/30/2016   Bilateral lower extremity edema 08/02/2017   Dyspnea on exertion 08/02/2017   History of echocardiogram 08/10/2017   Mild mitral regurgitation 08/10/2017   Mild persistent asthma without complication 60/02/9322   Need for diphtheria-tetanus-pertussis (Tdap) vaccine 03/07/2018   Chronic middle ear effusion, left 05/25/2018   Uncontrolled stage 2 hypertension 08/24/2018   At moderate risk for fall 09/16/2018   Mild atherosclerosis of carotid artery, right 11/22/2018   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   Shortness of breath 07/05/2019   Anemia 07/07/2019   Generalized abdominal pain 07/07/2019   Black tarry stools 07/07/2019   Chronic respiratory failure with hypoxia ([place]) 01/31/2020   Abnormal CT of the chest 07/08/2020   Resolved Ambulatory Problems    Diagnosis Date Noted   Essential hypertension, benign 12/28/2007   Mild intermittent allergic asthma without complication 55/73/2202   VAGINITIS 04/11/2010   BACK PAIN 04/11/2010   Cellulitis of buttock, right 01/22/2012   Strain of knee and leg, right 09/06/2012   Acute otalgia, left 12/10/2017   Past Medical History:  Diagnosis Date   Allergic rhinitis    Diabetes mellitus type II    Hx of colonic polyps    Hyperlipidemia    Hypertension    Osteoarthritis        Review of Systems See HPI.     Objective:   Physical Exam Vitals reviewed.  Constitutional:      Appearance: Normal appearance.  HENT:     Head: Normocephalic.  Cardiovascular:     Rate and Rhythm: Normal rate and regular rhythm.     Heart sounds: Murmur heard.  Pulmonary:     Comments: Continues to have labored breathing. Pulse ox 96 percent. No wheezing or rhonchi.  Musculoskeletal:     Cervical back: Neck supple.     Right lower leg: No edema.     Left lower leg: No edema.  Lymphadenopathy:     Cervical: No cervical adenopathy.  Neurological:     General: No focal deficit present.  Mental Status: She is alert and oriented to person, place, and time.  Psychiatric:        Mood and Affect: Mood normal.      .. Lab Results  Component Value Date   HGBA1C 10.7 (A) 10/16/2020       Assessment & Plan:  Marland KitchenMarland KitchenJacinda was seen today for diabetes.  Diagnoses and all orders for this visit:  Uncontrolled type 2 diabetes mellitus with retinopathy, with long-term current use of insulin (HCC) -     POCT glycosylated hemoglobin (Hb A1C) -     insulin degludec (TRESIBA FLEXTOUCH) 200 UNIT/ML FlexTouch Pen; INJECT 40  units at bedtime and 10 units in the morning. SELF-TITRATE BY 2U EVERY 3 DAYS UNTIL FASTING GLUCOSE AT 90-130 OR 40 UNITS. -     glipiZIDE (GLUCOTROL) 10 MG tablet; Take 1 tablet (10 mg total) by mouth 2 (two) times daily before a meal. -     metFORMIN (GLUCOPHAGE-XR) 500 MG 24 hr tablet; Take 2 tablets (1,000 mg total) by mouth 2 (two) times daily with a meal.  Hypertension associated with type 2 diabetes mellitus (HCC) -     amLODipine (NORVASC) 10 MG tablet; Take 1 tablet (10 mg total) by mouth daily. -     chlorthalidone (HYGROTON) 25 MG tablet; Take 1 tablet (25 mg total) by mouth daily. -     lisinopril (ZESTRIL) 40 MG tablet; Take 1 tablet (40 mg total) by mouth daily.  Uncontrolled stage 2 hypertension -     chlorthalidone (HYGROTON) 25 MG tablet; Take 1 tablet (25 mg total) by mouth daily.  A1C not to goal.  Cannot tolerate GLP-1 and SGLT-2. On metformin, glipizide, Tresbia, humalog.  Increased Tresbia to 40 units at night and 10 units in the morning. Goal BS are 90-130 in morning and under 150 at bedtime. Increase by 2 units every 5 days until goal met.  Sliding scale for humalog.  We need better control on insulin and more frequent checking.  BP not to goal. On ACE. Not taken BP meds this morning.  On STATIN.  Foot exam UTD. Callus on middle toe right foot(per patient been there for years) no signs of infection.  Needs eye exam.  Denies covid vaccine/pneumonia/flu/shingles.  Follow up in 3 months.   Discussed endocrinologist. Pt refused. She does not want to see a specialist. She is aware of risk.   She did agree to meet with pharmacist in between 3 month follow up.

## 2020-10-25 ENCOUNTER — Encounter: Payer: Self-pay | Admitting: Physician Assistant

## 2020-11-05 ENCOUNTER — Other Ambulatory Visit: Payer: Self-pay | Admitting: Physician Assistant

## 2020-12-08 ENCOUNTER — Other Ambulatory Visit: Payer: Self-pay | Admitting: Physician Assistant

## 2020-12-08 DIAGNOSIS — E1165 Type 2 diabetes mellitus with hyperglycemia: Secondary | ICD-10-CM

## 2020-12-08 DIAGNOSIS — IMO0002 Reserved for concepts with insufficient information to code with codable children: Secondary | ICD-10-CM

## 2021-01-09 ENCOUNTER — Telehealth: Payer: Self-pay | Admitting: Lab

## 2021-01-09 NOTE — Progress Notes (Signed)
  Chronic Care Management   Outreach Note  01/09/2021 Name: Jamie Strickland MRN: [id number] DOB: 15-Jul-1945  Referred by: Jomarie Longs, PA-C Reason for referral : Medication Management   An unsuccessful telephone outreach was attempted today. The patient was referred to the pharmacist for assistance with care management and care coordination.   Follow Up Plan:   Carilyn Goodpasture  Upstream Scheduler

## 2021-01-14 ENCOUNTER — Telehealth: Payer: Self-pay | Admitting: Pharmacist

## 2021-01-14 NOTE — Chronic Care Management (AMB) (Signed)
Chronic Care Management Pharmacy Assistant   Name: HAYLO FAKE  MRN: [id number] DOB: March 18, 1946  Jamie Strickland is an 75 y.o. year old female who presents for his initial CCM visit with the clinical pharmacist.   Recent office visits:  10/16/20-Jade L. Alden Hipp, PA-C (PCP) Seen for 3 month follow up. Increased Tresbia to 40 units at night and 10 units in the morning. Patient refused to see endocrinologist. Labs ordered. Follow up in 3 months.  Recent consult visits:  None noted  Hospital visits:  None in previous 6 months  Medications: Outpatient Encounter Medications as of 01/14/2021  Medication Sig   ACCU-CHEK GUIDE test strip TEST UP TO 4 TIMES DAILY AS DIRECTED   albuterol (PROAIR HFA) 108 (90 Base) MCG/ACT inhaler INHALE 1-2 PUFFS INTO THE LUNGS EVERY 4 (FOUR) HOURS AS NEEDED FOR WHEEZING OR SHORTNESS OF BREATH   albuterol (PROVENTIL) (2.5 MG/3ML) 0.083% nebulizer solution Please specify directions, refills and quantity   amLODipine (NORVASC) 10 MG tablet Take 1 tablet (10 mg total) by mouth daily.   aspirin EC 81 MG tablet Take 81 mg by mouth daily.   atorvastatin (LIPITOR) 20 MG tablet TAKE 1 TABLET BY MOUTH EVERY DAY   BD INSULIN SYRINGE U/F 31G X 5/16" 0.5 ML MISC USE AS DIRECTED   blood glucose meter kit and supplies Dispense based on patient and insurance preference. Use up to four times daily as directed. (FOR ICD-10 E10.9, E11.9).   carvedilol (COREG) 12.5 MG tablet Take 1 tablet (12.5 mg total) by mouth 2 (two) times daily with a meal.   chlorthalidone (HYGROTON) 25 MG tablet Take 1 tablet (25 mg total) by mouth daily.   clopidogrel (PLAVIX) 75 MG tablet Take 1 tablet (75 mg total) by mouth daily.   fluticasone (FLONASE) 50 MCG/ACT nasal spray SPRAY 2 SPRAYS INTO EACH NOSTRIL EVERY DAY   glipiZIDE (GLUCOTROL) 10 MG tablet Take 1 tablet (10 mg total) by mouth 2 (two) times daily before a meal.   insulin degludec (TRESIBA FLEXTOUCH) 200 UNIT/ML FlexTouch Pen INJECT  40 units at bedtime and 10 units in the morning. SELF-TITRATE BY 2U EVERY 3 DAYS UNTIL FASTING GLUCOSE AT 90-130 OR 40 UNITS.   insulin lispro (HUMALOG) 100 UNIT/ML injection INJECT 25 UNITS INTO THE SKIN 3 TIMES DAILY BEFORE MEALS   Insulin Pen Needle (BD PEN NEEDLE NANO U/F) 32G X 4 MM MISC For use twice daily with insulin pen   ipratropium (ATROVENT HFA) 17 MCG/ACT inhaler 2 puffs every 6 hours or as needed for wheezing.   levocetirizine (XYZAL) 5 MG tablet Take 1 tablet (5 mg total) by mouth every evening.   lisinopril (ZESTRIL) 40 MG tablet Take 1 tablet (40 mg total) by mouth daily.   metFORMIN (GLUCOPHAGE-XR) 500 MG 24 hr tablet Take 2 tablets (1,000 mg total) by mouth 2 (two) times daily with a meal.   montelukast (SINGULAIR) 10 MG tablet Take 1 tablet (10 mg total) by mouth at bedtime.   WIXELA INHUB 250-50 MCG/DOSE AEPB TAKE 1 PUFF BY MOUTH TWICE A DAY   No facility-administered encounter medications on file as of 01/14/2021.   Albuterol (PROAIR HFA) 108 (90 Base) MCG/ACT inhaler Last filled:06/07/19 17 DS Albuterol (PROVENTIL) (2.5 MG/3ML) 0.083% nebulizer solution Last filled:11/14/19 5 DS AmLODipine (NORVASC) 10 MG tablet Last filled:10/16/20 90 DS Aspirin EC 81 MG tablet Last filled:None noted Atorvastatin (LIPITOR) 20 MG tablet Last filled:12/12/20 90 DS Arvedilol (COREG) 12.5 MG tablet Last filled:None noted Chlorthalidone (HYGROTON) 25  MG tablet Last filled:10/16/20 90 DS Clopidogrel (PLAVIX) 75 MG tablet Last filled:07/25/19 90 DS Fluticasone (FLONASE) 50 MCG/ACT nasal spray Last filled:11/07/20 90 DS GlipiZIDE (GLUCOTROL) 10 MG tablet Last filled:11/22/20 90 DS Insulin degludec (TRESIBA FLEXTOUCH) 200 UNIT/ML FlexTouch Pen Last filled:12/27/20 36 DS insulin lispro (HUMALOG) 100 UNIT/ML injection Last filled:01/07/21 26 DS Ipratropium (ATROVENT HFA) 17 MCG/ACT inhaler Last filled:12/03/20 30 DS Levocetirizine (XYZAL) 5 MG tablet Last filled:11/20/20 90 DS Lisinopril  (ZESTRIL) 40 MG tablet Last filled:10/16/20 90 DS MetFORMIN (GLUCOPHAGE-XR) 500 MG 24 hr tablet Last filled:10/16/20 90 DS Montelukast (SINGULAIR) 10 MG tablet Last filled:11/20/20 90 DS WIXELA INHUB 250-50 MCG/DOSE AEPB Last filled:None noted   Star Rating Drugs: Lisinopril (ZESTRIL) 40 MG tablet Last filled:10/16/20 90 DS GlipiZIDE (GLUCOTROL) 10 MG tablet Last filled:11/22/20 90 DS Atorvastatin (LIPITOR) 20 MG tablet Last filled:12/12/20 90 DS MetFORMIN (GLUCOPHAGE-XR) 500 MG 24 hr tablet Last filled:10/16/20 90 DS Insulin degludec (TRESIBA FLEXTOUCH) 200 UNIT/ML FlexTouch Pen Last filled:12/27/20 36 DS  Myriam Elta [place], [place]

## 2021-01-16 ENCOUNTER — Telehealth: Payer: Medicare Other

## 2021-01-16 ENCOUNTER — Telehealth: Payer: Self-pay | Admitting: Physician Assistant

## 2021-01-16 NOTE — Telephone Encounter (Signed)
Patient wanted to cancel her appointment today. She did not mention in voicemail about a reschedule.

## 2021-01-17 ENCOUNTER — Telehealth: Payer: Self-pay | Admitting: *Deleted

## 2021-01-17 NOTE — Chronic Care Management (AMB) (Signed)
  Care Management   Note  01/17/2021 Name: Jamie Strickland MRN: [id number] DOB: 1945/07/28  Jamie Strickland is a 75 y.o. year old female who is a primary care patient of Nolene Ebbs and is actively engaged with the care management team. I reached out to [name] by phone today to assist with re-scheduling an initial visit with the Pharmacist  Follow up plan: Unsuccessful telephone outreach attempt made. A HIPAA compliant phone message was left for the patient providing contact information and requesting a return call.   Burman Nieves, CCMA Care Guide, Embedded Care Coordination [facility]  Care Management  Direct Dial: [phone]

## 2021-01-22 ENCOUNTER — Ambulatory Visit: Payer: Medicare Other | Admitting: Physician Assistant

## 2021-01-22 NOTE — Chronic Care Management (AMB) (Signed)
  Care Management   Note  01/22/2021 Name: [name] MRN: [id number] DOB: 1946/02/05  Jamie Strickland is a 75 y.o. year old female who is a primary care patient of Nolene Ebbs and is actively engaged with the care management team. I reached out to Jamie Strickland by phone today to assist with re-scheduling an initial visit with the Pharmacist  Follow up plan: 2nd attempt Unsuccessful telephone outreach attempt made. A HIPAA compliant phone message was left for the patient providing contact information and requesting a return call.   Burman Nieves, CCMA Care Guide, Embedded Care Coordination [facility]  Care Management  Direct Dial: [phone]

## 2021-01-27 ENCOUNTER — Other Ambulatory Visit: Payer: Self-pay

## 2021-01-27 ENCOUNTER — Ambulatory Visit (INDEPENDENT_AMBULATORY_CARE_PROVIDER_SITE_OTHER): Payer: Medicare Other | Admitting: Physician Assistant

## 2021-01-27 VITALS — BP 172/62 | HR 88 | Ht 63.0 in | Wt 229.0 lb

## 2021-01-27 DIAGNOSIS — I152 Hypertension secondary to endocrine disorders: Secondary | ICD-10-CM | POA: Diagnosis not present

## 2021-01-27 DIAGNOSIS — Z794 Long term (current) use of insulin: Secondary | ICD-10-CM | POA: Diagnosis not present

## 2021-01-27 DIAGNOSIS — E1159 Type 2 diabetes mellitus with other circulatory complications: Secondary | ICD-10-CM

## 2021-01-27 DIAGNOSIS — Z79899 Other long term (current) drug therapy: Secondary | ICD-10-CM | POA: Diagnosis not present

## 2021-01-27 DIAGNOSIS — J4530 Mild persistent asthma, uncomplicated: Secondary | ICD-10-CM

## 2021-01-27 DIAGNOSIS — J9611 Chronic respiratory failure with hypoxia: Secondary | ICD-10-CM

## 2021-01-27 DIAGNOSIS — E785 Hyperlipidemia, unspecified: Secondary | ICD-10-CM | POA: Diagnosis not present

## 2021-01-27 DIAGNOSIS — E11319 Type 2 diabetes mellitus with unspecified diabetic retinopathy without macular edema: Secondary | ICD-10-CM

## 2021-01-27 DIAGNOSIS — IMO0002 Reserved for concepts with insufficient information to code with codable children: Secondary | ICD-10-CM

## 2021-01-27 DIAGNOSIS — Z6839 Body mass index (BMI) 39.0-39.9, adult: Secondary | ICD-10-CM

## 2021-01-27 DIAGNOSIS — R6889 Other general symptoms and signs: Secondary | ICD-10-CM | POA: Diagnosis not present

## 2021-01-27 DIAGNOSIS — E1165 Type 2 diabetes mellitus with hyperglycemia: Secondary | ICD-10-CM | POA: Diagnosis not present

## 2021-01-27 DIAGNOSIS — R4589 Other symptoms and signs involving emotional state: Secondary | ICD-10-CM

## 2021-01-27 LAB — POCT GLYCOSYLATED HEMOGLOBIN (HGB A1C): Hemoglobin A1C: 10.1 % — AB (ref 4.0–5.6)

## 2021-01-27 MED ORDER — TRESIBA FLEXTOUCH 200 UNIT/ML ~~LOC~~ SOPN: PEN_INJECTOR | SUBCUTANEOUS | 2 refills | Status: DC

## 2021-01-27 MED ORDER — HYDRALAZINE HCL 25 MG PO TABS: 25.0000 mg | ORAL_TABLET | Freq: Two times a day (BID) | ORAL | 2 refills | Status: DC

## 2021-01-27 NOTE — Patient Instructions (Addendum)
Start hydralazine 25mg  twice a day.  Endocrinology Dr. [phone]

## 2021-01-27 NOTE — Progress Notes (Signed)
Subjective:    Patient ID: Jamie Strickland, female    DOB: [birth year], 75 y.o.   MRN: [id number]  HPI Pt is a 75 yo obese female with HTN, T2DM, Chronic respiratory failure, Asthma, HLD who presents ot the clinic for 3 month follow up.   HTN- pt is taking lisinopril, norvasc, carvediolol, chlorthalidone. Pt declines any CP, palpitations or headaches. Not checking BP at home.   Asthma- pt is taking wixela, atrovent, albuterol, singulair, xyzal.   DM- pt is taking tresibia 40 units at night and 10 units in morning, metformin, glipizide, humalog 25 units twice a day. Sugars are not controlled. 420 this morning and 425 at bedtime. No open sores or wounds. Denies any hypoglycemic events. Continues to have vision changes.      .. Active Ambulatory Problems    Diagnosis Date Noted   Hyperlipidemia LDL goal <70 12/28/2007   Allergic rhinitis 12/28/2007   OSTEOARTHRITIS 12/28/2007   COLONIC POLYPS, HX OF 12/28/2007   Encounter for medication management 08/31/2011   Uncontrolled type 2 diabetes mellitus with retinopathy, with long-term current use of insulin ([place]) 08/31/2011   Class 2 severe obesity due to excess calories with serious comorbidity and body mass index (BMI) of 39.0 to 39.9 in adult ([place]) 02/29/2012   Actinic keratosis 11/13/2013   Mild nonproliferative diabetic retinopathy ([place]) 09/25/2014   Medically noncompliant 02/20/2015   Stroke-like symptom 10/04/2016   Hypertension associated with type 2 diabetes mellitus ([place]) 11/13/2016   Hyperlipidemia associated with type 2 diabetes mellitus ([place]) 12/30/2016   Bilateral lower extremity edema 08/02/2017   Dyspnea on exertion 08/02/2017   History of echocardiogram 08/10/2017   Mild mitral regurgitation 08/10/2017   Mild persistent asthma without complication 20/94/7096   Need for diphtheria-tetanus-pertussis (Tdap) vaccine 03/07/2018   Chronic middle ear effusion, left 05/25/2018   Uncontrolled stage 2 hypertension 08/24/2018    At moderate risk for fall 09/16/2018   Mild atherosclerosis of carotid artery, right 11/22/2018   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/25/2018   Shortness of breath 07/05/2019   Anemia 07/07/2019   Generalized abdominal pain 07/07/2019   Black tarry stools 07/07/2019   Chronic respiratory failure with hypoxia ([place]) 01/31/2020   Abnormal CT of the chest 07/08/2020   Depressed mood 02/03/2021   Resolved Ambulatory Problems    Diagnosis Date Noted   Essential hypertension, benign 12/28/2007   Mild intermittent allergic asthma without complication 28/36/6294   VAGINITIS 04/11/2010   BACK PAIN 04/11/2010   Cellulitis of buttock, right 01/22/2012   Strain of knee and leg, right 09/06/2012   Acute otalgia, left 12/10/2017   Past Medical History:  Diagnosis Date   Allergic rhinitis    Diabetes mellitus type II    Hx of colonic polyps    Hyperlipidemia    Hypertension    Osteoarthritis     Review of Systems See HPI.     Objective:   Physical Exam Vitals reviewed.  Constitutional:      Appearance: Normal appearance. She is obese.  HENT:     Head: Normocephalic.  Neck:     Vascular: No carotid bruit.  Cardiovascular:     Rate and Rhythm: Normal rate.     Pulses: Normal pulses.     Heart sounds: Murmur heard.  Pulmonary:     Effort: Pulmonary effort is normal.     Breath sounds: Normal breath sounds.  Musculoskeletal:     Right lower leg: No edema.     Left lower  leg: No edema.  Lymphadenopathy:     Cervical: No cervical adenopathy.  Neurological:     General: No focal deficit present.     Mental Status: She is alert and oriented to person, place, and time.  Psychiatric:        Mood and Affect: Mood normal.     .. Results for orders placed or performed in visit on 01/27/21  COMPLETE METABOLIC PANEL WITH GFR  Result Value Ref Range   Glucose, Bld 214 (H) 65 - 99 mg/dL   BUN 24 7 - 25 mg/dL   Creat 0.82 0.60 - 1.00 mg/dL   eGFR 75 > OR = 60 mL/min/1.57m    BUN/Creatinine Ratio NOT APPLICABLE 6 - 22 (calc)   Sodium 138 135 - 146 mmol/L   Potassium 4.4 3.5 - 5.3 mmol/L   Chloride 98 98 - 110 mmol/L   CO2 28 20 - 32 mmol/L   Calcium 9.5 8.6 - 10.4 mg/dL   Total Protein 7.2 6.1 - 8.1 g/dL   Albumin 4.1 3.6 - 5.1 g/dL   Globulin 3.1 1.9 - 3.7 g/dL (calc)   AG Ratio 1.3 1.0 - 2.5 (calc)   Total Bilirubin 0.4 0.2 - 1.2 mg/dL   Alkaline phosphatase (APISO) 109 37 - 153 U/L   AST 32 10 - 35 U/L   ALT 49 (H) 6 - 29 U/L  POCT glycosylated hemoglobin (Hb A1C)  Result Value Ref Range   Hemoglobin A1C 10.1 (A) 4.0 - 5.6 %   HbA1c POC (<> result, manual entry)     HbA1c, POC (prediabetic range)     HbA1c, POC (controlled diabetic range)          Assessment & Plan:  .Marland KitchenMarland KitchenRayneshawas seen today for follow-up.  Diagnoses and all orders for this visit:  Uncontrolled type 2 diabetes mellitus with retinopathy, with long-term current use of insulin (HCC) -     POCT glycosylated hemoglobin (Hb A1C) -     insulin degludec (TRESIBA FLEXTOUCH) 200 UNIT/ML FlexTouch Pen; INJECT 40 units at bedtime and 40 units in the morning. SELF-TITRATE BY 2U EVERY 3 DAYS UNTIL FASTING GLUCOSE AT 90-130 OR 40 UNITS. -     COMPLETE METABOLIC PANEL WITH GFR -     Ambulatory referral to Endocrinology  Medication management -     COMPLETE METABOLIC PANEL WITH GFR  Hypertension associated with type 2 diabetes mellitus (HCC) -     hydrALAZINE (APRESOLINE) 25 MG tablet; Take 1 tablet (25 mg total) by mouth 2 (two) times daily.  Hyperlipidemia LDL goal <70 -     Lipid Panel w/reflex Direct LDL  Class 2 severe obesity due to excess calories with serious comorbidity and body mass index (BMI) of 39.0 to 39.9 in adult ([facility]  Chronic respiratory failure with hypoxia (HCC)  Mild persistent asthma without complication  Depressed mood  A!C remains not to goal.  Discussed diabetic diet and what foods will make sugars worse.  Increased tresiba's morning dose to by 2 units every  5 days in morning and evening until fasting sugar is 90-130 in morning and below 180 in the evening.  Continue humalog at every meal and check during the day and adjust via sliding scale.  Continue metformin/glipizide. BP not to goal. Added Hydralazine.  Recheck BP in 2 weeks.  On statin. Last LDL 66 to goal. Needs recheck. Needs eye exam. Known retinopathy.  UTD foot exam.  Declines covid, shingles, flu, pneumonia vaccine.   Pt NEEDS endocrinology.  She has declined in past but agreed today. Referral made.   Discussed mood. Pt declined any intervention. Denies SI/HC.  Asthma-refilled Wixella. Continue albuterol.

## 2021-01-28 LAB — COMPLETE METABOLIC PANEL WITH GFR
AG Ratio: 1.3 (calc) (ref 1.0–2.5)
ALT: 49 U/L — ABNORMAL HIGH (ref 6–29)
AST: 32 U/L (ref 10–35)
Albumin: 4.1 g/dL (ref 3.6–5.1)
Alkaline phosphatase (APISO): 109 U/L (ref 37–153)
BUN: 24 mg/dL (ref 7–25)
CO2: 28 mmol/L (ref 20–32)
Calcium: 9.5 mg/dL (ref 8.6–10.4)
Chloride: 98 mmol/L (ref 98–110)
Creat: 0.82 mg/dL (ref 0.60–1.00)
Globulin: 3.1 g/dL (calc) (ref 1.9–3.7)
Glucose, Bld: 214 mg/dL — ABNORMAL HIGH (ref 65–99)
Potassium: 4.4 mmol/L (ref 3.5–5.3)
Sodium: 138 mmol/L (ref 135–146)
Total Bilirubin: 0.4 mg/dL (ref 0.2–1.2)
Total Protein: 7.2 g/dL (ref 6.1–8.1)
eGFR: 75 mL/min/{1.73_m2} (ref >=60)

## 2021-01-29 ENCOUNTER — Other Ambulatory Visit: Payer: Self-pay | Admitting: Physician Assistant

## 2021-01-29 DIAGNOSIS — E11319 Type 2 diabetes mellitus with unspecified diabetic retinopathy without macular edema: Secondary | ICD-10-CM

## 2021-01-29 DIAGNOSIS — J4531 Mild persistent asthma with (acute) exacerbation: Secondary | ICD-10-CM

## 2021-01-29 DIAGNOSIS — IMO0002 Reserved for concepts with insufficient information to code with codable children: Secondary | ICD-10-CM

## 2021-01-29 DIAGNOSIS — I152 Hypertension secondary to endocrine disorders: Secondary | ICD-10-CM

## 2021-01-29 DIAGNOSIS — E1159 Type 2 diabetes mellitus with other circulatory complications: Secondary | ICD-10-CM

## 2021-01-29 DIAGNOSIS — J3089 Other allergic rhinitis: Secondary | ICD-10-CM

## 2021-01-29 DIAGNOSIS — E785 Hyperlipidemia, unspecified: Secondary | ICD-10-CM

## 2021-01-31 NOTE — Chronic Care Management (AMB) (Signed)
  Care Management   Note  01/31/2021 Name: Jamie Strickland MRN: [id number] DOB: [birth year]  Jamie Strickland is a 75 y.o. year old female who is a primary care patient of Jamie Strickland and is actively engaged with the care management team. I reached out to Jamie Strickland by phone today to assist with re-scheduling an initial visit with the Pharmacist  Follow up plan: Patient declines further follow up and engagement by the care management team. Appropriate care team members and provider have been notified via electronic communication.   Burman Nieves, CCMA Care Guide, Embedded Care Coordination [facility]  Care Management  Direct Dial: [phone]

## 2021-02-03 ENCOUNTER — Other Ambulatory Visit: Payer: Self-pay | Admitting: Neurology

## 2021-02-03 ENCOUNTER — Encounter: Payer: Self-pay | Admitting: Physician Assistant

## 2021-02-03 DIAGNOSIS — R748 Abnormal levels of other serum enzymes: Secondary | ICD-10-CM

## 2021-02-03 DIAGNOSIS — R4589 Other symptoms and signs involving emotional state: Secondary | ICD-10-CM | POA: Insufficient documentation

## 2021-02-09 ENCOUNTER — Other Ambulatory Visit: Payer: Self-pay | Admitting: Physician Assistant

## 2021-02-09 DIAGNOSIS — E1159 Type 2 diabetes mellitus with other circulatory complications: Secondary | ICD-10-CM

## 2021-02-28 ENCOUNTER — Other Ambulatory Visit: Payer: Self-pay | Admitting: Physician Assistant

## 2021-02-28 DIAGNOSIS — E11319 Type 2 diabetes mellitus with unspecified diabetic retinopathy without macular edema: Secondary | ICD-10-CM

## 2021-03-01 ENCOUNTER — Other Ambulatory Visit: Payer: Self-pay | Admitting: Physician Assistant

## 2021-03-01 DIAGNOSIS — I152 Hypertension secondary to endocrine disorders: Secondary | ICD-10-CM

## 2021-03-01 DIAGNOSIS — E1159 Type 2 diabetes mellitus with other circulatory complications: Secondary | ICD-10-CM

## 2021-03-28 ENCOUNTER — Other Ambulatory Visit: Payer: Self-pay | Admitting: Physician Assistant

## 2021-03-28 DIAGNOSIS — I152 Hypertension secondary to endocrine disorders: Secondary | ICD-10-CM

## 2021-03-28 DIAGNOSIS — E11319 Type 2 diabetes mellitus with unspecified diabetic retinopathy without macular edema: Secondary | ICD-10-CM

## 2021-03-28 DIAGNOSIS — E1159 Type 2 diabetes mellitus with other circulatory complications: Secondary | ICD-10-CM

## 2021-03-30 ENCOUNTER — Other Ambulatory Visit: Payer: Self-pay | Admitting: Physician Assistant

## 2021-03-30 DIAGNOSIS — E11319 Type 2 diabetes mellitus with unspecified diabetic retinopathy without macular edema: Secondary | ICD-10-CM

## 2021-03-31 ENCOUNTER — Other Ambulatory Visit: Payer: Self-pay | Admitting: Physician Assistant

## 2021-03-31 DIAGNOSIS — E11319 Type 2 diabetes mellitus with unspecified diabetic retinopathy without macular edema: Secondary | ICD-10-CM

## 2021-04-28 ENCOUNTER — Other Ambulatory Visit: Payer: Self-pay

## 2021-04-28 ENCOUNTER — Encounter: Payer: Self-pay | Admitting: Physician Assistant

## 2021-04-28 ENCOUNTER — Ambulatory Visit (INDEPENDENT_AMBULATORY_CARE_PROVIDER_SITE_OTHER): Payer: Medicare Other | Admitting: Physician Assistant

## 2021-04-28 VITALS — BP 193/57 | HR 92 | Ht 63.0 in | Wt 228.0 lb

## 2021-04-28 DIAGNOSIS — Z6839 Body mass index (BMI) 39.0-39.9, adult: Secondary | ICD-10-CM

## 2021-04-28 DIAGNOSIS — J9611 Chronic respiratory failure with hypoxia: Secondary | ICD-10-CM

## 2021-04-28 DIAGNOSIS — E1159 Type 2 diabetes mellitus with other circulatory complications: Secondary | ICD-10-CM | POA: Diagnosis not present

## 2021-04-28 DIAGNOSIS — J4541 Moderate persistent asthma with (acute) exacerbation: Secondary | ICD-10-CM

## 2021-04-28 DIAGNOSIS — I152 Hypertension secondary to endocrine disorders: Secondary | ICD-10-CM

## 2021-04-28 DIAGNOSIS — E785 Hyperlipidemia, unspecified: Secondary | ICD-10-CM

## 2021-04-28 DIAGNOSIS — I10 Essential (primary) hypertension: Secondary | ICD-10-CM

## 2021-04-28 LAB — POCT GLYCOSYLATED HEMOGLOBIN (HGB A1C): Hemoglobin A1C: 11 % — AB (ref 4.0–5.6)

## 2021-04-28 MED ORDER — PREDNISONE 50 MG PO TABS: ORAL_TABLET | ORAL | 0 refills | Status: DC

## 2021-04-28 NOTE — Progress Notes (Signed)
Subjective:    Patient ID: Jamie Strickland, female    DOB: 26-Aug-1945, 75 y.o.   MRN: [id number]  HPI Pt is a 75 yo obese female with uncontrolled T2DM and HTN, HLD, COPD who presents to the clinic for follow up.   Pt having worsening SOB for the last week or so. No fever, chills, body aches. Using albuterol regularly and multiple times a day. Always has to sleep on pillows to breath at night. No edema. No CP. She is on norvasc, lipitor, Wixella, singulair.   Pt is checking her sugars in the morning/evening/meals and usually always elevated in the 130-180s. She reports for the most part she is taking her diabetic medication. Not on any diet and not exercising. No open sores or wounds.   .. Active Ambulatory Problems    Diagnosis Date Noted   Hyperlipidemia LDL goal <70 12/28/2007   Allergic rhinitis 12/28/2007   OSTEOARTHRITIS 12/28/2007   COLONIC POLYPS, HX OF 12/28/2007   Encounter for medication management 08/31/2011   Uncontrolled type 2 diabetes mellitus with retinopathy, with long-term current use of insulin 08/31/2011   Class 2 severe obesity due to excess calories with serious comorbidity and body mass index (BMI) of 39.0 to 39.9 in adult (HCC) 02/29/2012   Actinic keratosis 11/13/2013   Mild nonproliferative diabetic retinopathy (HCC) 09/25/2014   Medically noncompliant 02/20/2015   Stroke-like symptom 10/04/2016   Hypertension associated with type 2 diabetes mellitus (HCC) 11/13/2016   Hyperlipidemia associated with type 2 diabetes mellitus (HCC) 12/30/2016   Bilateral lower extremity edema 08/02/2017   Dyspnea on exertion 08/02/2017   History of echocardiogram 08/10/2017   Mild mitral regurgitation 08/10/2017   Mild persistent asthma without complication 12/10/2017   Need for diphtheria-tetanus-pertussis (Tdap) vaccine 03/07/2018   Chronic middle ear effusion, left 05/25/2018   Uncontrolled stage 2 hypertension 08/24/2018   At moderate risk for fall 09/16/2018   Mild  atherosclerosis of carotid artery, right 11/22/2018   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/25/2018   Shortness of breath 07/05/2019   Anemia 07/07/2019   Generalized abdominal pain 07/07/2019   Black tarry stools 07/07/2019   Chronic respiratory failure with hypoxia (HCC) 01/31/2020   Abnormal CT of the chest 07/08/2020   Depressed mood 02/03/2021   Asthmatic bronchitis 06/06/2021   Resolved Ambulatory Problems    Diagnosis Date Noted   Essential hypertension, benign 12/28/2007   Mild intermittent allergic asthma without complication 12/28/2007   VAGINITIS 04/11/2010   BACK PAIN 04/11/2010   Cellulitis of buttock, right 01/22/2012   Strain of knee and leg, right 09/06/2012   Acute otalgia, left 12/10/2017   Past Medical History:  Diagnosis Date   Allergic rhinitis    Diabetes mellitus type II    Hx of colonic polyps    Hyperlipidemia    Hypertension    Osteoarthritis           Review of Systems See HPI.     Objective:   Physical Exam Vitals reviewed.  Constitutional:      Appearance: Normal appearance. She is obese.  HENT:     Head: Normocephalic.  Cardiovascular:     Rate and Rhythm: Normal rate and regular rhythm.     Pulses: Normal pulses.  Pulmonary:     Breath sounds: Wheezing present.     Comments: Labored breathing Coarse breath sounds  Musculoskeletal:     Right lower leg: No edema.     Left lower leg: No edema.  Skin:  Coloration: Skin is pale.  Neurological:     General: No focal deficit present.     Mental Status: She is alert and oriented to person, place, and time.  Psychiatric:        Mood and Affect: Mood normal.      .. Results for orders placed or performed in visit on 04/28/21  POCT glycosylated hemoglobin (Hb A1C)  Result Value Ref Range   Hemoglobin A1C 11.0 (A) 4.0 - 5.6 %   HbA1c POC (<> result, manual entry)     HbA1c, POC (prediabetic range)     HbA1c, POC (controlled diabetic range)         Assessment &  Plan:  Marland KitchenMarland KitchenMatina was seen today for follow-up.  Diagnoses and all orders for this visit:  Severe uncontrolled hypertension -     AMB Referral to [facility] Coordinaton  Hypertension associated with type 2 diabetes mellitus (HCC) -     POCT glycosylated hemoglobin (Hb A1C) -     AMB Referral to [facility] Coordin[facility]  Chronic respiratory failure with hypoxia (HCC) -     AMB Referral to Community Care Coordinaton  Moderate persistent asthmatic bronchitis with acute exacerbation -     predniSONE (DELTASONE) 50 MG tablet; Take one tablet for 5 days.  Hyperlipidemia LDL goal <70 -     AMB Referral to [facility] Coordinaton  Class 2 severe obesity due to excess calories with serious comorbidity and body mass index (BMI) of 39.0 to 39.9 in adult (HCC)   Pulse ox 93 percent.  In exacerbation prednisone given for 5 days.  Continue with Wixella. Consider Trelegy. Pt does not want to change at this time.  Use albuterol as needed.  When not in exacerbation consider 6 minute walk test to see if would qualify for O2.   A1C not improving.  She does not want to go to endocrinology Will make referral to [facility]. Increase tresbia to 25 units in morning and then increase 2 units every 3 days unit glucose in evenings is below 180.  Increase evening dose by 2 units every 3 days until glucose is below 130 in the morning. Continue humalog 25 units at each meal for now.  Glipizide continue.  On lipitor.  BP not controlled. On chlorthalidone/lisionpril/norvasc/coreg/hydralazine Please make sure taking and checking BP and keep log of BP.  Needs eye exam.  Declines covid vaccine.  Pneumonia and flu shot UTD.   I do think patient could not be taking all the medications. She is resistant to take more medications.  Will refer to [facility].   Follow up in 1 month.

## 2021-04-28 NOTE — Patient Instructions (Addendum)
I will get you set up with Jamie Strickland to discuss medications and continuous glucose monitor.  [name] increase morning dose to 25 units and then 2 units every 3 days until glucose in the evening is below 180.  Increase evening dose by 2 units every 3 days until glucose is below 130 in the morning.   Humalog continue 25 units at each meal for now.

## 2021-05-02 ENCOUNTER — Other Ambulatory Visit: Payer: Self-pay | Admitting: Physician Assistant

## 2021-05-02 DIAGNOSIS — I10 Essential (primary) hypertension: Secondary | ICD-10-CM

## 2021-05-02 DIAGNOSIS — E1159 Type 2 diabetes mellitus with other circulatory complications: Secondary | ICD-10-CM

## 2021-05-02 DIAGNOSIS — E11319 Type 2 diabetes mellitus with unspecified diabetic retinopathy without macular edema: Secondary | ICD-10-CM

## 2021-05-17 ENCOUNTER — Other Ambulatory Visit: Payer: Self-pay | Admitting: Physician Assistant

## 2021-06-06 ENCOUNTER — Encounter: Payer: Self-pay | Admitting: Physician Assistant

## 2021-06-06 DIAGNOSIS — J45909 Unspecified asthma, uncomplicated: Secondary | ICD-10-CM | POA: Insufficient documentation

## 2021-06-09 ENCOUNTER — Telehealth: Payer: Self-pay | Admitting: *Deleted

## 2021-06-09 NOTE — Chronic Care Management (AMB) (Signed)
Chronic Care Management   Note  06/09/2021 Name: Jamie Strickland MRN: [id number] DOB: Jan 05, 1946  Jamie Strickland is a 75 y.o. year old female who is a primary care patient of Donella Stade, [place]. I reached out to Jamie Strickland by phone today in response to a referral sent by Ms. Juline Patch Clearman's PCP.  Ms. Allor was given information about Chronic Care Management services today including:  CCM service includes personalized support from designated clinical staff supervised by her physician, including individualized plan of care and coordination with other care providers 24/7 contact phone numbers for assistance for urgent and routine care needs. Service will only be billed when office clinical staff spend 20 minutes or more in a month to coordinate care. Only one practitioner may furnish and bill the service in a calendar month. The patient may stop CCM services at any time (effective at the end of the month) by phone call to the office staff. The patient is responsible for co-pay (up to 20% after annual deductible is met) if co-pay is required by the individual health plan.   Patient did not agree to enrollment in care management services and does not wish to consider at this time.  Follow up plan: Patient declines further follow up and engagement by the care management team. Appropriate care team members and provider have been notified via electronic communication.   Julian Hy, [place] Management  Direct Dial: [phone]

## 2021-06-10 ENCOUNTER — Other Ambulatory Visit: Payer: Self-pay | Admitting: Physician Assistant

## 2021-06-10 DIAGNOSIS — J4531 Mild persistent asthma with (acute) exacerbation: Secondary | ICD-10-CM

## 2021-06-30 ENCOUNTER — Telehealth: Payer: Self-pay

## 2021-06-30 NOTE — Telephone Encounter (Signed)
Called to try and schedule new patient appt.

## 2021-07-01 NOTE — Telephone Encounter (Addendum)
2nd attempt to contact pt for appt scheduling. VM not set up

## 2021-07-04 ENCOUNTER — Telehealth: Payer: Self-pay

## 2021-07-04 NOTE — Telephone Encounter (Signed)
Called and left a message with pt's son to have pt call back to schedule NP appt.

## 2021-07-06 ENCOUNTER — Other Ambulatory Visit: Payer: Self-pay | Admitting: Physician Assistant

## 2021-07-06 DIAGNOSIS — E11319 Type 2 diabetes mellitus with unspecified diabetic retinopathy without macular edema: Secondary | ICD-10-CM

## 2021-07-28 ENCOUNTER — Encounter: Payer: Self-pay | Admitting: Physician Assistant

## 2021-07-28 ENCOUNTER — Other Ambulatory Visit: Payer: Self-pay

## 2021-07-28 ENCOUNTER — Ambulatory Visit (INDEPENDENT_AMBULATORY_CARE_PROVIDER_SITE_OTHER): Payer: Medicare Other | Admitting: Physician Assistant

## 2021-07-28 VITALS — BP 189/65 | HR 74 | Ht 63.0 in | Wt 228.0 lb

## 2021-07-28 DIAGNOSIS — E11319 Type 2 diabetes mellitus with unspecified diabetic retinopathy without macular edema: Secondary | ICD-10-CM | POA: Diagnosis not present

## 2021-07-28 DIAGNOSIS — E1165 Type 2 diabetes mellitus with hyperglycemia: Secondary | ICD-10-CM | POA: Diagnosis not present

## 2021-07-28 DIAGNOSIS — I340 Nonrheumatic mitral (valve) insufficiency: Secondary | ICD-10-CM | POA: Diagnosis not present

## 2021-07-28 DIAGNOSIS — I10 Essential (primary) hypertension: Secondary | ICD-10-CM

## 2021-07-28 DIAGNOSIS — J9611 Chronic respiratory failure with hypoxia: Secondary | ICD-10-CM

## 2021-07-28 DIAGNOSIS — J4531 Mild persistent asthma with (acute) exacerbation: Secondary | ICD-10-CM | POA: Diagnosis not present

## 2021-07-28 MED ORDER — HYDRALAZINE HCL 50 MG PO TABS: 50.0000 mg | ORAL_TABLET | Freq: Three times a day (TID) | ORAL | 2 refills | Status: DC

## 2021-07-28 NOTE — Patient Instructions (Signed)
Increase hydralazine to 50mg  three times a day for BP.  ?Continue on other medications.  ?Increase mealtime insulin to 25 to 50 units.  ?

## 2021-07-28 NOTE — Progress Notes (Signed)
? ?Subjective:  ? ? Patient ID: Jamie Strickland, female    DOB: 1945/11/21, 76 y.o.   MRN: [id number] ? ?HPI ?Pt is a 76 yo female with uncontrolled diabetes, uncontrolled hypertension, chronic respiratory failure without O2 who presents to the clinic for 3 month follow up.  ? ?Pt is taking tresbia 40 am and pm. She has run out of humalog at meal time. Not sure when she stopped. She does check her sugars but ranges all over the place. No hypoglycemic events per patient. No CP, palpitations, headaches. She has ongoing SOB. Does not use O2. Using wixella inhalers. She is not checking BP at home.  ? ?.. ?Active Ambulatory Problems  ?  Diagnosis Date Noted  ? Hyperlipidemia LDL goal <70 12/28/2007  ? Allergic rhinitis 12/28/2007  ? OSTEOARTHRITIS 12/28/2007  ? COLONIC POLYPS, HX OF 12/28/2007  ? Encounter for medication management 08/31/2011  ? Uncontrolled type 2 diabetes mellitus with hyperglycemia ([place]) 08/31/2011  ? Class 2 severe obesity due to excess calories with serious comorbidity and body mass index (BMI) of 39.0 to 39.9 in adult [facility]) 02/29/2012  ? Actinic keratosis 11/13/2013  ? Mild nonproliferative diabetic retinopathy ([place]) 09/25/2014  ? Medically noncompliant 02/20/2015  ? Stroke-like symptom 10/04/2016  ? Hypertension associated with type 2 diabetes mellitus ([place]) 11/13/2016  ? Hyperlipidemia associated with type 2 diabetes mellitus ([place]) 12/30/2016  ? Bilateral lower extremity edema 08/02/2017  ? Dyspnea on exertion 08/02/2017  ? History of echocardiogram 08/10/2017  ? Mild mitral regurgitation 08/10/2017  ? Mild persistent asthma without complication 70/17/7939  ? Need for diphtheria-tetanus-pertussis (Tdap) vaccine 03/07/2018  ? Chronic middle ear effusion, left 05/25/2018  ? Uncontrolled stage 2 hypertension 08/24/2018  ? At moderate risk for fall 09/16/2018  ? Mild atherosclerosis of carotid artery, right 11/22/2018  ? 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/25/2018  ? Shortness of breath 07/05/2019   ? Anemia 07/07/2019  ? Generalized abdominal pain 07/07/2019  ? Black tarry stools 07/07/2019  ? Chronic respiratory failure with hypoxia ([place]) 01/31/2020  ? Abnormal CT of the chest 07/08/2020  ? Depressed mood 02/03/2021  ? Asthmatic bronchitis 06/06/2021  ? ?Resolved Ambulatory Problems  ?  Diagnosis Date Noted  ? Essential hypertension, benign 12/28/2007  ? Mild intermittent allergic asthma without complication 03/00/9233  ? VAGINITIS 04/11/2010  ? BACK PAIN 04/11/2010  ? Cellulitis of buttock, right 01/22/2012  ? Strain of knee and leg, right 09/06/2012  ? Acute otalgia, left 12/10/2017  ? ?Past Medical History:  ?Diagnosis Date  ? Allergic rhinitis   ? Diabetes mellitus type II   ? Hx of colonic polyps   ? Hyperlipidemia   ? Hypertension   ? Osteoarthritis   ? ? ? ?Review of Systems ?See HPI.  ?   ?Objective:  ? Physical Exam ?Vitals reviewed.  ?Constitutional:   ?   Appearance: She is obese.  ?HENT:  ?   Head: Normocephalic.  ?Cardiovascular:  ?   Rate and Rhythm: Normal rate and regular rhythm.  ?   Pulses: Normal pulses.  ?   Heart sounds: Murmur heard.  ?Pulmonary:  ?   Comments: Slightly labored without cough ?Diminished lung sounds bilaterally ?Abdominal:  ?   General: Bowel sounds are normal.  ?   Palpations: Abdomen is soft.  ?Musculoskeletal:  ?   Right lower leg: No edema.  ?   Left lower leg: No edema.  ?Neurological:  ?   General: No focal deficit present.  ?  Mental Status: She is alert and oriented to person, place, and time.  ?Psychiatric:     ?   Mood and Affect: Mood normal.  ? ? ? ?.. ?Results for orders placed or performed in visit on 07/28/21  ?COMPLETE METABOLIC PANEL WITH GFR  ?Result Value Ref Range  ? Glucose, Bld 500 (HH) 65 - 99 mg/dL  ? BUN 28 (H) 7 - 25 mg/dL  ? Creat 0.94 0.60 - 1.00 mg/dL  ? eGFR 64 > OR = 60 mL/min/1.55m  ? BUN/Creatinine Ratio 30 (H) 6 - 22 (calc)  ? Sodium 136 135 - 146 mmol/L  ? Potassium 4.4 3.5 - 5.3 mmol/L  ? Chloride 100 98 - 110 mmol/L  ? CO2 21 20 -  32 mmol/L  ? Calcium 9.9 8.6 - 10.4 mg/dL  ? Total Protein 7.2 6.1 - 8.1 g/dL  ? Albumin 4.2 3.6 - 5.1 g/dL  ? Globulin 3.0 1.9 - 3.7 g/dL (calc)  ? AG Ratio 1.4 1.0 - 2.5 (calc)  ? Total Bilirubin 0.3 0.2 - 1.2 mg/dL  ? Alkaline phosphatase (APISO) 106 37 - 153 U/L  ? AST 22 10 - 35 U/L  ? ALT 33 (H) 6 - 29 U/L  ? ? ? ?   ?Assessment & Plan:  ?..HGeneverwas seen today for follow-up. ? ?Diagnoses and all orders for this visit: ? ?Uncontrolled type 2 diabetes mellitus with hyperglycemia (HCC) ?-     POCT glycosylated hemoglobin (Hb A1C) ?-     COMPLETE METABOLIC PANEL WITH GFR ?-     insulin lispro (HUMALOG KWIKPEN) 200 UNIT/ML KwikPen; Inject 25-50 Units into the skin 3 (three) times daily before meals. ? ?Uncontrolled hypertension ?-     hydrALAZINE (APRESOLINE) 50 MG tablet; Take 1 tablet (50 mg total) by mouth 3 (three) times daily. ? ?Type 2 diabetes mellitus with unspecified diabetic retinopathy without macular edema (HCC) ?-     Discontinue: insulin degludec (TRESIBA FLEXTOUCH) 200 UNIT/ML FlexTouch Pen; INJECT 40 UNITS IN MORNING & AT BEDTIME-TITRATE BY 2UNITS EVERY 3 DAYS UNTIL FASTING GLUCOSE 90-130 ?-     insulin degludec (TRESIBA FLEXTOUCH) 200 UNIT/ML FlexTouch Pen; INJECT 40 UNITS IN MORNING & AT BEDTIME-TITRATE BY 2UNITS EVERY 3 DAYS UNTIL FASTING GLUCOSE 90-130 ? ?Mild persistent asthma with acute exacerbation ? ?Mild mitral regurgitation ? ?Chronic respiratory failure with hypoxia (HCC) ? ? ?A1C is not to goal. ?Increase meal time insulin to 25 to 50 units and or use sliding scale.  ?Make sure taking long acting insulin and ok to increase to get fasting to goal of 120 in am.  ?BP not to goal. Added hydralazine 537mTID.  ?On statin.  ?Declined foot exam and endocrinology referral.  ?Encouraged eye exam.  ?Declined vaccines.  ? ?Follow up in 2 weeks for BP and check glucose log.  ? ?All other medications stay the same. ? ? ? ?

## 2021-07-29 LAB — COMPLETE METABOLIC PANEL WITH GFR
AG Ratio: 1.4 (calc) (ref 1.0–2.5)
ALT: 33 U/L — ABNORMAL HIGH (ref 6–29)
AST: 22 U/L (ref 10–35)
Albumin: 4.2 g/dL (ref 3.6–5.1)
Alkaline phosphatase (APISO): 106 U/L (ref 37–153)
BUN/Creatinine Ratio: 30 (calc) — ABNORMAL HIGH (ref 6–22)
BUN: 28 mg/dL — ABNORMAL HIGH (ref 7–25)
CO2: 21 mmol/L (ref 20–32)
Calcium: 9.9 mg/dL (ref 8.6–10.4)
Chloride: 100 mmol/L (ref 98–110)
Creat: 0.94 mg/dL (ref 0.60–1.00)
Globulin: 3 g/dL (calc) (ref 1.9–3.7)
Glucose, Bld: 500 mg/dL (ref 65–99)
Potassium: 4.4 mmol/L (ref 3.5–5.3)
Sodium: 136 mmol/L (ref 135–146)
Total Bilirubin: 0.3 mg/dL (ref 0.2–1.2)
Total Protein: 7.2 g/dL (ref 6.1–8.1)
eGFR: 64 mL/min/{1.73_m2} (ref >=60)

## 2021-07-29 MED ORDER — TRESIBA FLEXTOUCH 200 UNIT/ML ~~LOC~~ SOPN: PEN_INJECTOR | SUBCUTANEOUS | 2 refills | Status: DC

## 2021-07-29 NOTE — Progress Notes (Signed)
Sent another refill of tresbia.

## 2021-07-29 NOTE — Progress Notes (Signed)
Jamie Strickland,  ? ?Sugars this high can cause something called diabetic Ketoacidosis(DKA)  ? ?Please watch out for the more serious symptoms of DKA.  ?DKA Signs and Symptoms ?DKA usually develops slowly. Early symptoms include: ? ?Being very thirsty. ?Urinating a lot more than usual. ?If untreated, more severe symptoms can appear quickly, such as: ? ?Fast, deep breathing. ?Dry skin and mouth. ?Flushed face. ?Fruity-smelling breath. ?Headache. ?Muscle stiffness or aches. ?Being very tired. ?Nausea and vomiting. ?Stomach pain. ? ?We need to get your sugars down.  ?Increase toujeo to 50 units in evening and morning.  ?Start checking your sugars 4-6 times a day and use sliding scale with humalog insulin.  ? ?Follow up in 1 week to recheck.

## 2021-07-30 MED ORDER — TRESIBA FLEXTOUCH 200 UNIT/ML ~~LOC~~ SOPN: PEN_INJECTOR | SUBCUTANEOUS | 2 refills | Status: DC

## 2021-07-30 MED ORDER — HUMALOG KWIKPEN 200 UNIT/ML ~~LOC~~ SOPN: 25.0000 [IU] | PEN_INJECTOR | Freq: Three times a day (TID) | SUBCUTANEOUS | 2 refills | Status: DC

## 2021-07-30 NOTE — Progress Notes (Signed)
Sent!

## 2021-07-31 ENCOUNTER — Other Ambulatory Visit: Payer: Self-pay | Admitting: Physician Assistant

## 2021-08-28 ENCOUNTER — Other Ambulatory Visit: Payer: Self-pay | Admitting: Physician Assistant

## 2021-08-28 DIAGNOSIS — E11319 Type 2 diabetes mellitus with unspecified diabetic retinopathy without macular edema: Secondary | ICD-10-CM

## 2021-08-28 DIAGNOSIS — I10 Essential (primary) hypertension: Secondary | ICD-10-CM

## 2021-08-30 ENCOUNTER — Other Ambulatory Visit: Payer: Self-pay | Admitting: Physician Assistant

## 2021-08-30 DIAGNOSIS — J4531 Mild persistent asthma with (acute) exacerbation: Secondary | ICD-10-CM

## 2021-09-01 ENCOUNTER — Other Ambulatory Visit: Payer: Self-pay | Admitting: Physician Assistant

## 2021-09-01 DIAGNOSIS — J4531 Mild persistent asthma with (acute) exacerbation: Secondary | ICD-10-CM

## 2021-10-04 ENCOUNTER — Other Ambulatory Visit: Payer: Self-pay | Admitting: Physician Assistant

## 2021-10-04 DIAGNOSIS — E1165 Type 2 diabetes mellitus with hyperglycemia: Secondary | ICD-10-CM

## 2021-10-04 DIAGNOSIS — I152 Hypertension secondary to endocrine disorders: Secondary | ICD-10-CM

## 2021-11-17 NOTE — Progress Notes (Addendum)
[facility])                                            [facility] Quality Pharmacy Team                                        Statin Quality Measure Assessment    11/17/2021  Jamie Strickland [birth year] [id number]  Per review of chart and payor information, this patient has been flagged for non-adherence to the following CMS Quality Measure:   [x]  Statin Use in Persons with Diabetes  []  Statin Use in Persons with Cardiovascular Disease  The 10-year ASCVD risk score (Arnett DK, et al., 2019) is: 57.6%   Values used to calculate the score:     Age: 76 years     Sex: Female     Is Non-Hispanic African American: No     Diabetic: Yes     Tobacco smoker: No     Systolic Blood Pressure: 189 mmHg     Is BP treated: Yes     HDL Cholesterol: 33 mg/dL     Total Cholesterol: 130 mg/dL  Atorvastatin remains on active med list and PCP noted patient is on medication at a recent O/v. Atorvastatin has not been filled in 2023; last prescription was written September 2022 with no refills. Last LDL 66 mg/dL (as of 2024). Patient is due for an updated lipid panel. If clinically appropriate, please consider assessing Atorvastatin prescription renewal and/or associate an exclusion code at the next O/v.   Next appointment with PCP: 11/18/2021  Please consider ONE of the following recommendations:   Initiate high intensity statin Atorvastatin 40mg  once daily, #90, 3 refills   Rosuvastatin 20mg  once daily, #90, 3 refills    Initiate moderate intensity          statin with reduced frequency if prior          statin intolerance 1x weekly, #13, 3 refills   2x weekly, #26, 3 refills   3x weekly, #39, 3 refills   Code for past statin intolerance or other exclusions (required annually)  Drug Induced Myopathy G72.0   Myositis, unspecified M60.9   Rhabdomyolysis M62.82   Cirrhosis of liver K74.69   Biliary cirrhosis, unspecified K74.5   Abnormal blood  glucose - for SUPD ONLY R73.09   Prediabetes - for SUPD ONLY  R73.03   Polycystic ovarian syndrome E28.2   Adverse effect of antihyperlipidemic and antiarteriosclerotic drugs, initial encounter 8786   Thank you for your time,  01/19/2022, PharmD Clinical Pharmacist [facility] Cell: [phone]

## 2021-11-18 ENCOUNTER — Ambulatory Visit: Payer: Medicare Other | Admitting: Physician Assistant

## 2021-11-18 DIAGNOSIS — E1165 Type 2 diabetes mellitus with hyperglycemia: Secondary | ICD-10-CM

## 2021-12-06 ENCOUNTER — Other Ambulatory Visit: Payer: Self-pay | Admitting: Physician Assistant

## 2021-12-06 DIAGNOSIS — E11319 Type 2 diabetes mellitus with unspecified diabetic retinopathy without macular edema: Secondary | ICD-10-CM

## 2021-12-06 DIAGNOSIS — E1165 Type 2 diabetes mellitus with hyperglycemia: Secondary | ICD-10-CM

## 2021-12-11 ENCOUNTER — Other Ambulatory Visit: Payer: Self-pay | Admitting: Physician Assistant

## 2021-12-11 DIAGNOSIS — J4531 Mild persistent asthma with (acute) exacerbation: Secondary | ICD-10-CM

## 2021-12-11 DIAGNOSIS — E11319 Type 2 diabetes mellitus with unspecified diabetic retinopathy without macular edema: Secondary | ICD-10-CM

## 2021-12-15 ENCOUNTER — Other Ambulatory Visit: Payer: Self-pay | Admitting: Physician Assistant

## 2022-01-15 ENCOUNTER — Other Ambulatory Visit: Payer: Self-pay | Admitting: Physician Assistant

## 2022-01-15 DIAGNOSIS — E1165 Type 2 diabetes mellitus with hyperglycemia: Secondary | ICD-10-CM

## 2022-01-15 DIAGNOSIS — E11319 Type 2 diabetes mellitus with unspecified diabetic retinopathy without macular edema: Secondary | ICD-10-CM

## 2022-01-27 ENCOUNTER — Ambulatory Visit: Payer: Medicare Other | Admitting: Physician Assistant

## 2022-02-06 ENCOUNTER — Other Ambulatory Visit: Payer: Self-pay | Admitting: Physician Assistant

## 2022-02-06 DIAGNOSIS — E11319 Type 2 diabetes mellitus with unspecified diabetic retinopathy without macular edema: Secondary | ICD-10-CM

## 2022-02-11 ENCOUNTER — Ambulatory Visit: Payer: Medicare Other | Admitting: Physician Assistant

## 2022-02-13 DIAGNOSIS — I200 Unstable angina: Secondary | ICD-10-CM | POA: Diagnosis not present

## 2022-02-13 DIAGNOSIS — I129 Hypertensive chronic kidney disease with stage 1 through stage 4 chronic kidney disease, or unspecified chronic kidney disease: Secondary | ICD-10-CM | POA: Diagnosis not present

## 2022-02-13 DIAGNOSIS — R062 Wheezing: Secondary | ICD-10-CM | POA: Diagnosis not present

## 2022-02-13 DIAGNOSIS — G3184 Mild cognitive impairment, so stated: Secondary | ICD-10-CM | POA: Diagnosis not present

## 2022-02-13 DIAGNOSIS — J45909 Unspecified asthma, uncomplicated: Secondary | ICD-10-CM | POA: Diagnosis not present

## 2022-02-13 DIAGNOSIS — I517 Cardiomegaly: Secondary | ICD-10-CM | POA: Diagnosis not present

## 2022-02-13 DIAGNOSIS — Z7902 Long term (current) use of antithrombotics/antiplatelets: Secondary | ICD-10-CM | POA: Diagnosis not present

## 2022-02-13 DIAGNOSIS — I10 Essential (primary) hypertension: Secondary | ICD-10-CM | POA: Diagnosis not present

## 2022-02-13 DIAGNOSIS — Z7982 Long term (current) use of aspirin: Secondary | ICD-10-CM | POA: Diagnosis not present

## 2022-02-13 DIAGNOSIS — M6281 Muscle weakness (generalized): Secondary | ICD-10-CM | POA: Diagnosis not present

## 2022-02-13 DIAGNOSIS — Z87891 Personal history of nicotine dependence: Secondary | ICD-10-CM | POA: Diagnosis not present

## 2022-02-13 DIAGNOSIS — I2089 Other forms of angina pectoris: Secondary | ICD-10-CM | POA: Diagnosis not present

## 2022-02-13 DIAGNOSIS — I509 Heart failure, unspecified: Secondary | ICD-10-CM | POA: Diagnosis not present

## 2022-02-13 DIAGNOSIS — E119 Type 2 diabetes mellitus without complications: Secondary | ICD-10-CM | POA: Diagnosis not present

## 2022-02-13 DIAGNOSIS — E1169 Type 2 diabetes mellitus with other specified complication: Secondary | ICD-10-CM | POA: Diagnosis not present

## 2022-02-13 DIAGNOSIS — Z955 Presence of coronary angioplasty implant and graft: Secondary | ICD-10-CM | POA: Diagnosis not present

## 2022-02-13 DIAGNOSIS — Z7984 Long term (current) use of oral hypoglycemic drugs: Secondary | ICD-10-CM | POA: Diagnosis not present

## 2022-02-13 DIAGNOSIS — Z48812 Encounter for surgical aftercare following surgery on the circulatory system: Secondary | ICD-10-CM | POA: Diagnosis not present

## 2022-02-13 DIAGNOSIS — I2510 Atherosclerotic heart disease of native coronary artery without angina pectoris: Secondary | ICD-10-CM | POA: Diagnosis not present

## 2022-02-13 DIAGNOSIS — M1990 Unspecified osteoarthritis, unspecified site: Secondary | ICD-10-CM | POA: Diagnosis not present

## 2022-02-13 DIAGNOSIS — J4530 Mild persistent asthma, uncomplicated: Secondary | ICD-10-CM | POA: Diagnosis not present

## 2022-02-13 DIAGNOSIS — R0600 Dyspnea, unspecified: Secondary | ICD-10-CM | POA: Diagnosis not present

## 2022-02-13 DIAGNOSIS — R262 Difficulty in walking, not elsewhere classified: Secondary | ICD-10-CM | POA: Diagnosis not present

## 2022-02-13 DIAGNOSIS — R071 Chest pain on breathing: Secondary | ICD-10-CM | POA: Diagnosis not present

## 2022-02-13 DIAGNOSIS — Z794 Long term (current) use of insulin: Secondary | ICD-10-CM | POA: Diagnosis not present

## 2022-02-13 DIAGNOSIS — E785 Hyperlipidemia, unspecified: Secondary | ICD-10-CM | POA: Diagnosis not present

## 2022-02-13 DIAGNOSIS — R7989 Other specified abnormal findings of blood chemistry: Secondary | ICD-10-CM | POA: Diagnosis not present

## 2022-02-13 DIAGNOSIS — I080 Rheumatic disorders of both mitral and aortic valves: Secondary | ICD-10-CM | POA: Diagnosis not present

## 2022-02-13 DIAGNOSIS — N3000 Acute cystitis without hematuria: Secondary | ICD-10-CM | POA: Diagnosis not present

## 2022-02-13 DIAGNOSIS — E1165 Type 2 diabetes mellitus with hyperglycemia: Secondary | ICD-10-CM | POA: Diagnosis not present

## 2022-02-13 DIAGNOSIS — N181 Chronic kidney disease, stage 1: Secondary | ICD-10-CM | POA: Diagnosis not present

## 2022-02-13 DIAGNOSIS — I160 Hypertensive urgency: Secondary | ICD-10-CM | POA: Diagnosis not present

## 2022-02-13 DIAGNOSIS — R0602 Shortness of breath: Secondary | ICD-10-CM | POA: Diagnosis not present

## 2022-02-13 DIAGNOSIS — J9611 Chronic respiratory failure with hypoxia: Secondary | ICD-10-CM | POA: Diagnosis not present

## 2022-02-13 DIAGNOSIS — R079 Chest pain, unspecified: Secondary | ICD-10-CM | POA: Diagnosis not present

## 2022-02-13 DIAGNOSIS — I25110 Atherosclerotic heart disease of native coronary artery with unstable angina pectoris: Secondary | ICD-10-CM | POA: Diagnosis not present

## 2022-02-20 DIAGNOSIS — M6281 Muscle weakness (generalized): Secondary | ICD-10-CM | POA: Diagnosis not present

## 2022-02-20 DIAGNOSIS — E1169 Type 2 diabetes mellitus with other specified complication: Secondary | ICD-10-CM | POA: Diagnosis not present

## 2022-02-20 DIAGNOSIS — M1990 Unspecified osteoarthritis, unspecified site: Secondary | ICD-10-CM | POA: Diagnosis not present

## 2022-02-20 DIAGNOSIS — J4530 Mild persistent asthma, uncomplicated: Secondary | ICD-10-CM | POA: Diagnosis not present

## 2022-02-20 DIAGNOSIS — Z7902 Long term (current) use of antithrombotics/antiplatelets: Secondary | ICD-10-CM | POA: Diagnosis not present

## 2022-02-20 DIAGNOSIS — R262 Difficulty in walking, not elsewhere classified: Secondary | ICD-10-CM | POA: Diagnosis not present

## 2022-02-20 DIAGNOSIS — I2510 Atherosclerotic heart disease of native coronary artery without angina pectoris: Secondary | ICD-10-CM | POA: Diagnosis not present

## 2022-02-20 DIAGNOSIS — Z48812 Encounter for surgical aftercare following surgery on the circulatory system: Secondary | ICD-10-CM | POA: Diagnosis not present

## 2022-02-20 DIAGNOSIS — E785 Hyperlipidemia, unspecified: Secondary | ICD-10-CM | POA: Diagnosis not present

## 2022-02-20 DIAGNOSIS — I10 Essential (primary) hypertension: Secondary | ICD-10-CM | POA: Diagnosis not present

## 2022-02-20 DIAGNOSIS — G3184 Mild cognitive impairment, so stated: Secondary | ICD-10-CM | POA: Diagnosis not present

## 2022-02-20 DIAGNOSIS — E1159 Type 2 diabetes mellitus with other circulatory complications: Secondary | ICD-10-CM | POA: Diagnosis not present

## 2022-02-20 DIAGNOSIS — Z794 Long term (current) use of insulin: Secondary | ICD-10-CM | POA: Diagnosis not present

## 2022-02-20 DIAGNOSIS — I152 Hypertension secondary to endocrine disorders: Secondary | ICD-10-CM | POA: Diagnosis not present

## 2022-02-20 DIAGNOSIS — Z7982 Long term (current) use of aspirin: Secondary | ICD-10-CM | POA: Diagnosis not present

## 2022-02-20 DIAGNOSIS — R52 Pain, unspecified: Secondary | ICD-10-CM | POA: Diagnosis not present

## 2022-02-20 DIAGNOSIS — E1165 Type 2 diabetes mellitus with hyperglycemia: Secondary | ICD-10-CM | POA: Diagnosis not present

## 2022-02-20 DIAGNOSIS — R110 Nausea: Secondary | ICD-10-CM | POA: Diagnosis not present

## 2022-02-20 DIAGNOSIS — I160 Hypertensive urgency: Secondary | ICD-10-CM | POA: Diagnosis not present

## 2022-02-20 DIAGNOSIS — Z955 Presence of coronary angioplasty implant and graft: Secondary | ICD-10-CM | POA: Diagnosis not present

## 2022-02-24 DIAGNOSIS — Z794 Long term (current) use of insulin: Secondary | ICD-10-CM | POA: Diagnosis not present

## 2022-02-24 DIAGNOSIS — E1165 Type 2 diabetes mellitus with hyperglycemia: Secondary | ICD-10-CM | POA: Diagnosis not present

## 2022-02-24 DIAGNOSIS — I152 Hypertension secondary to endocrine disorders: Secondary | ICD-10-CM | POA: Diagnosis not present

## 2022-02-24 DIAGNOSIS — E1159 Type 2 diabetes mellitus with other circulatory complications: Secondary | ICD-10-CM | POA: Diagnosis not present

## 2022-02-24 DIAGNOSIS — Z955 Presence of coronary angioplasty implant and graft: Secondary | ICD-10-CM | POA: Diagnosis not present

## 2022-02-26 DIAGNOSIS — E1165 Type 2 diabetes mellitus with hyperglycemia: Secondary | ICD-10-CM | POA: Diagnosis not present

## 2022-02-26 DIAGNOSIS — I2510 Atherosclerotic heart disease of native coronary artery without angina pectoris: Secondary | ICD-10-CM | POA: Diagnosis not present

## 2022-02-26 DIAGNOSIS — R52 Pain, unspecified: Secondary | ICD-10-CM | POA: Diagnosis not present

## 2022-02-26 DIAGNOSIS — Z794 Long term (current) use of insulin: Secondary | ICD-10-CM | POA: Diagnosis not present

## 2022-02-26 DIAGNOSIS — Z955 Presence of coronary angioplasty implant and graft: Secondary | ICD-10-CM | POA: Diagnosis not present

## 2022-02-26 DIAGNOSIS — E1169 Type 2 diabetes mellitus with other specified complication: Secondary | ICD-10-CM | POA: Diagnosis not present

## 2022-02-26 DIAGNOSIS — I10 Essential (primary) hypertension: Secondary | ICD-10-CM | POA: Diagnosis not present

## 2022-02-26 DIAGNOSIS — R262 Difficulty in walking, not elsewhere classified: Secondary | ICD-10-CM | POA: Diagnosis not present

## 2022-02-26 DIAGNOSIS — E1159 Type 2 diabetes mellitus with other circulatory complications: Secondary | ICD-10-CM | POA: Diagnosis not present

## 2022-02-26 DIAGNOSIS — I152 Hypertension secondary to endocrine disorders: Secondary | ICD-10-CM | POA: Diagnosis not present

## 2022-02-26 DIAGNOSIS — E785 Hyperlipidemia, unspecified: Secondary | ICD-10-CM | POA: Diagnosis not present

## 2022-02-26 DIAGNOSIS — R110 Nausea: Secondary | ICD-10-CM | POA: Diagnosis not present

## 2022-02-26 DIAGNOSIS — M6281 Muscle weakness (generalized): Secondary | ICD-10-CM | POA: Diagnosis not present

## 2022-03-02 DIAGNOSIS — I10 Essential (primary) hypertension: Secondary | ICD-10-CM | POA: Diagnosis not present

## 2022-03-02 DIAGNOSIS — G8929 Other chronic pain: Secondary | ICD-10-CM | POA: Diagnosis not present

## 2022-03-03 ENCOUNTER — Telehealth: Payer: Self-pay | Admitting: Neurology

## 2022-03-03 NOTE — Telephone Encounter (Signed)
Jamie Strickland with [facility] [phone]) called to get VO for home care nursing to see patient 2 x week for one week, and 1 x week for 8 weeks. They also want a social work referral. Patient told them she wasn't sure if Jamie Strickland was her PCP. She went to the hospital for hypertensive urgency. Needs some medication management assistance. Please advise if okay to give verbal order?

## 2022-03-03 NOTE — Telephone Encounter (Signed)
Tried to call Misty back, number just rang multiple times and disconnected.

## 2022-03-04 DIAGNOSIS — I10 Essential (primary) hypertension: Secondary | ICD-10-CM | POA: Diagnosis not present

## 2022-03-04 DIAGNOSIS — G8929 Other chronic pain: Secondary | ICD-10-CM | POA: Diagnosis not present

## 2022-03-04 NOTE — Telephone Encounter (Signed)
Called [facility] in [place] at [phone], she states they do not have a Misty in their office. They gave me an alernative number for a [facility] in [place] (she states there are multiple branches) [phone]. I tried to call this number and was sent to a "general vm". Will have to wait on a call back from nursing to provide Verbal orders.

## 2022-03-04 NOTE — Telephone Encounter (Signed)
Tried to call again, no answer, no voicemail, possible wrong number written down.

## 2022-03-12 DIAGNOSIS — Z955 Presence of coronary angioplasty implant and graft: Secondary | ICD-10-CM | POA: Diagnosis not present

## 2022-03-12 DIAGNOSIS — E785 Hyperlipidemia, unspecified: Secondary | ICD-10-CM | POA: Diagnosis not present

## 2022-03-12 DIAGNOSIS — I2510 Atherosclerotic heart disease of native coronary artery without angina pectoris: Secondary | ICD-10-CM | POA: Diagnosis not present

## 2022-03-12 DIAGNOSIS — E1169 Type 2 diabetes mellitus with other specified complication: Secondary | ICD-10-CM | POA: Diagnosis not present

## 2022-03-12 DIAGNOSIS — I10 Essential (primary) hypertension: Secondary | ICD-10-CM | POA: Diagnosis not present

## 2022-04-07 ENCOUNTER — Ambulatory Visit: Payer: Medicare Other | Admitting: Physician Assistant

## 2022-04-14 ENCOUNTER — Encounter: Payer: Self-pay | Admitting: Family Medicine

## 2022-04-14 ENCOUNTER — Ambulatory Visit (INDEPENDENT_AMBULATORY_CARE_PROVIDER_SITE_OTHER): Payer: Medicare Other | Admitting: Family Medicine

## 2022-04-14 VITALS — BP 163/102 | HR 83 | Ht 63.0 in

## 2022-04-14 DIAGNOSIS — Z885 Allergy status to narcotic agent status: Secondary | ICD-10-CM | POA: Diagnosis not present

## 2022-04-14 DIAGNOSIS — I2510 Atherosclerotic heart disease of native coronary artery without angina pectoris: Secondary | ICD-10-CM | POA: Diagnosis not present

## 2022-04-14 DIAGNOSIS — A419 Sepsis, unspecified organism: Secondary | ICD-10-CM | POA: Diagnosis not present

## 2022-04-14 DIAGNOSIS — E1165 Type 2 diabetes mellitus with hyperglycemia: Secondary | ICD-10-CM

## 2022-04-14 DIAGNOSIS — I152 Hypertension secondary to endocrine disorders: Secondary | ICD-10-CM | POA: Diagnosis not present

## 2022-04-14 DIAGNOSIS — E1169 Type 2 diabetes mellitus with other specified complication: Secondary | ICD-10-CM | POA: Diagnosis not present

## 2022-04-14 DIAGNOSIS — I700 Atherosclerosis of aorta: Secondary | ICD-10-CM | POA: Diagnosis not present

## 2022-04-14 DIAGNOSIS — E785 Hyperlipidemia, unspecified: Secondary | ICD-10-CM | POA: Diagnosis not present

## 2022-04-14 DIAGNOSIS — R4182 Altered mental status, unspecified: Secondary | ICD-10-CM | POA: Diagnosis not present

## 2022-04-14 DIAGNOSIS — F32A Depression, unspecified: Secondary | ICD-10-CM | POA: Diagnosis not present

## 2022-04-14 DIAGNOSIS — B372 Candidiasis of skin and nail: Secondary | ICD-10-CM | POA: Diagnosis not present

## 2022-04-14 DIAGNOSIS — R451 Restlessness and agitation: Secondary | ICD-10-CM | POA: Diagnosis not present

## 2022-04-14 DIAGNOSIS — N1831 Chronic kidney disease, stage 3a: Secondary | ICD-10-CM | POA: Diagnosis not present

## 2022-04-14 DIAGNOSIS — N3000 Acute cystitis without hematuria: Secondary | ICD-10-CM | POA: Diagnosis not present

## 2022-04-14 DIAGNOSIS — I129 Hypertensive chronic kidney disease with stage 1 through stage 4 chronic kidney disease, or unspecified chronic kidney disease: Secondary | ICD-10-CM | POA: Diagnosis not present

## 2022-04-14 DIAGNOSIS — I080 Rheumatic disorders of both mitral and aortic valves: Secondary | ICD-10-CM | POA: Diagnosis not present

## 2022-04-14 DIAGNOSIS — I10 Essential (primary) hypertension: Secondary | ICD-10-CM | POA: Diagnosis not present

## 2022-04-14 DIAGNOSIS — Z955 Presence of coronary angioplasty implant and graft: Secondary | ICD-10-CM | POA: Diagnosis not present

## 2022-04-14 DIAGNOSIS — N179 Acute kidney failure, unspecified: Secondary | ICD-10-CM | POA: Diagnosis not present

## 2022-04-14 DIAGNOSIS — R079 Chest pain, unspecified: Secondary | ICD-10-CM | POA: Diagnosis not present

## 2022-04-14 DIAGNOSIS — Z87891 Personal history of nicotine dependence: Secondary | ICD-10-CM | POA: Diagnosis not present

## 2022-04-14 DIAGNOSIS — N3289 Other specified disorders of bladder: Secondary | ICD-10-CM | POA: Diagnosis not present

## 2022-04-14 DIAGNOSIS — G3184 Mild cognitive impairment, so stated: Secondary | ICD-10-CM | POA: Diagnosis not present

## 2022-04-14 DIAGNOSIS — I130 Hypertensive heart and chronic kidney disease with heart failure and stage 1 through stage 4 chronic kidney disease, or unspecified chronic kidney disease: Secondary | ICD-10-CM | POA: Diagnosis not present

## 2022-04-14 DIAGNOSIS — J9811 Atelectasis: Secondary | ICD-10-CM | POA: Diagnosis not present

## 2022-04-14 DIAGNOSIS — G9341 Metabolic encephalopathy: Secondary | ICD-10-CM | POA: Diagnosis not present

## 2022-04-14 DIAGNOSIS — F02818 Dementia in other diseases classified elsewhere, unspecified severity, with other behavioral disturbance: Secondary | ICD-10-CM | POA: Diagnosis not present

## 2022-04-14 DIAGNOSIS — I5033 Acute on chronic diastolic (congestive) heart failure: Secondary | ICD-10-CM | POA: Diagnosis not present

## 2022-04-14 DIAGNOSIS — Z794 Long term (current) use of insulin: Secondary | ICD-10-CM | POA: Diagnosis not present

## 2022-04-14 DIAGNOSIS — E1159 Type 2 diabetes mellitus with other circulatory complications: Secondary | ICD-10-CM | POA: Diagnosis not present

## 2022-04-14 DIAGNOSIS — R54 Age-related physical debility: Secondary | ICD-10-CM | POA: Diagnosis not present

## 2022-04-14 DIAGNOSIS — I517 Cardiomegaly: Secondary | ICD-10-CM | POA: Diagnosis not present

## 2022-04-14 DIAGNOSIS — Z888 Allergy status to other drugs, medicaments and biological substances status: Secondary | ICD-10-CM | POA: Diagnosis not present

## 2022-04-14 DIAGNOSIS — Z9049 Acquired absence of other specified parts of digestive tract: Secondary | ICD-10-CM | POA: Diagnosis not present

## 2022-04-14 DIAGNOSIS — E1122 Type 2 diabetes mellitus with diabetic chronic kidney disease: Secondary | ICD-10-CM | POA: Diagnosis not present

## 2022-04-14 DIAGNOSIS — R040 Epistaxis: Secondary | ICD-10-CM | POA: Diagnosis not present

## 2022-04-14 DIAGNOSIS — N281 Cyst of kidney, acquired: Secondary | ICD-10-CM | POA: Diagnosis not present

## 2022-04-14 DIAGNOSIS — R531 Weakness: Secondary | ICD-10-CM | POA: Diagnosis not present

## 2022-04-14 NOTE — Assessment & Plan Note (Signed)
-   on exam, pt does not look well--she is pale, has pursed lip breathing (o2 sat 96%) and will not respond to my questions. She is only oriented to person but thinks she is 76 years old  - from history taking patient did have two falls today and this could be another cause of AMS and I discussed with patient's family that she would probably need a head scan as well as scan of her back due to her complaint of back pain  - also concern of UTI with the AMS she is presenting with . Family members say this AMS has been going on for two weeks.  - due to poorly controlled T2DM pt could also be experiencing HHS that is causing alteration. She did have an episode of diarrhea prior to our visit and family says she has been having diarrhea for a few days now and is defecting on herself which raises concern for spinal injury as well as infectious diarrhea causing AMS or dehydration.  - being that patient has a vast differential and unstable vitals today I advised family members to take pt to the ED to which they agreed. I explained that we need to figure out what is acutely causing her AMS and also put a plan in place with home health or social work for how she can get care in the home because she does require care to help with medication management

## 2022-04-14 NOTE — Progress Notes (Signed)
Acute Office Visit  Subjective:     Patient ID: ZULEYKA KLOC, female    DOB: [birth year], 76 y.o.   MRN: [id number]  Chief Complaint  Patient presents with   Follow-up    Pt c/o med check med     HPI Patient is in today for follow up along with concerns from family members. She presents in a wheelchair with her son, [name], and a lady named Hospital doctor.   HTN Pt's blood pressure is uncontrolled today at 162/123. Son does not know which medication patient is taking other than it is 4 medications. From Epic chart review on our epic system, pt is supposed to lisinopril 40mg , chlorthalidone 25mg , and hydralazine TID. However, according to [facility] she is supposed to be on lisinopril/hctz, spironolactone, toprol-xl, amlodipine, and hydralazine TID. Family members do not know what she is supposed to be on or is actually taking.   DM Per epic review, pt should be taking Tresiba: 40u am and 40u pm, Lispro: 25u TID before meals and metformin 100mg  XR. Son says he does not know if she is getting this. He says that he is giving the 40units of insulin at night but does not know what he is supposed to be doing with this. He also says he does not give her the mealtime insulin since he is at work and pt is left home during the day.   Mental Status Decline Family member has concerns of mental status decline for the past two weeks. Family does admit to falling twice today and patient complains of back pain. She did have a stent placed on October 9th but has not had any other significant events.   96 o2 sat   Review of Systems  Constitutional:  Negative for chills and fever.  Respiratory:  Negative for cough and shortness of breath.   Cardiovascular:  Negative for chest pain.  Musculoskeletal:  Positive for back pain.  Neurological:  Negative for headaches.      Objective:    BP (!) 163/102   Pulse 83   Ht 5\' 3"  (1.6 m)   SpO2 96%   BMI 40.39 kg/m    Physical Exam Vitals and nursing  note reviewed.  Constitutional:      General: She is not in acute distress.    Appearance: Normal appearance.     Comments: Presents in wheelchair  HENT:     Head: Normocephalic and atraumatic.     Right Ear: External ear normal.     Left Ear: External ear normal.     Nose: Nose normal.  Eyes:     Conjunctiva/sclera: Conjunctivae normal.  Cardiovascular:     Rate and Rhythm: Normal rate.     Heart sounds: Murmur heard.  Pulmonary:     Effort: Pulmonary effort is normal.     Breath sounds: Normal breath sounds.     Comments: Breathing with pursed lips Musculoskeletal:     Comments: Keeps moving around and can't sit still  Skin:    Comments: pale  Neurological:     Mental Status: She is alert.     Comments: Pt only oriented to self but thinks she is 76 years old. Unable to tell me where she is and is very slow to speak. She does not know time as well.   Psychiatric:        Mood and Affect: Mood normal.        Behavior: Behavior normal.  Thought Content: Thought content normal.        Judgment: Judgment normal.     No results found for any visits on 04/14/22.      Assessment & Plan:   Problem List Items Addressed This Visit       Cardiovascular and Mediastinum   Uncontrolled hypertension    - BP today is 162/123 and pt is unable to tell me if she is having any chest pain. She says she is not having any shortness of breath but has pursed lips on exam. O2 sat is 96%  - family members are confused about what medications she should be taking and it is difficult to find this out from chart review as [facility] and our epic systems have her on different medications. From what the son tells me, [facility] is supposed to be managing her blood pressure medication but he is unsure what medications she takes or even if she takes these medications.  - with her new AMS there is concern that it could be a HTN urgency issue and again I explained to family members to take patient  to the ED to which they agreed.          Endocrine   Uncontrolled type 2 diabetes mellitus with hyperglycemia [facility])    - family members have no idea what medication patient is taking or supposed to be taking. It sounds like she is getting one insulin at night but it is unclear if this is her long acting or meal time. Patient cannot answer any questions and from what it looks like she cannot take care of herself and would benefit from a higher level of care. Some concern for neglect as it seems like patient cannot take care of herself  - last A1C was 10.6 and with altered mental status on exam today I have concern for HHS that could be causing AMS. I have discussed and advised family members to take patient to the ED to figure out what is causing her AMS as it is very likely with uncontrolled DM and unclear if she is getting any of her medications it could very well be related to her DM         Other   Altered mental status - Primary    - on exam, pt does not look well--she is pale, has pursed lip breathing (o2 sat 96%) and will not respond to my questions. She is only oriented to person but thinks she is 76 years old  - from history taking patient did have two falls today and this could be another cause of AMS and I discussed with patient's family that she would probably need a head scan as well as scan of her back due to her complaint of back pain  - also concern of UTI with the AMS she is presenting with . Family members say this AMS has been going on for two weeks.  - due to poorly controlled T2DM pt could also be experiencing HHS that is causing alteration. She did have an episode of diarrhea prior to our visit and family says she has been having diarrhea for a few days now and is defecting on herself which raises concern for spinal injury as well as infectious diarrhea causing AMS or dehydration.  - being that patient has a vast differential and unstable vitals today I advised family members  to take pt to the ED to which they agreed. I explained that we need to figure out  what is acutely causing her AMS and also put a plan in place with home health or social work for how she can get care in the home because she does require care to help with medication management      Great amount of time about spent with the patient rooming and discussing with family members about plan of action as well as documentation   No orders of the defined types were placed in this encounter.   Return with PCP when PCP returns.  Charlton Amor, DO

## 2022-04-14 NOTE — Assessment & Plan Note (Signed)
-   family members have no idea what medication patient is taking or supposed to be taking. It sounds like she is getting one insulin at night but it is unclear if this is her long acting or meal time. Patient cannot answer any questions and from what it looks like she cannot take care of herself and would benefit from a higher level of care. Some concern for neglect as it seems like patient cannot take care of herself  - last A1C was 10.6 and with altered mental status on exam today I have concern for HHS that could be causing AMS. I have discussed and advised family members to take patient to the ED to figure out what is causing her AMS as it is very likely with uncontrolled DM and unclear if she is getting any of her medications it could very well be related to her DM

## 2022-04-14 NOTE — Assessment & Plan Note (Signed)
-   BP today is 162/123 and pt is unable to tell me if she is having any chest pain. She says she is not having any shortness of breath but has pursed lips on exam. O2 sat is 96%  - family members are confused about what medications she should be taking and it is difficult to find this out from chart review as [facility] and our epic systems have her on different medications. From what the son tells me, [facility] is supposed to be managing her blood pressure medication but he is unsure what medications she takes or even if she takes these medications.  - with her new AMS there is concern that it could be a HTN urgency issue and again I explained to family members to take patient to the ED to which they agreed.

## 2022-04-15 ENCOUNTER — Telehealth: Payer: Self-pay

## 2022-04-15 NOTE — Telephone Encounter (Signed)
Called pt's son to follow up with yesterdays visit , in regards to going to the emergency room, unable to reach pt  son  telephone number on file  phone is turned off.

## 2022-04-15 NOTE — Telephone Encounter (Signed)
Per providers' request - contacted [facility] [place] PD department for a wellness/welfare check on the following patient. Per provider - patient/son was advised to go to the ER due to severe diarrhea and complications with diabetes after their visit. Medical assistant called the available phone numbers earlier this morning. No answer. Unable to leave a vm msg. Direct contact information was provided to officer on call regarding any updates.

## 2022-04-20 DIAGNOSIS — R079 Chest pain, unspecified: Secondary | ICD-10-CM | POA: Diagnosis not present

## 2022-05-11 DIAGNOSIS — N3000 Acute cystitis without hematuria: Secondary | ICD-10-CM | POA: Diagnosis not present

## 2022-05-12 DIAGNOSIS — N3000 Acute cystitis without hematuria: Secondary | ICD-10-CM | POA: Diagnosis not present

## 2022-05-13 DIAGNOSIS — N3000 Acute cystitis without hematuria: Secondary | ICD-10-CM | POA: Diagnosis not present

## 2022-05-14 DIAGNOSIS — N3000 Acute cystitis without hematuria: Secondary | ICD-10-CM | POA: Diagnosis not present

## 2022-05-15 DIAGNOSIS — E1159 Type 2 diabetes mellitus with other circulatory complications: Secondary | ICD-10-CM | POA: Diagnosis not present

## 2022-05-15 DIAGNOSIS — E785 Hyperlipidemia, unspecified: Secondary | ICD-10-CM | POA: Diagnosis not present

## 2022-05-15 DIAGNOSIS — I152 Hypertension secondary to endocrine disorders: Secondary | ICD-10-CM | POA: Diagnosis not present

## 2022-05-15 DIAGNOSIS — E1169 Type 2 diabetes mellitus with other specified complication: Secondary | ICD-10-CM | POA: Diagnosis not present

## 2022-05-15 DIAGNOSIS — E1165 Type 2 diabetes mellitus with hyperglycemia: Secondary | ICD-10-CM | POA: Diagnosis not present

## 2022-05-15 DIAGNOSIS — I2510 Atherosclerotic heart disease of native coronary artery without angina pectoris: Secondary | ICD-10-CM | POA: Diagnosis not present

## 2022-05-15 DIAGNOSIS — Z794 Long term (current) use of insulin: Secondary | ICD-10-CM | POA: Diagnosis not present

## 2022-05-15 DIAGNOSIS — N1831 Chronic kidney disease, stage 3a: Secondary | ICD-10-CM | POA: Diagnosis not present

## 2022-05-16 DIAGNOSIS — E1165 Type 2 diabetes mellitus with hyperglycemia: Secondary | ICD-10-CM | POA: Diagnosis not present

## 2022-05-16 DIAGNOSIS — I2510 Atherosclerotic heart disease of native coronary artery without angina pectoris: Secondary | ICD-10-CM | POA: Diagnosis not present

## 2022-05-16 DIAGNOSIS — E1169 Type 2 diabetes mellitus with other specified complication: Secondary | ICD-10-CM | POA: Diagnosis not present

## 2022-05-16 DIAGNOSIS — N1831 Chronic kidney disease, stage 3a: Secondary | ICD-10-CM | POA: Diagnosis not present

## 2022-05-16 DIAGNOSIS — E1159 Type 2 diabetes mellitus with other circulatory complications: Secondary | ICD-10-CM | POA: Diagnosis not present

## 2022-05-17 DIAGNOSIS — E1165 Type 2 diabetes mellitus with hyperglycemia: Secondary | ICD-10-CM | POA: Diagnosis not present

## 2022-05-17 DIAGNOSIS — E1159 Type 2 diabetes mellitus with other circulatory complications: Secondary | ICD-10-CM | POA: Diagnosis not present

## 2022-05-17 DIAGNOSIS — I2510 Atherosclerotic heart disease of native coronary artery without angina pectoris: Secondary | ICD-10-CM | POA: Diagnosis not present

## 2022-05-17 DIAGNOSIS — E1169 Type 2 diabetes mellitus with other specified complication: Secondary | ICD-10-CM | POA: Diagnosis not present

## 2022-05-17 DIAGNOSIS — N1831 Chronic kidney disease, stage 3a: Secondary | ICD-10-CM | POA: Diagnosis not present

## 2022-05-18 DIAGNOSIS — N1831 Chronic kidney disease, stage 3a: Secondary | ICD-10-CM | POA: Diagnosis not present

## 2022-05-18 DIAGNOSIS — E1159 Type 2 diabetes mellitus with other circulatory complications: Secondary | ICD-10-CM | POA: Diagnosis not present

## 2022-05-18 DIAGNOSIS — E1169 Type 2 diabetes mellitus with other specified complication: Secondary | ICD-10-CM | POA: Diagnosis not present

## 2022-05-18 DIAGNOSIS — E1165 Type 2 diabetes mellitus with hyperglycemia: Secondary | ICD-10-CM | POA: Diagnosis not present

## 2022-05-18 DIAGNOSIS — I2510 Atherosclerotic heart disease of native coronary artery without angina pectoris: Secondary | ICD-10-CM | POA: Diagnosis not present

## 2022-05-19 DIAGNOSIS — N3000 Acute cystitis without hematuria: Secondary | ICD-10-CM | POA: Diagnosis not present

## 2022-05-20 DIAGNOSIS — N3000 Acute cystitis without hematuria: Secondary | ICD-10-CM | POA: Diagnosis not present

## 2022-05-21 DIAGNOSIS — N3000 Acute cystitis without hematuria: Secondary | ICD-10-CM | POA: Diagnosis not present

## 2022-05-22 DIAGNOSIS — N3000 Acute cystitis without hematuria: Secondary | ICD-10-CM | POA: Diagnosis not present

## 2022-05-23 DIAGNOSIS — N3000 Acute cystitis without hematuria: Secondary | ICD-10-CM | POA: Diagnosis not present

## 2022-05-24 DIAGNOSIS — N3000 Acute cystitis without hematuria: Secondary | ICD-10-CM | POA: Diagnosis not present

## 2022-05-25 DIAGNOSIS — N3000 Acute cystitis without hematuria: Secondary | ICD-10-CM | POA: Diagnosis not present

## 2022-05-26 DIAGNOSIS — E1159 Type 2 diabetes mellitus with other circulatory complications: Secondary | ICD-10-CM | POA: Diagnosis not present

## 2022-05-26 DIAGNOSIS — Z794 Long term (current) use of insulin: Secondary | ICD-10-CM | POA: Diagnosis not present

## 2022-05-26 DIAGNOSIS — I2510 Atherosclerotic heart disease of native coronary artery without angina pectoris: Secondary | ICD-10-CM | POA: Diagnosis not present

## 2022-05-26 DIAGNOSIS — I152 Hypertension secondary to endocrine disorders: Secondary | ICD-10-CM | POA: Diagnosis not present

## 2022-05-26 DIAGNOSIS — E1165 Type 2 diabetes mellitus with hyperglycemia: Secondary | ICD-10-CM | POA: Diagnosis not present

## 2022-05-26 DIAGNOSIS — N1831 Chronic kidney disease, stage 3a: Secondary | ICD-10-CM | POA: Diagnosis not present

## 2022-05-26 DIAGNOSIS — E785 Hyperlipidemia, unspecified: Secondary | ICD-10-CM | POA: Diagnosis not present

## 2022-05-26 DIAGNOSIS — E1169 Type 2 diabetes mellitus with other specified complication: Secondary | ICD-10-CM | POA: Diagnosis not present

## 2022-05-27 DIAGNOSIS — I2510 Atherosclerotic heart disease of native coronary artery without angina pectoris: Secondary | ICD-10-CM | POA: Diagnosis not present

## 2022-05-27 DIAGNOSIS — E1159 Type 2 diabetes mellitus with other circulatory complications: Secondary | ICD-10-CM | POA: Diagnosis not present

## 2022-05-27 DIAGNOSIS — E1169 Type 2 diabetes mellitus with other specified complication: Secondary | ICD-10-CM | POA: Diagnosis not present

## 2022-05-27 DIAGNOSIS — N1831 Chronic kidney disease, stage 3a: Secondary | ICD-10-CM | POA: Diagnosis not present

## 2022-05-27 DIAGNOSIS — E1165 Type 2 diabetes mellitus with hyperglycemia: Secondary | ICD-10-CM | POA: Diagnosis not present

## 2022-05-28 DIAGNOSIS — E1169 Type 2 diabetes mellitus with other specified complication: Secondary | ICD-10-CM | POA: Diagnosis not present

## 2022-05-28 DIAGNOSIS — I700 Atherosclerosis of aorta: Secondary | ICD-10-CM | POA: Diagnosis not present

## 2022-05-28 DIAGNOSIS — E1159 Type 2 diabetes mellitus with other circulatory complications: Secondary | ICD-10-CM | POA: Diagnosis not present

## 2022-05-28 DIAGNOSIS — I2510 Atherosclerotic heart disease of native coronary artery without angina pectoris: Secondary | ICD-10-CM | POA: Diagnosis not present

## 2022-05-28 DIAGNOSIS — E1165 Type 2 diabetes mellitus with hyperglycemia: Secondary | ICD-10-CM | POA: Diagnosis not present

## 2022-05-28 DIAGNOSIS — N1831 Chronic kidney disease, stage 3a: Secondary | ICD-10-CM | POA: Diagnosis not present

## 2022-05-29 DIAGNOSIS — E1165 Type 2 diabetes mellitus with hyperglycemia: Secondary | ICD-10-CM | POA: Diagnosis not present

## 2022-05-29 DIAGNOSIS — N1831 Chronic kidney disease, stage 3a: Secondary | ICD-10-CM | POA: Diagnosis not present

## 2022-05-29 DIAGNOSIS — E1159 Type 2 diabetes mellitus with other circulatory complications: Secondary | ICD-10-CM | POA: Diagnosis not present

## 2022-05-29 DIAGNOSIS — E1169 Type 2 diabetes mellitus with other specified complication: Secondary | ICD-10-CM | POA: Diagnosis not present

## 2022-05-29 DIAGNOSIS — I2510 Atherosclerotic heart disease of native coronary artery without angina pectoris: Secondary | ICD-10-CM | POA: Diagnosis not present

## 2022-05-30 DIAGNOSIS — E1169 Type 2 diabetes mellitus with other specified complication: Secondary | ICD-10-CM | POA: Diagnosis not present

## 2022-05-30 DIAGNOSIS — Z794 Long term (current) use of insulin: Secondary | ICD-10-CM | POA: Diagnosis not present

## 2022-05-30 DIAGNOSIS — I152 Hypertension secondary to endocrine disorders: Secondary | ICD-10-CM | POA: Diagnosis not present

## 2022-05-30 DIAGNOSIS — R531 Weakness: Secondary | ICD-10-CM | POA: Diagnosis not present

## 2022-05-30 DIAGNOSIS — N1831 Chronic kidney disease, stage 3a: Secondary | ICD-10-CM | POA: Diagnosis not present

## 2022-05-30 DIAGNOSIS — E1159 Type 2 diabetes mellitus with other circulatory complications: Secondary | ICD-10-CM | POA: Diagnosis not present

## 2022-05-30 DIAGNOSIS — F02818 Dementia in other diseases classified elsewhere, unspecified severity, with other behavioral disturbance: Secondary | ICD-10-CM | POA: Diagnosis not present

## 2022-05-30 DIAGNOSIS — E785 Hyperlipidemia, unspecified: Secondary | ICD-10-CM | POA: Diagnosis not present

## 2022-05-30 DIAGNOSIS — E1165 Type 2 diabetes mellitus with hyperglycemia: Secondary | ICD-10-CM | POA: Diagnosis not present

## 2022-05-30 DIAGNOSIS — I2510 Atherosclerotic heart disease of native coronary artery without angina pectoris: Secondary | ICD-10-CM | POA: Diagnosis not present

## 2022-05-30 DIAGNOSIS — Z955 Presence of coronary angioplasty implant and graft: Secondary | ICD-10-CM | POA: Diagnosis not present

## 2022-05-31 DIAGNOSIS — E1165 Type 2 diabetes mellitus with hyperglycemia: Secondary | ICD-10-CM | POA: Diagnosis not present

## 2022-05-31 DIAGNOSIS — N1831 Chronic kidney disease, stage 3a: Secondary | ICD-10-CM | POA: Diagnosis not present

## 2022-05-31 DIAGNOSIS — F02818 Dementia in other diseases classified elsewhere, unspecified severity, with other behavioral disturbance: Secondary | ICD-10-CM | POA: Diagnosis not present

## 2022-05-31 DIAGNOSIS — R531 Weakness: Secondary | ICD-10-CM | POA: Diagnosis not present

## 2022-06-01 DIAGNOSIS — E1165 Type 2 diabetes mellitus with hyperglycemia: Secondary | ICD-10-CM | POA: Diagnosis not present

## 2022-06-01 DIAGNOSIS — R531 Weakness: Secondary | ICD-10-CM | POA: Diagnosis not present

## 2022-06-01 DIAGNOSIS — N1831 Chronic kidney disease, stage 3a: Secondary | ICD-10-CM | POA: Diagnosis not present

## 2022-06-01 DIAGNOSIS — F02818 Dementia in other diseases classified elsewhere, unspecified severity, with other behavioral disturbance: Secondary | ICD-10-CM | POA: Diagnosis not present

## 2022-06-02 DIAGNOSIS — N3000 Acute cystitis without hematuria: Secondary | ICD-10-CM | POA: Diagnosis not present

## 2022-06-03 DIAGNOSIS — N3000 Acute cystitis without hematuria: Secondary | ICD-10-CM | POA: Diagnosis not present

## 2022-06-04 DIAGNOSIS — N3000 Acute cystitis without hematuria: Secondary | ICD-10-CM | POA: Diagnosis not present

## 2022-06-06 ENCOUNTER — Other Ambulatory Visit: Payer: Self-pay | Admitting: Physician Assistant

## 2022-06-06 DIAGNOSIS — I152 Hypertension secondary to endocrine disorders: Secondary | ICD-10-CM

## 2022-06-09 DIAGNOSIS — E119 Type 2 diabetes mellitus without complications: Secondary | ICD-10-CM | POA: Diagnosis not present

## 2022-06-09 DIAGNOSIS — E785 Hyperlipidemia, unspecified: Secondary | ICD-10-CM | POA: Diagnosis not present

## 2022-06-09 DIAGNOSIS — Z794 Long term (current) use of insulin: Secondary | ICD-10-CM | POA: Diagnosis not present

## 2022-06-09 DIAGNOSIS — I10 Essential (primary) hypertension: Secondary | ICD-10-CM | POA: Diagnosis not present

## 2022-06-09 DIAGNOSIS — J449 Chronic obstructive pulmonary disease, unspecified: Secondary | ICD-10-CM | POA: Diagnosis not present

## 2022-06-09 DIAGNOSIS — M150 Primary generalized (osteo)arthritis: Secondary | ICD-10-CM | POA: Diagnosis not present

## 2022-06-11 DIAGNOSIS — E1169 Type 2 diabetes mellitus with other specified complication: Secondary | ICD-10-CM | POA: Diagnosis not present

## 2022-06-11 DIAGNOSIS — J9611 Chronic respiratory failure with hypoxia: Secondary | ICD-10-CM | POA: Diagnosis not present

## 2022-06-11 DIAGNOSIS — E1159 Type 2 diabetes mellitus with other circulatory complications: Secondary | ICD-10-CM | POA: Diagnosis not present

## 2022-06-11 DIAGNOSIS — J4530 Mild persistent asthma, uncomplicated: Secondary | ICD-10-CM | POA: Diagnosis not present

## 2022-06-11 DIAGNOSIS — J4489 Other specified chronic obstructive pulmonary disease: Secondary | ICD-10-CM | POA: Diagnosis not present

## 2022-06-11 DIAGNOSIS — E785 Hyperlipidemia, unspecified: Secondary | ICD-10-CM | POA: Diagnosis not present

## 2022-06-16 DIAGNOSIS — E559 Vitamin D deficiency, unspecified: Secondary | ICD-10-CM | POA: Diagnosis not present

## 2022-06-16 DIAGNOSIS — Z79899 Other long term (current) drug therapy: Secondary | ICD-10-CM | POA: Diagnosis not present

## 2022-06-28 ENCOUNTER — Emergency Department (HOSPITAL_COMMUNITY)
Admission: EM | Admit: 2022-06-28 | Discharge: 2022-06-28 | Disposition: A | Discharge status: Home / Self Care | Payer: Medicare Other | Attending: Emergency Medicine | Admitting: Emergency Medicine

## 2022-06-28 ENCOUNTER — Other Ambulatory Visit: Payer: Self-pay

## 2022-06-28 DIAGNOSIS — F03A0 Unspecified dementia, mild, without behavioral disturbance, psychotic disturbance, mood disturbance, and anxiety: Secondary | ICD-10-CM | POA: Diagnosis not present

## 2022-06-28 DIAGNOSIS — E1165 Type 2 diabetes mellitus with hyperglycemia: Secondary | ICD-10-CM | POA: Insufficient documentation

## 2022-06-28 DIAGNOSIS — I10 Essential (primary) hypertension: Secondary | ICD-10-CM | POA: Insufficient documentation

## 2022-06-28 DIAGNOSIS — Z7982 Long term (current) use of aspirin: Secondary | ICD-10-CM | POA: Insufficient documentation

## 2022-06-28 DIAGNOSIS — Z7401 Bed confinement status: Secondary | ICD-10-CM | POA: Diagnosis not present

## 2022-06-28 DIAGNOSIS — Z794 Long term (current) use of insulin: Secondary | ICD-10-CM | POA: Insufficient documentation

## 2022-06-28 DIAGNOSIS — Z7902 Long term (current) use of antithrombotics/antiplatelets: Secondary | ICD-10-CM | POA: Insufficient documentation

## 2022-06-28 DIAGNOSIS — Z79899 Other long term (current) drug therapy: Secondary | ICD-10-CM | POA: Diagnosis not present

## 2022-06-28 DIAGNOSIS — Z7984 Long term (current) use of oral hypoglycemic drugs: Secondary | ICD-10-CM | POA: Insufficient documentation

## 2022-06-28 DIAGNOSIS — N3000 Acute cystitis without hematuria: Secondary | ICD-10-CM

## 2022-06-28 DIAGNOSIS — R739 Hyperglycemia, unspecified: Secondary | ICD-10-CM | POA: Diagnosis not present

## 2022-06-28 DIAGNOSIS — Z743 Need for continuous supervision: Secondary | ICD-10-CM | POA: Diagnosis not present

## 2022-06-28 LAB — BLOOD GAS, VENOUS
Acid-base deficit: 0.2 mmol/L (ref 0.0–2.0)
Bicarbonate: 25.4 mmol/L (ref 20.0–28.0)
O2 Saturation: 73.7 %
Patient temperature: 37
pCO2, Ven: 44 mmHg (ref 44–60)
pH, Ven: 7.37 (ref 7.25–7.43)
pO2, Ven: 44 mmHg (ref 32–45)

## 2022-06-28 LAB — CBC WITH DIFFERENTIAL/PLATELET
Abs Immature Granulocytes: 0.1 10*3/uL — ABNORMAL HIGH (ref 0.00–0.07)
Basophils Absolute: 0 10*3/uL (ref 0.0–0.1)
Basophils Relative: 0 %
Eosinophils Absolute: 0.1 10*3/uL (ref 0.0–0.5)
Eosinophils Relative: 1 %
HCT: 42 % (ref 36.0–46.0)
Hemoglobin: 14.1 g/dL (ref 12.0–15.0)
Immature Granulocytes: 1 %
Lymphocytes Relative: 14 %
Lymphs Abs: 1.4 10*3/uL (ref 0.7–4.0)
MCH: 30.4 pg (ref 26.0–34.0)
MCHC: 33.6 g/dL (ref 30.0–36.0)
MCV: 90.5 fL (ref 80.0–100.0)
Monocytes Absolute: 0.6 10*3/uL (ref 0.1–1.0)
Monocytes Relative: 6 %
Neutro Abs: 7.6 10*3/uL (ref 1.7–7.7)
Neutrophils Relative %: 78 %
Platelets: 323 10*3/uL (ref 150–400)
RBC: 4.64 MIL/uL (ref 3.87–5.11)
RDW: 12.9 % (ref 11.5–15.5)
WBC: 9.8 10*3/uL (ref 4.0–10.5)
nRBC: 0 % (ref 0.0–0.2)

## 2022-06-28 LAB — BASIC METABOLIC PANEL
Anion gap: 13 (ref 5–15)
BUN: 36 mg/dL — ABNORMAL HIGH (ref 8–23)
CO2: 23 mmol/L (ref 22–32)
Calcium: 9.5 mg/dL (ref 8.9–10.3)
Chloride: 94 mmol/L — ABNORMAL LOW (ref 98–111)
Creatinine, Ser: 1.04 mg/dL — ABNORMAL HIGH (ref 0.44–1.00)
GFR, Estimated: 56 mL/min — ABNORMAL LOW (ref >60)
Glucose, Bld: 494 mg/dL — ABNORMAL HIGH (ref 70–99)
Potassium: 4.5 mmol/L (ref 3.5–5.1)
Sodium: 130 mmol/L — ABNORMAL LOW (ref 135–145)

## 2022-06-28 LAB — URINALYSIS, ROUTINE W REFLEX MICROSCOPIC
Bilirubin Urine: NEGATIVE
Glucose, UA: >=500 mg/dL — AB
Hgb urine dipstick: NEGATIVE
Ketones, ur: NEGATIVE mg/dL
Nitrite: NEGATIVE
Protein, ur: NEGATIVE mg/dL
Specific Gravity, Urine: 1.021 (ref 1.005–1.030)
pH: 5 (ref 5.0–8.0)

## 2022-06-28 LAB — CBG MONITORING, ED
Glucose-Capillary: 507 mg/dL (ref 70–99)
Glucose-Capillary: 296 mg/dL — ABNORMAL HIGH (ref 70–99)

## 2022-06-28 LAB — BETA-HYDROXYBUTYRIC ACID: Beta-Hydroxybutyric Acid: 0.4 mmol/L — ABNORMAL HIGH (ref 0.05–0.27)

## 2022-06-28 LAB — TROPONIN I (HIGH SENSITIVITY)
Troponin I (High Sensitivity): 12 ng/L (ref <18)
Troponin I (High Sensitivity): 12 ng/L (ref <18)

## 2022-06-28 MED ORDER — LORAZEPAM 2 MG/ML IJ SOLN
1.0000 mg | Freq: Once | INTRAMUSCULAR | Status: AC
  Administered 2022-06-28: 1 mg via INTRAVENOUS
  Filled 2022-06-28: qty 1

## 2022-06-28 MED ORDER — CEPHALEXIN 500 MG PO CAPS: 500.0000 mg | ORAL_CAPSULE | Freq: Four times a day (QID) | ORAL | 0 refills | Status: DC

## 2022-06-28 MED ORDER — CEPHALEXIN 500 MG PO CAPS
500.0000 mg | ORAL_CAPSULE | Freq: Once | ORAL | Status: AC
  Administered 2022-06-28: 500 mg via ORAL
  Filled 2022-06-28: qty 1

## 2022-06-28 MED ORDER — LACTATED RINGERS IV BOLUS
1000.0000 mL | Freq: Once | INTRAVENOUS | Status: AC
  Administered 2022-06-28: 1000 mL via INTRAVENOUS

## 2022-06-28 MED ORDER — LACTATED RINGERS IV BOLUS: 20.0000 mL/kg | Freq: Once | INTRAVENOUS | Status: DC

## 2022-06-28 NOTE — ED Notes (Signed)
Pt yelling "help" and when staff responds, she states that she needs to stand up. Pt continually removes BP cuff and pulse ox and refuses to let staff update vitals. Pt is visibly agitated and threatens to crawl over stretcher side rail. Multiple attempts made to redirect pt and educate on importance of using the call bell rather than yelling and/or climbing out of bed unassisted. Pt states she "don't give a damn".

## 2022-06-28 NOTE — ED Provider Notes (Signed)
[facility] EMERGENCY DEPARTMENT AT [facility] Provider Note   CSN: [id number] Arrival date & time: 06/28/22  1620     History  Chief Complaint  Patient presents with   Hyperglycemia    Jamie Strickland is a 77 y.o. female.   Hyperglycemia    78 year old female with medical history significant for diabetes mellitus, HTN, HLD, mild dementia who currently resides at an assisted living facility who presents to the emergency department for hyperglycemia.  Patient and per EMS facility had declined any complaint other than hyperglycemia.  She presents from [place] via EMS with a CBG reading high.  She denies any infectious etiology, denies any dysuria but she does endorse increased urinary frequency although history is limited by the patient's dementia.  She denies any abdominal pain, nausea, vomiting, diarrhea, fever or chills.  I discussed the care of the patient with the patient's son Jamie Strickland who is also her healthcare power of attorney.  He states that she is currently been at her baseline mental status.  She primarily was sent out for hyperglycemia.  Family are currently  comfortable with her care in assisted living, are currently working outpatient with an eventual plan for outpatient skilled nursing placement.  Home Medications Prior to Admission medications   Medication Sig Start Date End Date Taking? Authorizing Provider  cephALEXin (KEFLEX) 500 MG capsule Take 1 capsule (500 mg total) by mouth 4 (four) times daily. 06/28/22  Yes Regan Lemming, MD  ACCU-CHEK GUIDE test strip TEST UP TO 4 TIMES DAILY AS DIRECTED 12/08/21   Breeback, Jade L, PA-C  albuterol (PROAIR HFA) 108 (90 Base) MCG/ACT inhaler INHALE 1-2 PUFFS INTO THE LUNGS EVERY 4 (FOUR) HOURS AS NEEDED FOR WHEEZING OR SHORTNESS OF BREATH 06/07/19   Breeback, Jade L, PA-C  albuterol (PROVENTIL) (2.5 MG/3ML) 0.083% nebulizer solution USE 1 VIAL VIA NEBULIZER EVERY 4 HOURS AS NEEDED FOR WHEEZING/SHORTNESS OF  BREATH 09/01/21   Breeback, Jade L, PA-C  amLODipine (NORVASC) 10 MG tablet TAKE 1 TABLET BY MOUTH EVERY DAY 03/28/21   Breeback, Jade L, PA-C  aspirin EC 81 MG tablet Take 81 mg by mouth daily. Patient not taking: Reported on 07/28/2021    [provider]  atorvastatin (LIPITOR) 20 MG tablet TAKE 1 TABLET BY MOUTH EVERY DAY/ needs labs 01/30/21   Iran Planas L, PA-C  ATROVENT HFA 17 MCG/ACT inhaler INHALE 2-3 PUFFS INTO THE LUNGS EVERY 6 HOURS AS NEEDED FOR WHEEZING. 12/11/21   Breeback, Jade L, PA-C  BD INSULIN SYRINGE U/F 31G X 5/16" 0.5 ML MISC USE AS DIRECTED 08/28/21   Breeback, Jade L, PA-C  BD PEN NEEDLE NANO 2ND GEN 32G X 4 MM MISC FOR WEEKLY INJECTION WITH OZEMPIC 12/16/21   Breeback, Jade L, PA-C  blood glucose meter kit and supplies Dispense based on patient and insurance preference. Use up to four times daily as directed. (FOR ICD-10 E10.9, E11.9). 03/06/20   Breeback, Jade L, PA-C  carvedilol (COREG) 12.5 MG tablet Take 1 tablet (12.5 mg total) by mouth 2 (two) times daily with a meal. 01/29/20   Breeback, Jade L, PA-C  chlorthalidone (HYGROTON) 25 MG tablet TAKE 1 TABLET (25 MG TOTAL) BY MOUTH DAILY. 05/06/21   Donella Stade, PA-C  clopidogrel (PLAVIX) 75 MG tablet Take 1 tablet (75 mg total) by mouth daily. 01/29/20   Breeback, Jade L, PA-C  fluticasone (FLONASE) 50 MCG/ACT nasal spray SPRAY 2 SPRAYS INTO EACH NOSTRIL EVERY DAY 05/19/21   Donella Stade,  PA-C  glipiZIDE (GLUCOTROL) 10 MG tablet TAKE 1 TABLET (10 MG TOTAL) BY MOUTH 2 (TWO) TIMES DAILY BEFORE A MEAL. 01/30/21   Breeback, Jade L, PA-C  hydrALAZINE (APRESOLINE) 50 MG tablet TAKE 1 TABLET BY MOUTH THREE TIMES A DAY 08/28/21   Breeback, Jade L, PA-C  insulin degludec (TRESIBA FLEXTOUCH) 200 UNIT/ML FlexTouch Pen INJECT 40 UNITS IN MORNING & AT BEDTIME-TITRATE BY 2UNITS EVERY 3 DAYS UNTIL FASTING GLUCOSE 90-130/ NEEDS APPT 01/15/22   Iran Planas L, PA-C  insulin lispro (HUMALOG KWIKPEN) 200 UNIT/ML KwikPen Inject 25-50  Units into the skin 3 (three) times daily before meals. Needs appt 01/16/22   Iran Planas L, PA-C  insulin lispro (HUMALOG) 100 UNIT/ML injection INJECT 25 UNITS INTO THE SKIN 3 TIMES DAILY BEFORE MEALS 12/11/21   Breeback, Jade L, PA-C  levocetirizine (XYZAL) 5 MG tablet TAKE 1 TABLET BY MOUTH EVERY DAY IN THE EVENING 01/30/21   Breeback, Jade L, PA-C  lisinopril (ZESTRIL) 40 MG tablet Take 1 tablet (40 mg total) by mouth daily. Needs appointment. 06/08/22   Breeback, Jade L, PA-C  metFORMIN (GLUCOPHAGE-XR) 500 MG 24 hr tablet TAKE 2 TABLETS (1,000 MG TOTAL) BY MOUTH 2 (TWO) TIMES DAILY WITH A MEAL. 05/06/21   Breeback, Jade L, PA-C  montelukast (SINGULAIR) 10 MG tablet TAKE 1 TABLET BY MOUTH EVERYDAY AT BEDTIME 01/30/21   Breeback, Jade L, PA-C  WIXELA INHUB 250-50 MCG/ACT AEPB INHALE 1 PUFF BY MOUTH TWICE A DAY 01/30/21   Breeback, Jade L, PA-C      Allergies    Invokana [canagliflozin], Trulicity [dulaglutide], and Codeine    Review of Systems   Review of Systems  Unable to perform ROS: Dementia    Physical Exam Updated Vital Signs BP (!) 144/64   Pulse 78   Temp 98.5 F (36.9 C) (Oral)   Resp 15   SpO2 100%  Physical Exam Vitals and nursing note reviewed.  Constitutional:      General: She is not in acute distress.    Appearance: She is well-developed.     Comments: GCS 14, at baseline  HENT:     Head: Normocephalic and atraumatic.  Eyes:     Conjunctiva/sclera: Conjunctivae normal.  Cardiovascular:     Rate and Rhythm: Normal rate and regular rhythm.  Pulmonary:     Effort: Pulmonary effort is normal. No respiratory distress.     Breath sounds: Normal breath sounds.  Abdominal:     Palpations: Abdomen is soft.     Tenderness: There is no abdominal tenderness.  Musculoskeletal:        General: No swelling.     Cervical back: Neck supple.  Skin:    General: Skin is warm and dry.     Capillary Refill: Capillary refill takes less than 2 seconds.  Neurological:      Mental Status: She is alert and oriented to person, place, and time. Mental status is at baseline.     Cranial Nerves: No cranial nerve deficit.     Sensory: No sensory deficit.     Motor: No weakness.     Comments: GCS 14  Psychiatric:        Mood and Affect: Mood normal.     ED Results / Procedures / Treatments   Labs (all labs ordered are listed, but only abnormal results are displayed) Labs Reviewed  BASIC METABOLIC PANEL - Abnormal; Notable for the following components:      Result Value   Sodium 130 (*)  Chloride 94 (*)    Glucose, Bld 494 (*)    BUN 36 (*)    Creatinine, Ser 1.04 (*)    GFR, Estimated 56 (*)    All other components within normal limits  BETA-HYDROXYBUTYRIC ACID - Abnormal; Notable for the following components:   Beta-Hydroxybutyric Acid 0.40 (*)    All other components within normal limits  CBC WITH DIFFERENTIAL/PLATELET - Abnormal; Notable for the following components:   Abs Immature Granulocytes 0.10 (*)    All other components within normal limits  URINALYSIS, ROUTINE W REFLEX MICROSCOPIC - Abnormal; Notable for the following components:   Color, Urine COLORLESS (*)    Glucose, UA >=500 (*)    Leukocytes,Ua SMALL (*)    Bacteria, UA RARE (*)    All other components within normal limits  CBG MONITORING, ED - Abnormal; Notable for the following components:   Glucose-Capillary 507 (*)    All other components within normal limits  CBG MONITORING, ED - Abnormal; Notable for the following components:   Glucose-Capillary 296 (*)    All other components within normal limits  BLOOD GAS, VENOUS  BASIC METABOLIC PANEL  BASIC METABOLIC PANEL  BASIC METABOLIC PANEL  BETA-HYDROXYBUTYRIC ACID  BASIC METABOLIC PANEL  BETA-HYDROXYBUTYRIC ACID  TROPONIN I (HIGH SENSITIVITY)  TROPONIN I (HIGH SENSITIVITY)    EKG None  Radiology No results found.  Procedures Procedures    Medications Ordered in ED Medications  lactated ringers bolus 1,000 mL (0  mLs Intravenous Stopped 06/28/22 1905)  LORazepam (ATIVAN) injection 1 mg (1 mg Intravenous Given 06/28/22 1936)  cephALEXin (KEFLEX) capsule 500 mg (500 mg Oral Given 06/28/22 2217)    ED Course/ Medical Decision Making/ A&P Clinical Course as of 06/28/22 2249  Sun Jun 28, 2022  2125 Glucose-Capillary(!): 296 [JL]    Clinical Course User Index [JL] Regan Lemming, MD                             Medical Decision Making Amount and/or Complexity of Data Reviewed Labs: ordered. Decision-making details documented in ED Course.  Risk Prescription drug management.   77 year old female with medical history significant for diabetes mellitus, HTN, HLD, mild dementia who currently resides at an assisted living facility who presents to the emergency department for hyperglycemia.  Patient and per EMS facility had declined any complaint other than hyperglycemia.  She presents from [place] via EMS with a CBG reading high.  She denies any infectious etiology, denies any dysuria but she does endorse increased urinary frequency although history is limited by the patient's dementia.  She denies any abdominal pain, nausea, vomiting, diarrhea, fever or chills.  I discussed the care of the patient with the patient's son Jamie Strickland who is also her healthcare power of attorney.  He states that she is currently been at her baseline mental status.  She primarily was sent out for hyperglycemia.  Family are currently  comfortable with her care in assisted living, are currently working outpatient with an eventual plan for outpatient skilled nursing placement.  Also  On arrival, the patient was mildly tachycardic heart rate 108, BP 145/66, saturating 94% on room air.  The patient's initial CBG was 507.  Concern for hyperglycemia versus DKA.  DKA workup initiated to include VBG with normal pH at 7.37, BMP without an anion gap acidosis with hyperglycemia to 494 noted, bicarbonate 23, anion gap 13, serum creatinine  at baseline at 1.04.  CBC was  without leukocytosis or anemia, cardiac troponin was normal.  The patient was administered a liter of IV fluids with subsequent downtrending in her blood glucose to 296.  Urinalysis resulted positive with small leukocytes, 11-20 WBCs and rare bacteria which in the setting of the patient's  uncontrolled hyperglycemia and increased frequency is equivocal for UTI vs symptoms of hyperglycemia. We will go ahead and treat for developing urinary tract infection.    Discussed the plan of likely discharge back to her assisted living facility with the patient's son who is comfortable with that plan of care.  Patient remains at her baseline mental status.  Overall at this time, no indication for inpatient hospitalization.  Will discharge the patient on a course of Keflex, will continue to have her follow-up for diabetes management through her assisted living facility.   Final Clinical Impression(s) / ED Diagnoses Final diagnoses:  Mild dementia without behavioral disturbance, psychotic disturbance, mood disturbance, or anxiety, unspecified dementia type ([place])  Hyperglycemia  Acute cystitis without hematuria    Rx / DC Orders ED Discharge Orders          Ordered    cephALEXin (KEFLEX) 500 MG capsule  4 times daily        06/28/22 2127              Regan Lemming, MD 06/28/22 2250

## 2022-06-28 NOTE — ED Triage Notes (Signed)
EMS reports from [place] called out for Hyperglycemia. Hx of widely fluctuating CBG per staff. Pt denies any complaint.  BP 134/94 HR 66 RR 18 Sp02 96 RA CBG "High" Temp 98.6

## 2022-06-28 NOTE — Discharge Instructions (Addendum)
Your blood glucose was elevated with no evidence of diabetic ketoacidosis.  Your baseline mental status.  You are safe for discharge back to your assisted living facility.  Continue to work with caregivers at your assisted living facility for management of your diabetes.  Your urine showed evidence of urinary tract infection.  Will treat with a course of Keflex.  Return for any worsening symptoms or any other concerns.

## 2022-06-29 MED ORDER — CEPHALEXIN 500 MG PO CAPS: 500.0000 mg | ORAL_CAPSULE | Freq: Four times a day (QID) | ORAL | 0 refills | Status: DC

## 2022-07-01 DIAGNOSIS — E1169 Type 2 diabetes mellitus with other specified complication: Secondary | ICD-10-CM | POA: Diagnosis not present

## 2022-07-01 DIAGNOSIS — E785 Hyperlipidemia, unspecified: Secondary | ICD-10-CM | POA: Diagnosis not present

## 2022-07-01 DIAGNOSIS — J4489 Other specified chronic obstructive pulmonary disease: Secondary | ICD-10-CM | POA: Diagnosis not present

## 2022-07-02 ENCOUNTER — Telehealth: Payer: Self-pay

## 2022-07-02 NOTE — Telephone Encounter (Signed)
     Patient  visit on 2/18  at [facility] you been able to follow up with your primary care physician? Yes   The patient was or was not able to obtain any needed medicine or equipment. Yes   Are there diet recommendations that you are having difficulty following? na  Patient expresses understanding of discharge instructions and education provided has no other needs at this time.  Yes      [facility] [id number], [place], [place] [address] Phone: [phone] Email: [email or web address]

## 2022-07-06 ENCOUNTER — Telehealth: Payer: Self-pay | Admitting: Physician Assistant

## 2022-07-06 NOTE — Telephone Encounter (Signed)
Contacted [facility] to schedule their annual wellness visit. Call back at later date: Patient's son requested a later date due to patient living in an assisted facility.  Jamie Strickland Patient [employer] administrator II Direct Dial: [phone]

## 2022-07-07 DIAGNOSIS — M150 Primary generalized (osteo)arthritis: Secondary | ICD-10-CM | POA: Diagnosis not present

## 2022-07-07 DIAGNOSIS — Z794 Long term (current) use of insulin: Secondary | ICD-10-CM | POA: Diagnosis not present

## 2022-07-07 DIAGNOSIS — E782 Mixed hyperlipidemia: Secondary | ICD-10-CM | POA: Diagnosis not present

## 2022-07-07 DIAGNOSIS — E785 Hyperlipidemia, unspecified: Secondary | ICD-10-CM | POA: Diagnosis not present

## 2022-07-07 DIAGNOSIS — I10 Essential (primary) hypertension: Secondary | ICD-10-CM | POA: Diagnosis not present

## 2022-07-07 DIAGNOSIS — E1169 Type 2 diabetes mellitus with other specified complication: Secondary | ICD-10-CM | POA: Diagnosis not present

## 2022-07-07 DIAGNOSIS — J449 Chronic obstructive pulmonary disease, unspecified: Secondary | ICD-10-CM | POA: Diagnosis not present

## 2022-07-07 DIAGNOSIS — E119 Type 2 diabetes mellitus without complications: Secondary | ICD-10-CM | POA: Diagnosis not present

## 2022-07-15 DIAGNOSIS — J449 Chronic obstructive pulmonary disease, unspecified: Secondary | ICD-10-CM | POA: Diagnosis not present

## 2022-07-15 DIAGNOSIS — I10 Essential (primary) hypertension: Secondary | ICD-10-CM | POA: Diagnosis not present

## 2022-07-15 DIAGNOSIS — M170 Bilateral primary osteoarthritis of knee: Secondary | ICD-10-CM | POA: Diagnosis not present

## 2022-07-15 DIAGNOSIS — E119 Type 2 diabetes mellitus without complications: Secondary | ICD-10-CM | POA: Diagnosis not present

## 2022-07-15 DIAGNOSIS — E782 Mixed hyperlipidemia: Secondary | ICD-10-CM | POA: Diagnosis not present

## 2022-08-04 DIAGNOSIS — E785 Hyperlipidemia, unspecified: Secondary | ICD-10-CM | POA: Diagnosis not present

## 2022-08-04 DIAGNOSIS — J449 Chronic obstructive pulmonary disease, unspecified: Secondary | ICD-10-CM | POA: Diagnosis not present

## 2022-08-04 DIAGNOSIS — M170 Bilateral primary osteoarthritis of knee: Secondary | ICD-10-CM | POA: Diagnosis not present

## 2022-08-04 DIAGNOSIS — I10 Essential (primary) hypertension: Secondary | ICD-10-CM | POA: Diagnosis not present

## 2022-08-04 DIAGNOSIS — E119 Type 2 diabetes mellitus without complications: Secondary | ICD-10-CM | POA: Diagnosis not present

## 2022-08-04 DIAGNOSIS — Z794 Long term (current) use of insulin: Secondary | ICD-10-CM | POA: Diagnosis not present

## 2022-08-04 DIAGNOSIS — E782 Mixed hyperlipidemia: Secondary | ICD-10-CM | POA: Diagnosis not present

## 2022-08-25 DIAGNOSIS — M170 Bilateral primary osteoarthritis of knee: Secondary | ICD-10-CM | POA: Diagnosis not present

## 2022-08-25 DIAGNOSIS — Z794 Long term (current) use of insulin: Secondary | ICD-10-CM | POA: Diagnosis not present

## 2022-08-25 DIAGNOSIS — I10 Essential (primary) hypertension: Secondary | ICD-10-CM | POA: Diagnosis not present

## 2022-08-25 DIAGNOSIS — E119 Type 2 diabetes mellitus without complications: Secondary | ICD-10-CM | POA: Diagnosis not present

## 2022-08-25 DIAGNOSIS — E785 Hyperlipidemia, unspecified: Secondary | ICD-10-CM | POA: Diagnosis not present

## 2022-08-25 DIAGNOSIS — J449 Chronic obstructive pulmonary disease, unspecified: Secondary | ICD-10-CM | POA: Diagnosis not present

## 2022-08-25 DIAGNOSIS — E782 Mixed hyperlipidemia: Secondary | ICD-10-CM | POA: Diagnosis not present

## 2022-09-22 DIAGNOSIS — J449 Chronic obstructive pulmonary disease, unspecified: Secondary | ICD-10-CM | POA: Diagnosis not present

## 2022-09-22 DIAGNOSIS — E559 Vitamin D deficiency, unspecified: Secondary | ICD-10-CM | POA: Diagnosis not present

## 2022-09-22 DIAGNOSIS — E119 Type 2 diabetes mellitus without complications: Secondary | ICD-10-CM | POA: Diagnosis not present

## 2022-09-22 DIAGNOSIS — M170 Bilateral primary osteoarthritis of knee: Secondary | ICD-10-CM | POA: Diagnosis not present

## 2022-09-22 DIAGNOSIS — E782 Mixed hyperlipidemia: Secondary | ICD-10-CM | POA: Diagnosis not present

## 2022-09-22 DIAGNOSIS — Z79899 Other long term (current) drug therapy: Secondary | ICD-10-CM | POA: Diagnosis not present

## 2022-09-22 DIAGNOSIS — I10 Essential (primary) hypertension: Secondary | ICD-10-CM | POA: Diagnosis not present

## 2022-10-20 DIAGNOSIS — J449 Chronic obstructive pulmonary disease, unspecified: Secondary | ICD-10-CM | POA: Diagnosis not present

## 2022-10-20 DIAGNOSIS — I10 Essential (primary) hypertension: Secondary | ICD-10-CM | POA: Diagnosis not present

## 2022-10-20 DIAGNOSIS — E782 Mixed hyperlipidemia: Secondary | ICD-10-CM | POA: Diagnosis not present

## 2022-10-20 DIAGNOSIS — Z794 Long term (current) use of insulin: Secondary | ICD-10-CM | POA: Diagnosis not present

## 2022-10-20 DIAGNOSIS — E119 Type 2 diabetes mellitus without complications: Secondary | ICD-10-CM | POA: Diagnosis not present

## 2022-10-26 DIAGNOSIS — I10 Essential (primary) hypertension: Secondary | ICD-10-CM | POA: Diagnosis not present

## 2022-10-26 DIAGNOSIS — E782 Mixed hyperlipidemia: Secondary | ICD-10-CM | POA: Diagnosis not present

## 2022-10-26 DIAGNOSIS — E1165 Type 2 diabetes mellitus with hyperglycemia: Secondary | ICD-10-CM | POA: Diagnosis not present

## 2022-11-11 DIAGNOSIS — E785 Hyperlipidemia, unspecified: Secondary | ICD-10-CM | POA: Diagnosis not present

## 2022-11-11 DIAGNOSIS — E119 Type 2 diabetes mellitus without complications: Secondary | ICD-10-CM | POA: Diagnosis not present

## 2022-11-11 DIAGNOSIS — M6281 Muscle weakness (generalized): Secondary | ICD-10-CM | POA: Diagnosis not present

## 2022-11-11 DIAGNOSIS — M1990 Unspecified osteoarthritis, unspecified site: Secondary | ICD-10-CM | POA: Diagnosis not present

## 2022-11-11 DIAGNOSIS — J45909 Unspecified asthma, uncomplicated: Secondary | ICD-10-CM | POA: Diagnosis not present

## 2022-11-11 DIAGNOSIS — I10 Essential (primary) hypertension: Secondary | ICD-10-CM | POA: Diagnosis not present

## 2022-11-16 DIAGNOSIS — I10 Essential (primary) hypertension: Secondary | ICD-10-CM | POA: Diagnosis not present

## 2022-11-16 DIAGNOSIS — J45909 Unspecified asthma, uncomplicated: Secondary | ICD-10-CM | POA: Diagnosis not present

## 2022-11-16 DIAGNOSIS — Z79899 Other long term (current) drug therapy: Secondary | ICD-10-CM | POA: Diagnosis not present

## 2022-11-17 DIAGNOSIS — E785 Hyperlipidemia, unspecified: Secondary | ICD-10-CM | POA: Diagnosis not present

## 2022-11-17 DIAGNOSIS — I10 Essential (primary) hypertension: Secondary | ICD-10-CM | POA: Diagnosis not present

## 2022-11-17 DIAGNOSIS — Z79899 Other long term (current) drug therapy: Secondary | ICD-10-CM | POA: Diagnosis not present

## 2022-11-17 DIAGNOSIS — Z7189 Other specified counseling: Secondary | ICD-10-CM | POA: Diagnosis not present

## 2022-11-17 DIAGNOSIS — E119 Type 2 diabetes mellitus without complications: Secondary | ICD-10-CM | POA: Diagnosis not present

## 2022-11-19 DIAGNOSIS — Z79899 Other long term (current) drug therapy: Secondary | ICD-10-CM | POA: Diagnosis not present

## 2022-11-19 DIAGNOSIS — M1990 Unspecified osteoarthritis, unspecified site: Secondary | ICD-10-CM | POA: Diagnosis not present

## 2022-11-19 DIAGNOSIS — I10 Essential (primary) hypertension: Secondary | ICD-10-CM | POA: Diagnosis not present

## 2022-12-04 DIAGNOSIS — E782 Mixed hyperlipidemia: Secondary | ICD-10-CM | POA: Diagnosis not present

## 2022-12-04 DIAGNOSIS — Z79899 Other long term (current) drug therapy: Secondary | ICD-10-CM | POA: Diagnosis not present

## 2022-12-04 DIAGNOSIS — E038 Other specified hypothyroidism: Secondary | ICD-10-CM | POA: Diagnosis not present

## 2022-12-04 DIAGNOSIS — D649 Anemia, unspecified: Secondary | ICD-10-CM | POA: Diagnosis not present

## 2022-12-04 DIAGNOSIS — E559 Vitamin D deficiency, unspecified: Secondary | ICD-10-CM | POA: Diagnosis not present

## 2022-12-07 DIAGNOSIS — E860 Dehydration: Secondary | ICD-10-CM | POA: Diagnosis not present

## 2022-12-07 DIAGNOSIS — N289 Disorder of kidney and ureter, unspecified: Secondary | ICD-10-CM | POA: Diagnosis not present

## 2022-12-07 DIAGNOSIS — R197 Diarrhea, unspecified: Secondary | ICD-10-CM | POA: Diagnosis not present

## 2022-12-07 DIAGNOSIS — E441 Mild protein-calorie malnutrition: Secondary | ICD-10-CM | POA: Diagnosis not present

## 2022-12-08 DIAGNOSIS — E860 Dehydration: Secondary | ICD-10-CM | POA: Diagnosis not present

## 2022-12-08 DIAGNOSIS — K580 Irritable bowel syndrome with diarrhea: Secondary | ICD-10-CM | POA: Diagnosis not present

## 2022-12-08 DIAGNOSIS — E441 Mild protein-calorie malnutrition: Secondary | ICD-10-CM | POA: Diagnosis not present

## 2022-12-08 DIAGNOSIS — Z79899 Other long term (current) drug therapy: Secondary | ICD-10-CM | POA: Diagnosis not present

## 2022-12-08 DIAGNOSIS — R197 Diarrhea, unspecified: Secondary | ICD-10-CM | POA: Diagnosis not present

## 2022-12-10 ENCOUNTER — Observation Stay (HOSPITAL_COMMUNITY)
Admission: EM | Admit: 2022-12-10 | Discharge: 2022-12-14 | Disposition: A | Discharge status: Home / Self Care | Payer: Medicare Other | Attending: Internal Medicine | Admitting: Internal Medicine

## 2022-12-10 ENCOUNTER — Other Ambulatory Visit: Payer: Self-pay

## 2022-12-10 ENCOUNTER — Encounter: Payer: Self-pay | Admitting: Internal Medicine

## 2022-12-10 ENCOUNTER — Encounter (HOSPITAL_COMMUNITY): Payer: Self-pay | Admitting: *Deleted

## 2022-12-10 DIAGNOSIS — A09 Infectious gastroenteritis and colitis, unspecified: Secondary | ICD-10-CM | POA: Diagnosis not present

## 2022-12-10 DIAGNOSIS — K922 Gastrointestinal hemorrhage, unspecified: Secondary | ICD-10-CM | POA: Diagnosis present

## 2022-12-10 DIAGNOSIS — Z7982 Long term (current) use of aspirin: Secondary | ICD-10-CM | POA: Insufficient documentation

## 2022-12-10 DIAGNOSIS — N179 Acute kidney failure, unspecified: Secondary | ICD-10-CM | POA: Diagnosis not present

## 2022-12-10 DIAGNOSIS — Z794 Long term (current) use of insulin: Secondary | ICD-10-CM | POA: Insufficient documentation

## 2022-12-10 DIAGNOSIS — R739 Hyperglycemia, unspecified: Secondary | ICD-10-CM | POA: Diagnosis not present

## 2022-12-10 DIAGNOSIS — R7989 Other specified abnormal findings of blood chemistry: Secondary | ICD-10-CM | POA: Diagnosis present

## 2022-12-10 DIAGNOSIS — D649 Anemia, unspecified: Secondary | ICD-10-CM | POA: Diagnosis not present

## 2022-12-10 DIAGNOSIS — R197 Diarrhea, unspecified: Secondary | ICD-10-CM | POA: Diagnosis not present

## 2022-12-10 DIAGNOSIS — I152 Hypertension secondary to endocrine disorders: Secondary | ICD-10-CM | POA: Diagnosis present

## 2022-12-10 DIAGNOSIS — Z79899 Other long term (current) drug therapy: Secondary | ICD-10-CM | POA: Insufficient documentation

## 2022-12-10 DIAGNOSIS — I10 Essential (primary) hypertension: Secondary | ICD-10-CM | POA: Insufficient documentation

## 2022-12-10 DIAGNOSIS — Z87891 Personal history of nicotine dependence: Secondary | ICD-10-CM | POA: Diagnosis not present

## 2022-12-10 DIAGNOSIS — Z7984 Long term (current) use of oral hypoglycemic drugs: Secondary | ICD-10-CM | POA: Insufficient documentation

## 2022-12-10 DIAGNOSIS — A0472 Enterocolitis due to Clostridium difficile, not specified as recurrent: Secondary | ICD-10-CM | POA: Insufficient documentation

## 2022-12-10 DIAGNOSIS — J4520 Mild intermittent asthma, uncomplicated: Secondary | ICD-10-CM | POA: Diagnosis not present

## 2022-12-10 DIAGNOSIS — E6609 Other obesity due to excess calories: Secondary | ICD-10-CM | POA: Diagnosis not present

## 2022-12-10 DIAGNOSIS — E860 Dehydration: Secondary | ICD-10-CM | POA: Diagnosis not present

## 2022-12-10 DIAGNOSIS — Z7901 Long term (current) use of anticoagulants: Secondary | ICD-10-CM | POA: Diagnosis not present

## 2022-12-10 DIAGNOSIS — E1165 Type 2 diabetes mellitus with hyperglycemia: Secondary | ICD-10-CM | POA: Diagnosis not present

## 2022-12-10 DIAGNOSIS — E119 Type 2 diabetes mellitus without complications: Secondary | ICD-10-CM | POA: Diagnosis not present

## 2022-12-10 DIAGNOSIS — I2510 Atherosclerotic heart disease of native coronary artery without angina pectoris: Secondary | ICD-10-CM | POA: Diagnosis not present

## 2022-12-10 DIAGNOSIS — E1159 Type 2 diabetes mellitus with other circulatory complications: Secondary | ICD-10-CM | POA: Diagnosis present

## 2022-12-10 DIAGNOSIS — E785 Hyperlipidemia, unspecified: Secondary | ICD-10-CM | POA: Diagnosis present

## 2022-12-10 DIAGNOSIS — E1169 Type 2 diabetes mellitus with other specified complication: Secondary | ICD-10-CM | POA: Diagnosis present

## 2022-12-10 DIAGNOSIS — Z8659 Personal history of other mental and behavioral disorders: Secondary | ICD-10-CM

## 2022-12-10 DIAGNOSIS — E441 Mild protein-calorie malnutrition: Secondary | ICD-10-CM | POA: Diagnosis not present

## 2022-12-10 DIAGNOSIS — E66812 Obesity, class 2: Secondary | ICD-10-CM

## 2022-12-10 LAB — COMPREHENSIVE METABOLIC PANEL
ALT: 13 U/L (ref 0–44)
AST: 13 U/L — ABNORMAL LOW (ref 15–41)
Albumin: 3.1 g/dL — ABNORMAL LOW (ref 3.5–5.0)
Alkaline Phosphatase: 99 U/L (ref 38–126)
Anion gap: 8 (ref 5–15)
BUN: 63 mg/dL — ABNORMAL HIGH (ref 8–23)
CO2: 16 mmol/L — ABNORMAL LOW (ref 22–32)
Calcium: 9 mg/dL (ref 8.9–10.3)
Chloride: 113 mmol/L — ABNORMAL HIGH (ref 98–111)
Creatinine, Ser: 0.87 mg/dL (ref 0.44–1.00)
GFR, Estimated: >60 mL/min (ref >60)
Glucose, Bld: 347 mg/dL — ABNORMAL HIGH (ref 70–99)
Potassium: 4.3 mmol/L (ref 3.5–5.1)
Sodium: 137 mmol/L (ref 135–145)
Total Bilirubin: 0.5 mg/dL (ref 0.3–1.2)
Total Protein: 6.9 g/dL (ref 6.5–8.1)

## 2022-12-10 LAB — TYPE AND SCREEN
ABO/RH(D): O POS
Antibody Screen: NEGATIVE

## 2022-12-10 LAB — CBC WITH DIFFERENTIAL/PLATELET
Abs Immature Granulocytes: 0.02 10*3/uL (ref 0.00–0.07)
Basophils Absolute: 0 10*3/uL (ref 0.0–0.1)
Basophils Relative: 0 %
Eosinophils Absolute: 0.1 10*3/uL (ref 0.0–0.5)
Eosinophils Relative: 1 %
HCT: 40.1 % (ref 36.0–46.0)
Hemoglobin: 13.3 g/dL (ref 12.0–15.0)
Immature Granulocytes: 0 %
Lymphocytes Relative: 6 %
Lymphs Abs: 0.6 10*3/uL — ABNORMAL LOW (ref 0.7–4.0)
MCH: 28.3 pg (ref 26.0–34.0)
MCHC: 33.2 g/dL (ref 30.0–36.0)
MCV: 85.3 fL (ref 80.0–100.0)
Monocytes Absolute: 0.7 10*3/uL (ref 0.1–1.0)
Monocytes Relative: 7 %
Neutro Abs: 8.4 10*3/uL — ABNORMAL HIGH (ref 1.7–7.7)
Neutrophils Relative %: 86 %
Platelets: 251 10*3/uL (ref 150–400)
RBC: 4.7 MIL/uL (ref 3.87–5.11)
RDW: 15 % (ref 11.5–15.5)
WBC: 9.7 10*3/uL (ref 4.0–10.5)
nRBC: 0 % (ref 0.0–0.2)

## 2022-12-10 LAB — POC OCCULT BLOOD, ED: Fecal Occult Bld: POSITIVE — AB

## 2022-12-10 LAB — GLUCOSE, CAPILLARY: Glucose-Capillary: 347 mg/dL — ABNORMAL HIGH (ref 70–99)

## 2022-12-10 MED ORDER — CLOPIDOGREL BISULFATE 75 MG PO TABS
75.0000 mg | ORAL_TABLET | Freq: Every day | ORAL | Status: DC
  Administered 2022-12-11 – 2022-12-14 (×4): 75 mg via ORAL
  Filled 2022-12-10 (×4): qty 1

## 2022-12-10 MED ORDER — VANCOMYCIN HCL 125 MG PO CAPS
125.0000 mg | ORAL_CAPSULE | Freq: Four times a day (QID) | ORAL | Status: DC
  Filled 2022-12-10 (×3): qty 1

## 2022-12-10 MED ORDER — HYDRALAZINE HCL 20 MG/ML IJ SOLN: 5.0000 mg | INTRAMUSCULAR | Status: DC | PRN

## 2022-12-10 MED ORDER — PANTOPRAZOLE SODIUM 40 MG IV SOLR
40.0000 mg | Freq: Two times a day (BID) | INTRAVENOUS | Status: DC
  Administered 2022-12-10 – 2022-12-11 (×3): 40 mg via INTRAVENOUS
  Filled 2022-12-10 (×4): qty 10

## 2022-12-10 MED ORDER — PANTOPRAZOLE INFUSION (NEW) - SIMPLE MED
8.0000 mg/h | INTRAVENOUS | Status: DC
  Administered 2022-12-10: 8 mg/h via INTRAVENOUS
  Filled 2022-12-10: qty 100

## 2022-12-10 MED ORDER — INSULIN GLARGINE-YFGN 100 UNIT/ML ~~LOC~~ SOLN
10.0000 [IU] | Freq: Two times a day (BID) | SUBCUTANEOUS | Status: DC
  Administered 2022-12-10 – 2022-12-14 (×8): 10 [IU] via SUBCUTANEOUS
  Filled 2022-12-10 (×11): qty 0.1

## 2022-12-10 MED ORDER — INSULIN ASPART 100 UNIT/ML IJ SOLN
0.0000 [IU] | Freq: Three times a day (TID) | INTRAMUSCULAR | Status: DC
  Administered 2022-12-11 (×2): 5 [IU] via SUBCUTANEOUS
  Administered 2022-12-11: 11 [IU] via SUBCUTANEOUS
  Administered 2022-12-12: 5 [IU] via SUBCUTANEOUS
  Administered 2022-12-12: 8 [IU] via SUBCUTANEOUS
  Administered 2022-12-12: 11 [IU] via SUBCUTANEOUS
  Administered 2022-12-13 – 2022-12-14 (×4): 8 [IU] via SUBCUTANEOUS
  Administered 2022-12-14: 11 [IU] via SUBCUTANEOUS

## 2022-12-10 MED ORDER — CARVEDILOL 12.5 MG PO TABS
12.5000 mg | ORAL_TABLET | Freq: Two times a day (BID) | ORAL | Status: DC
  Administered 2022-12-11 – 2022-12-14 (×7): 12.5 mg via ORAL
  Filled 2022-12-10 (×7): qty 1

## 2022-12-10 MED ORDER — PANTOPRAZOLE 80MG IVPB - SIMPLE MED
80.0000 mg | Freq: Once | INTRAVENOUS | Status: AC
  Administered 2022-12-10: 80 mg via INTRAVENOUS
  Filled 2022-12-10: qty 100

## 2022-12-10 MED ORDER — ATORVASTATIN CALCIUM 40 MG PO TABS
40.0000 mg | ORAL_TABLET | Freq: Every day | ORAL | Status: DC
  Administered 2022-12-10 – 2022-12-14 (×5): 40 mg via ORAL
  Filled 2022-12-10 (×5): qty 1

## 2022-12-10 MED ORDER — LISINOPRIL 10 MG PO TABS
40.0000 mg | ORAL_TABLET | Freq: Every day | ORAL | Status: DC
  Administered 2022-12-10 – 2022-12-14 (×5): 40 mg via ORAL
  Filled 2022-12-10 (×5): qty 4

## 2022-12-10 MED ORDER — MONTELUKAST SODIUM 10 MG PO TABS
10.0000 mg | ORAL_TABLET | Freq: Every day | ORAL | Status: DC
  Administered 2022-12-10 – 2022-12-13 (×4): 10 mg via ORAL
  Filled 2022-12-10 (×4): qty 1

## 2022-12-10 NOTE — ED Triage Notes (Signed)
Pt BIB [facility] from [facility] with c/o abnormal labs; facility reported to ems that pt had a BUN or 88 and cbg of 367; pt is not oriented and came to ED with a depend saturated with urine and stool

## 2022-12-10 NOTE — ED Provider Notes (Signed)
[place] EMERGENCY DEPARTMENT AT [facility] Provider Note   CSN: [id number] Arrival date & time: 12/10/22  1545     History  Chief Complaint  Patient presents with   Abnormal Lab    Jamie Strickland is a 77 y.o. female.  77 year old female with a history of CAD status post PCI on Plavix, hypertension, hyperlipidemia, and type 2 diabetes who presents to the emergency department with elevated BUN and Hemoccult positive stool.  Patient brought in from [facility] where she had abnormal lab work that showed that her BUN was elevated with a normal creatinine.  Had 2 positive Hemoccult stools.  Patient denies any bloody or black stools.  Says she is not having any abdominal pain or nausea or vomiting recently.  History is limited due to dementia.  Per son, has been having nausea and diarrhea for a week. No fevers or abdominal pain. No bloody stools. Unsure of quantity of diarrhea. Does have dementia.        Home Medications Prior to Admission medications   Medication Sig Start Date End Date Taking? Authorizing Provider  albuterol (PROAIR HFA) 108 (90 Base) MCG/ACT inhaler INHALE 1-2 PUFFS INTO THE LUNGS EVERY 4 (FOUR) HOURS AS NEEDED FOR WHEEZING OR SHORTNESS OF BREATH 06/07/19  Yes Breeback, Jade L, PA-C  albuterol (PROVENTIL) (2.5 MG/3ML) 0.083% nebulizer solution USE 1 VIAL VIA NEBULIZER EVERY 4 HOURS AS NEEDED FOR WHEEZING/SHORTNESS OF BREATH 09/01/21  Yes Breeback, Jade L, PA-C  amLODipine (NORVASC) 10 MG tablet TAKE 1 TABLET BY MOUTH EVERY DAY 03/28/21  Yes Breeback, Jade L, PA-C  aspirin EC 81 MG tablet Take 81 mg by mouth daily.   Yes [provider]  atorvastatin (LIPITOR) 20 MG tablet TAKE 1 TABLET BY MOUTH EVERY DAY/ needs labs Patient taking differently: Take 80 mg by mouth daily. 01/30/21  Yes Breeback, Jade L, PA-C  Cholecalciferol 100 MCG (4000 UT) TABS Take 1 tablet by mouth daily.   Yes [provider]  clopidogrel (PLAVIX) 75 MG tablet Take 1  tablet (75 mg total) by mouth daily. 01/29/20  Yes Breeback, Jade L, PA-C  empagliflozin (JARDIANCE) 10 MG TABS tablet Take 10 mg by mouth daily.   Yes [provider]  fluticasone-salmeterol (ADVAIR DISKUS) 250-50 MCG/ACT AEPB Inhale 1 puff into the lungs in the morning and at bedtime.   Yes [provider]  hydrALAZINE (APRESOLINE) 50 MG tablet TAKE 1 TABLET BY MOUTH THREE TIMES A DAY 08/28/21  Yes Breeback, Jade L, PA-C  insulin lispro (HUMALOG) 100 UNIT/ML injection INJECT 25 UNITS INTO THE SKIN 3 TIMES DAILY BEFORE MEALS Patient taking differently: 15 Units 3 (three) times daily with meals. INJECT 15 UNITS INTO THE SKIN 3 TIMES DAILY BEFORE MEALS 12/11/21  Yes Breeback, Jade L, PA-C  LANTUS SOLOSTAR 100 UNIT/ML Solostar Pen Inject 30 Units into the skin 2 (two) times daily.   Yes [provider]  lisinopril-hydrochlorothiazide (ZESTORETIC) 20-12.5 MG tablet Take 1 tablet by mouth 2 (two) times daily.   Yes [provider]  metFORMIN (GLUCOPHAGE-XR) 500 MG 24 hr tablet TAKE 2 TABLETS (1,000 MG TOTAL) BY MOUTH 2 (TWO) TIMES DAILY WITH A MEAL. 05/06/21  Yes Breeback, Jade L, PA-C  metoprolol succinate (TOPROL-XL) 100 MG 24 hr tablet Take 100 mg by mouth daily.   Yes [provider]  nitroGLYCERIN (NITROSTAT) 0.4 MG SL tablet Place 0.4 mg under the tongue every 5 (five) minutes as needed for chest pain.   Yes [provider]  ondansetron (ZOFRAN-ODT) 4 MG disintegrating tablet Take 4 mg by mouth every 4 (four) hours as needed for nausea or vomiting.   Yes [provider]  pantoprazole (PROTONIX) 40 MG tablet Take 40 mg by mouth daily.   Yes [provider]  spironolactone (ALDACTONE) 50 MG tablet Take 50 mg by mouth daily.   Yes [provider]  sucralfate (CARAFATE) 1 GM/10ML suspension Take 1 g by mouth 3 (three) times daily. 12/04/22  Yes [provider]  Monte Fantasia INHUB 250-50 MCG/ACT AEPB INHALE 1 PUFF BY MOUTH  TWICE A DAY 01/30/21  Yes Breeback, Jade L, PA-C  ACCU-CHEK GUIDE test strip TEST UP TO 4 TIMES DAILY AS DIRECTED 12/08/21   Breeback, Jade L, PA-C  ATROVENT HFA 17 MCG/ACT inhaler INHALE 2-3 PUFFS INTO THE LUNGS EVERY 6 HOURS AS NEEDED FOR WHEEZING. Patient not taking: Reported on 12/11/2022 12/11/21   Jomarie Longs, PA-C  BD INSULIN SYRINGE U/F 31G X 5/16" 0.5 ML MISC USE AS DIRECTED 08/28/21   Breeback, Jade L, PA-C  BD PEN NEEDLE NANO 2ND GEN 32G X 4 MM MISC FOR WEEKLY INJECTION WITH OZEMPIC 12/16/21   Breeback, Jade L, PA-C  blood glucose meter kit and supplies Dispense based on patient and insurance preference. Use up to four times daily as directed. (FOR ICD-10 E10.9, E11.9). 03/06/20   Breeback, Lonna Cobb, PA-C  carvedilol (COREG) 12.5 MG tablet Take 1 tablet (12.5 mg total) by mouth 2 (two) times daily with a meal. Patient not taking: Reported on 12/11/2022 01/29/20   Jomarie Longs, PA-C  cephALEXin (KEFLEX) 500 MG capsule Take 1 capsule (500 mg total) by mouth 4 (four) times daily. Patient not taking: Reported on 12/11/2022 06/29/22   Ernie Avena, MD  chlorthalidone (HYGROTON) 25 MG tablet TAKE 1 TABLET (25 MG TOTAL) BY MOUTH DAILY. Patient not taking: Reported on 12/11/2022 05/06/21   Tandy Gaw L, PA-C  fluticasone (FLONASE) 50 MCG/ACT nasal spray SPRAY 2 SPRAYS INTO EACH NOSTRIL EVERY DAY Patient not taking: Reported on 12/11/2022 05/19/21   Tandy Gaw L, PA-C  glipiZIDE (GLUCOTROL) 10 MG tablet TAKE 1 TABLET (10 MG TOTAL) BY MOUTH 2 (TWO) TIMES DAILY BEFORE A MEAL. Patient not taking: Reported on 12/11/2022 01/30/21   Tandy Gaw L, PA-C  insulin degludec (TRESIBA FLEXTOUCH) 200 UNIT/ML FlexTouch Pen INJECT 40 UNITS IN MORNING & AT BEDTIME-TITRATE BY 2UNITS EVERY 3 DAYS UNTIL FASTING GLUCOSE 90-130/ NEEDS APPT Patient not taking: Reported on 12/11/2022 01/15/22   Tandy Gaw L, PA-C  insulin lispro (HUMALOG KWIKPEN) 200 UNIT/ML KwikPen Inject 25-50 Units into the skin 3 (three) times daily  before meals. Needs appt Patient not taking: Reported on 12/11/2022 01/16/22   Tandy Gaw L, PA-C  levocetirizine (XYZAL) 5 MG tablet TAKE 1 TABLET BY MOUTH EVERY DAY IN THE EVENING Patient not taking: Reported on 12/11/2022 01/30/21   Jomarie Longs, PA-C  lisinopril (ZESTRIL) 40 MG tablet Take 1 tablet (40 mg total) by mouth daily. Needs appointment. Patient not taking: Reported on 12/11/2022 06/08/22   Tandy Gaw L, PA-C  montelukast (SINGULAIR) 10 MG tablet TAKE 1 TABLET BY MOUTH EVERYDAY AT BEDTIME Patient not taking: Reported on 12/11/2022 01/30/21   Jomarie Longs, PA-C      Allergies    Invokana [canagliflozin], Trulicity [dulaglutide], and Codeine    Review of Systems   Review of Systems  Physical Exam Updated Vital Signs BP (!) 126/51 (BP Location: Right Arm)   Pulse 67  Temp 97.6 F (36.4 C) (Axillary)   Resp 18   Ht 5\' 3"  (1.6 m)   Wt 99.8 kg   SpO2 100%   BMI 38.97 kg/m  Physical Exam Vitals and nursing note reviewed.  Constitutional:      General: She is not in acute distress.    Appearance: She is well-developed.  HENT:     Head: Normocephalic and atraumatic.     Right Ear: External ear normal.     Left Ear: External ear normal.     Nose: Nose normal.  Eyes:     Extraocular Movements: Extraocular movements intact.     Conjunctiva/sclera: Conjunctivae normal.     Pupils: Pupils are equal, round, and reactive to light.  Pulmonary:     Effort: Pulmonary effort is normal.  Abdominal:     General: Abdomen is flat. There is no distension.     Palpations: Abdomen is soft. There is no mass.     Tenderness: There is no abdominal tenderness. There is no guarding.  Genitourinary:    Comments: Watery brown stool that is Hemoccult positive.  Does have some erythema around her buttocks. Musculoskeletal:     Cervical back: Normal range of motion and neck supple.  Skin:    General: Skin is warm and dry.  Neurological:     Mental Status: She is alert. Mental  status is at baseline.     Comments: Patient refusing to her alert and oriented questions at this time.  Says "I know who I am why do I need to tell you?"  Psychiatric:        Mood and Affect: Mood normal.     ED Results / Procedures / Treatments   Labs (all labs ordered are listed, but only abnormal results are displayed) Labs Reviewed  COMPREHENSIVE METABOLIC PANEL - Abnormal; Notable for the following components:      Result Value   Chloride 113 (*)    CO2 16 (*)    Glucose, Bld 347 (*)    BUN 63 (*)    Albumin 3.1 (*)    AST 13 (*)    All other components within normal limits  CBC WITH DIFFERENTIAL/PLATELET - Abnormal; Notable for the following components:   Neutro Abs 8.4 (*)    Lymphs Abs 0.6 (*)    All other components within normal limits  BASIC METABOLIC PANEL - Abnormal; Notable for the following components:   Chloride 114 (*)    CO2 19 (*)    Glucose, Bld 338 (*)    BUN 48 (*)    All other components within normal limits  CBC - Abnormal; Notable for the following components:   WBC 11.9 (*)    All other components within normal limits  HEMOGLOBIN A1C - Abnormal; Notable for the following components:   Hgb A1c MFr Bld 10.0 (*)    All other components within normal limits  GLUCOSE, CAPILLARY - Abnormal; Notable for the following components:   Glucose-Capillary 347 (*)    All other components within normal limits  GLUCOSE, CAPILLARY - Abnormal; Notable for the following components:   Glucose-Capillary 283 (*)    All other components within normal limits  GLUCOSE, CAPILLARY - Abnormal; Notable for the following components:   Glucose-Capillary 323 (*)    All other components within normal limits  GLUCOSE, CAPILLARY - Abnormal; Notable for the following components:   Glucose-Capillary 227 (*)    All other components within normal limits  POC OCCULT BLOOD,  ED - Abnormal; Notable for the following components:   Fecal Occult Bld POSITIVE (*)    All other components  within normal limits  C DIFFICILE QUICK SCREEN W PCR REFLEX    GASTROINTESTINAL PANEL BY PCR, STOOL (REPLACES STOOL CULTURE)  CBC WITH DIFFERENTIAL/PLATELET  TYPE AND SCREEN    EKG EKG Interpretation Date/Time:  Thursday December 10 2022 16:22:05 EDT Ventricular Rate:  77 PR Interval:  153 QRS Duration:  103 QT Interval:  397 QTC Calculation: 450 R Axis:   24  Text Interpretation: Sinus rhythm Borderline T wave abnormalities Confirmed by Vonita Moss [phone]) on 12/10/2022 4:56:01 PM  Radiology No results found.  Procedures Procedures    Medications Ordered in ED Medications  pantoprazole (PROTONIX) injection 40 mg (40 mg Intravenous Given 12/11/22 0829)  carvedilol (COREG) tablet 12.5 mg (12.5 mg Oral Given 12/11/22 0826)  lisinopril (ZESTRIL) tablet 40 mg (40 mg Oral Given 12/11/22 0826)  atorvastatin (LIPITOR) tablet 40 mg (40 mg Oral Given 12/11/22 0826)  clopidogrel (PLAVIX) tablet 75 mg (75 mg Oral Given 12/11/22 0827)  montelukast (SINGULAIR) tablet 10 mg (10 mg Oral Given 12/10/22 2114)  hydrALAZINE (APRESOLINE) injection 5 mg (has no administration in time range)  insulin aspart (novoLOG) injection 0-15 Units (5 Units Subcutaneous Given 12/11/22 1141)  insulin glargine-yfgn (SEMGLEE) injection 10 Units (10 Units Subcutaneous Given 12/11/22 1141)  Oral care mouth rinse (has no administration in time range)  liver oil-zinc oxide (DESITIN) 40 % ointment (has no administration in time range)  pantoprazole (PROTONIX) 80 mg /NS 100 mL IVPB (0 mg Intravenous Stopped 12/10/22 1731)    ED Course/ Medical Decision Making/ A&P Clinical Course as of 12/11/22 1449  Thu Dec 10, 2022  1607 Attempted to contact patient's son without success. [RP]  1609 Attempted to contact [facility] without success. [RP]  1832 Dr Tasia Catchings from [facility] consulted. Protonix iv bid and npo at midnight.  [RP]  1913 Dr Florene Route from hospitalist admit the patient.  Was able to get a hold of the patient's son eventually.  [RP]    Clinical Course User Index [RP] Rondel Baton, MD                                 Medical Decision Making Amount and/or Complexity of Data Reviewed Labs: ordered.  Risk Decision regarding hospitalization.   Jamie Strickland is a 77 y.o. female with comorbidities that complicate the patient evaluation including CAD status post PCI on Plavix, hypertension, hyperlipidemia, dementia, and type 2 diabetes who presents to the emergency department with elevated BUN and Hemoccult positive stool in the setting of diarrhea  Initial Ddx:  GI bleed, AKI, electrolyte abnormalities, colitis, C. difficile, infectious diarrhea  MDM/Course:  Patient presents to the emergency department with several days of loose stool and abnormal lab work.  Does not have any abdominal pain.  Did have brown stool that was fecal occult positive.  Labs did show an elevated BUN with a normal creatinine.  Given degree of elevation feel that it likely is due to GI bleed rather than AKI so is expect her creatinine to be elevated with a BUN of 63.  Hemoglobin was WNL.  She remained hemodynamically stable.  Was discussed with GI who recommended continuing Protonix and making the patient n.p.o. at midnight.  Was admitted to medicine for further management.  Of note, with her frequent diarrhea and the fact that she lives at  a facility did place C. difficile test but are still awaiting results at this time.  Was placed on contact precautions.  This patient presents to the ED for concern of complaints listed in HPI, this involves an extensive number of treatment options, and is a complaint that carries with it a high risk of complications and morbidity. Disposition including potential need for admission considered.   Dispo: Admit to Floor  Additional history obtained from son Records reviewed Outpatient Clinic Notes The following labs were independently interpreted: Chemistry and show  elevated BUN with normal creatinine  concerning for possible GI bleed I personally reviewed and interpreted cardiac monitoring: normal sinus rhythm  I personally reviewed and interpreted the pt's EKG: see above for interpretation  I have reviewed the patients home medications and made adjustments as needed Consults: Gastroenterology Social Determinants of health:  Elderly, dementia, SNF patient         Final Clinical Impression(s) / ED Diagnoses Final diagnoses:  Diarrhea of presumed infectious origin  Gastrointestinal hemorrhage, unspecified gastrointestinal hemorrhage type  History of dementia    Rx / DC Orders ED Discharge Orders     None         Rondel Baton, MD 12/11/22 [phone]

## 2022-12-10 NOTE — H&P (Signed)
TRH H&P   Patient Demographics:    Jamie Strickland, is a 77 y.o. female  MRN: [id number]   DOB - 09-25-1945  Admit Date - 12/10/2022  Outpatient Primary MD for the patient is Dennard Nip, NP  Referring MD/NP/PA: Dr. Jarold Motto  Outpatient Specialists: Cardiology with [facility]  Patient coming from: SNF  Chief Complaint  Patient presents with   Abnormal Lab      HPI:    Jamie Strickland  is a 77 y.o. female, with past medical history of type 2 diabetes, hypertension, hyperlipidemia.  3 of CAD, with stent placement 02/17/2022. -Sent was sent to ED by SNF facility from [facility], for abnormal labs, that showed her BUN is elevated, with normal creatinine, as well she had 2 positive Hemoccult stools, so she was sent to ED for further evaluation, there is no bloody or black stools noted, but she has been having diarrhea for few days, no abdominal pain, no nausea, no vomiting, she is very poor historian due to dementia, history was obtained from facility and ED staff. -In ED her hemoglobin was stable at 13.3, she was Hemoccult positive, she had an episode of diarrhea while in ED, bowel movement, foul-smelling, but no melena noted, but Hemoccult positive, ED discussed with GI on-call, who recommended IV Protonix, n.p.o. after midnight.    Review of systems:    Patient unable to provide any reliable review of system  With Past History of the following :    Past Medical History:  Diagnosis Date   Allergic rhinitis    Diabetes mellitus type II    Hx of colonic polyps    Hyperlipidemia    Hypertension    Mild atherosclerosis of carotid artery, right 11/22/2018   Mild intermittent allergic asthma without complication 12/28/2007   Qualifier: Diagnosis of  By: Alphonsus Sias MD, Ronnette Hila    Mild mitral regurgitation 08/10/2017   Mild nonproliferative diabetic retinopathy (HCC) 09/25/2014    Dxed Dr Blair Promise, 09/11/2014.   Changes noted in bilateral eyes?    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   CATARACT EXTRACTION W/ INTRAOCULAR LENS  IMPLANT, BILATERAL Bilateral 2014   CATARACT EXTRACTION W/ INTRAOCULAR LENS  IMPLANT, BILATERAL  11/14   CHOLECYSTECTOMY     LASIK        Social History:     Social History   Tobacco Use   Smoking status: Former    Current packs/day: 0.50    Average packs/day: 0.5 packs/day for 50.0 years (25.0 ttl pk-yrs)    Types: Cigarettes   Smokeless tobacco: Former    Quit date: 08/09/2008  Substance Use Topics   Alcohol use: Yes    Alcohol/week: 0.0 standard drinks of alcohol    Comment: Rare  Family History :     Family History  Problem Relation Age of Onset   Diabetes Mother    Heart disease Mother    Diabetes Father        brittle diabetes   Asthma Father 35   Liver disease Father        liver problems   Heart disease Father    Heart disease Brother        CAD   Cancer Maternal Aunt        Breast   Heart disease Maternal Grandfather        CAD   Cancer Brother        lung     Home Medications:   Prior to Admission medications   Medication Sig Start Date End Date Taking? Authorizing Provider  ACCU-CHEK GUIDE test strip TEST UP TO 4 TIMES DAILY AS DIRECTED 12/08/21   Breeback, Jade L, PA-C  albuterol (PROAIR HFA) 108 (90 Base) MCG/ACT inhaler INHALE 1-2 PUFFS INTO THE LUNGS EVERY 4 (FOUR) HOURS AS NEEDED FOR WHEEZING OR SHORTNESS OF BREATH 06/07/19   Breeback, Jade L, PA-C  albuterol (PROVENTIL) (2.5 MG/3ML) 0.083% nebulizer solution USE 1 VIAL VIA NEBULIZER EVERY 4 HOURS AS NEEDED FOR WHEEZING/SHORTNESS OF BREATH 09/01/21   Breeback, Jade L, PA-C  amLODipine (NORVASC) 10 MG tablet TAKE 1 TABLET BY MOUTH EVERY DAY 03/28/21   Breeback, Jade L, PA-C  aspirin EC 81 MG tablet Take 81 mg by mouth  daily. Patient not taking: Reported on 07/28/2021    [provider]  atorvastatin (LIPITOR) 20 MG tablet TAKE 1 TABLET BY MOUTH EVERY DAY/ needs labs 01/30/21   Tandy Gaw L, PA-C  ATROVENT HFA 17 MCG/ACT inhaler INHALE 2-3 PUFFS INTO THE LUNGS EVERY 6 HOURS AS NEEDED FOR WHEEZING. 12/11/21   Breeback, Jade L, PA-C  BD INSULIN SYRINGE U/F 31G X 5/16" 0.5 ML MISC USE AS DIRECTED 08/28/21   Breeback, Jade L, PA-C  BD PEN NEEDLE NANO 2ND GEN 32G X 4 MM MISC FOR WEEKLY INJECTION WITH OZEMPIC 12/16/21   Breeback, Jade L, PA-C  blood glucose meter kit and supplies Dispense based on patient and insurance preference. Use up to four times daily as directed. (FOR ICD-10 E10.9, E11.9). 03/06/20   Breeback, Lonna Cobb, PA-C  carvedilol (COREG) 12.5 MG tablet Take 1 tablet (12.5 mg total) by mouth 2 (two) times daily with a meal. 01/29/20   Breeback, Jade L, PA-C  cephALEXin (KEFLEX) 500 MG capsule Take 1 capsule (500 mg total) by mouth 4 (four) times daily. 06/29/22   Ernie Avena, MD  chlorthalidone (HYGROTON) 25 MG tablet TAKE 1 TABLET (25 MG TOTAL) BY MOUTH DAILY. 05/06/21   Jomarie Longs, PA-C  clopidogrel (PLAVIX) 75 MG tablet Take 1 tablet (75 mg total) by mouth daily. 01/29/20   Breeback, Jade L, PA-C  fluticasone (FLONASE) 50 MCG/ACT nasal spray SPRAY 2 SPRAYS INTO EACH NOSTRIL EVERY DAY 05/19/21   Breeback, Jade L, PA-C  glipiZIDE (GLUCOTROL) 10 MG tablet TAKE 1 TABLET (10 MG TOTAL) BY MOUTH 2 (TWO) TIMES DAILY BEFORE A MEAL. 01/30/21   Breeback, Jade L, PA-C  hydrALAZINE (APRESOLINE) 50 MG tablet TAKE 1 TABLET BY MOUTH THREE TIMES A DAY 08/28/21   Breeback, Jade L, PA-C  insulin degludec (TRESIBA FLEXTOUCH) 200 UNIT/ML FlexTouch Pen INJECT 40 UNITS IN MORNING & AT BEDTIME-TITRATE BY 2UNITS EVERY 3 DAYS UNTIL FASTING GLUCOSE 90-130/ NEEDS APPT 01/15/22   Tandy Gaw L, PA-C  insulin lispro (HUMALOG  KWIKPEN) 200 UNIT/ML KwikPen Inject 25-50 Units into the skin 3 (three) times daily before meals. Needs  appt 01/16/22   Tandy Gaw L, PA-C  insulin lispro (HUMALOG) 100 UNIT/ML injection INJECT 25 UNITS INTO THE SKIN 3 TIMES DAILY BEFORE MEALS 12/11/21   Breeback, Jade L, PA-C  levocetirizine (XYZAL) 5 MG tablet TAKE 1 TABLET BY MOUTH EVERY DAY IN THE EVENING 01/30/21   Breeback, Jade L, PA-C  lisinopril (ZESTRIL) 40 MG tablet Take 1 tablet (40 mg total) by mouth daily. Needs appointment. 06/08/22   Breeback, Jade L, PA-C  metFORMIN (GLUCOPHAGE-XR) 500 MG 24 hr tablet TAKE 2 TABLETS (1,000 MG TOTAL) BY MOUTH 2 (TWO) TIMES DAILY WITH A MEAL. 05/06/21   Breeback, Jade L, PA-C  montelukast (SINGULAIR) 10 MG tablet TAKE 1 TABLET BY MOUTH EVERYDAY AT BEDTIME 01/30/21   Breeback, Jade L, PA-C  WIXELA INHUB 250-50 MCG/ACT AEPB INHALE 1 PUFF BY MOUTH TWICE A DAY 01/30/21   Breeback, Jade L, PA-C     Allergies:     Allergies  Allergen Reactions   Invokana [Canagliflozin] Nausea And Vomiting and Other (See Comments)    Yeast infections   Trulicity [Dulaglutide]     nausea   Codeine Nausea And Vomiting    REACTION: n /v     Physical Exam:   Vitals  Blood pressure (!) 154/66, pulse 90, temperature 98 F (36.7 C), temperature source Oral, resp. rate 18, height 5\' 3"  (1.6 m), weight 99.8 kg, SpO2 98%.   1. General elderly female, laying in bed, no apparent distress  2.  Impaired cognition and insight  3. No F.N deficits, ALL C.Nerves Intact, Strength 5/5 all 4 extremities, Sensation intact all 4 extremities, Plantars down going.  4. Ears and Eyes appear Normal, Conjunctivae clear, PERRLA. Moist Oral Mucosa.  5. Supple Neck, No JVD, No cervical lymphadenopathy appriciated, No Carotid Bruits.  6. Symmetrical Chest wall movement, Good air movement bilaterally, CTAB.  7. RRR, No Gallops, Rubs or Murmurs, No Parasternal Heave.  8. Positive Bowel Sounds, Abdomen Soft, No tenderness, No organomegaly appriciated,No rebound -guarding or rigidity.  9.  No Cyanosis, Normal Skin Turgor, No Skin Rash or  Bruise.  10. Good muscle tone,  joints appear normal , no effusions, Normal ROM.     Data Review:    CBC Recent Labs  Lab 12/10/22 1712  WBC 9.7  HGB 13.3  HCT 40.1  PLT 251  MCV 85.3  MCH 28.3  MCHC 33.2  RDW 15.0  LYMPHSABS 0.6*  MONOABS 0.7  EOSABS 0.1  BASOSABS 0.0   ------------------------------------------------------------------------------------------------------------------  Chemistries  Recent Labs  Lab 12/10/22 1638  NA 137  K 4.3  CL 113*  CO2 16*  GLUCOSE 347*  BUN 63*  CREATININE 0.87  CALCIUM 9.0  AST 13*  ALT 13  ALKPHOS 99  BILITOT 0.5   ------------------------------------------------------------------------------------------------------------------ estimated creatinine clearance is 63 mL/min (by C-G formula based on SCr of 0.87 mg/dL). ------------------------------------------------------------------------------------------------------------------ No results for input(s): "TSH", "T4TOTAL", "T3FREE", "THYROIDAB" in the last 72 hours.  Invalid input(s): "FREET3"  Coagulation profile No results for input(s): "INR", "PROTIME" in the last 168 hours. ------------------------------------------------------------------------------------------------------------------- No results for input(s): "DDIMER" in the last 72 hours. -------------------------------------------------------------------------------------------------------------------  Cardiac Enzymes No results for input(s): "CKMB", "TROPONINI", "MYOGLOBIN" in the last 168 hours.  Invalid input(s): "CK" ------------------------------------------------------------------------------------------------------------------    Component Value Date/Time   BNP CANCELED 07/05/2019 1418     ---------------------------------------------------------------------------------------------------------------  Urinalysis    Component Value Date/Time   COLORURINE COLORLESS (  A) 06/28/2022 1647    APPEARANCEUR CLEAR 06/28/2022 1647   LABSPEC 1.021 06/28/2022 1647   PHURINE 5.0 06/28/2022 1647   GLUCOSEU >=500 (A) 06/28/2022 1647   HGBUR NEGATIVE 06/28/2022 1647   BILIRUBINUR NEGATIVE 06/28/2022 1647   KETONESUR NEGATIVE 06/28/2022 1647   PROTEINUR NEGATIVE 06/28/2022 1647   NITRITE NEGATIVE 06/28/2022 1647   LEUKOCYTESUR SMALL (A) 06/28/2022 1647    ----------------------------------------------------------------------------------------------------------------   Imaging Results:    No results found.   EKG:  Vent. rate 77 BPM PR interval 153 ms QRS duration 103 ms QT/QTcB 397/450 ms P-R-T axes 30 24 41 Sinus rhythm Borderline T wave abnormalities    Assessment & Plan:    Principal Problem:   GI bleed Active Problems:   Class 2 severe obesity due to excess calories with serious comorbidity and body mass index (BMI) of 39.0 to 39.9 in adult [facility])   Hypertension associated with type 2 diabetes mellitus (HCC)   Hyperlipidemia associated with type 2 diabetes mellitus (HCC)   CAD S/P percutaneous coronary angioplasty    Hemoccult positive stool GI bleed  Elevated BUN -Patient presents with elevated BUN, Hemoccult positive stool, as well she is on Plavix, hemoglobin is stable. -ED discussed with GI, will admit and keep n.p.o. after night pending official consult -Monitor CBC closely. -With diarrhea, concern for factious etiology causing her, possible Hemoccult positive stool, but abdomen exam is benign, so we will hold on imaging for now, will check C. difficile and GI panel by PCR -Continue with PPI for now -I will keep her on Plavix given she has stent November, and no significant evidence of GI bleed, her hemoglobin is stable  Diabetes mellitus type II -Awaiting medication reconciliation, and will resume on Lantus lower dose, and will keep an insulin sliding scale, will check A1c  Hypertension -Pressures elevated, continue with home  medications  Hyperlipidemia -Continue with home medications  CAD - S/P drug eluting coronary stent placement 02/17/2022  DES to OM1 of LCx Feb 16 2022  -Continue with beta-blockers, statin and Plavix  DVT Prophylaxis  SCDs   AM Labs Ordered, also please review Full Orders  Family Communication: Admission, patients condition and plan of care including tests being ordered have been discussed with the patient and( tried to reach son and able to leave a voicemail) who indicate understanding and agree with the plan and Code Status.  Code Status DNR, form present with the patient from SNF  Likely DC to  back to SNF  Condition GUARDED    Consults called: GI by ED    Admission status: observation    Time spent in minutes : 70 minutes   Huey Bienenstock M.D on 12/10/2022 at 8:53 PM   [facility] - Office  [phone]

## 2022-12-11 DIAGNOSIS — R197 Diarrhea, unspecified: Secondary | ICD-10-CM

## 2022-12-11 DIAGNOSIS — Z9861 Coronary angioplasty status: Secondary | ICD-10-CM | POA: Diagnosis not present

## 2022-12-11 DIAGNOSIS — Z6839 Body mass index (BMI) 39.0-39.9, adult: Secondary | ICD-10-CM

## 2022-12-11 DIAGNOSIS — I152 Hypertension secondary to endocrine disorders: Secondary | ICD-10-CM | POA: Diagnosis not present

## 2022-12-11 DIAGNOSIS — E1159 Type 2 diabetes mellitus with other circulatory complications: Secondary | ICD-10-CM | POA: Diagnosis not present

## 2022-12-11 DIAGNOSIS — E1169 Type 2 diabetes mellitus with other specified complication: Secondary | ICD-10-CM | POA: Diagnosis not present

## 2022-12-11 DIAGNOSIS — E785 Hyperlipidemia, unspecified: Secondary | ICD-10-CM | POA: Diagnosis not present

## 2022-12-11 DIAGNOSIS — R195 Other fecal abnormalities: Secondary | ICD-10-CM

## 2022-12-11 DIAGNOSIS — I2510 Atherosclerotic heart disease of native coronary artery without angina pectoris: Secondary | ICD-10-CM

## 2022-12-11 DIAGNOSIS — K922 Gastrointestinal hemorrhage, unspecified: Secondary | ICD-10-CM | POA: Diagnosis not present

## 2022-12-11 LAB — GLUCOSE, CAPILLARY
Glucose-Capillary: 283 mg/dL — ABNORMAL HIGH (ref 70–99)
Glucose-Capillary: 323 mg/dL — ABNORMAL HIGH (ref 70–99)
Glucose-Capillary: 227 mg/dL — ABNORMAL HIGH (ref 70–99)
Glucose-Capillary: 244 mg/dL — ABNORMAL HIGH (ref 70–99)
Glucose-Capillary: 184 mg/dL — ABNORMAL HIGH (ref 70–99)

## 2022-12-11 MED ORDER — ORAL CARE MOUTH RINSE: 15.0000 mL | OROMUCOSAL | Status: DC | PRN

## 2022-12-11 MED ORDER — LOPERAMIDE HCL 2 MG PO TABS: 2.0000 mg | ORAL_TABLET | Freq: Four times a day (QID) | ORAL | 0 refills | Status: AC | PRN

## 2022-12-11 MED ORDER — ATORVASTATIN CALCIUM 40 MG PO TABS: 40.0000 mg | ORAL_TABLET | Freq: Every day | ORAL | 1 refills | Status: AC

## 2022-12-11 MED ORDER — ZINC OXIDE 40 % EX OINT: TOPICAL_OINTMENT | Freq: Three times a day (TID) | CUTANEOUS | 0 refills | Status: DC | PRN

## 2022-12-11 MED ORDER — PANTOPRAZOLE SODIUM 40 MG PO TBEC: 40.0000 mg | DELAYED_RELEASE_TABLET | Freq: Two times a day (BID) | ORAL | Status: AC

## 2022-12-11 MED ORDER — ZINC OXIDE 40 % EX OINT
TOPICAL_OINTMENT | Freq: Three times a day (TID) | CUTANEOUS | Status: DC | PRN
  Filled 2022-12-11: qty 57

## 2022-12-11 NOTE — TOC Initial Note (Signed)
Transition of Care [facility]) - Initial/Assessment Note    Patient Details  Name: Jamie Strickland MRN: [id number] Date of Birth: 03/27/1946  [facility]) CM/SW Contact:    Leitha Bleak, RN Phone Number: 12/11/2022, 1:02 PM  Clinical Narrative:     Patient is in OBS for GI bleed from [facility]. TOC checked with [facility], patient will need PT eval and Auth to return. Team updated. PT ordered. Auth started, patient will be ready for discharge later today.          Expected Discharge Plan: Skilled Nursing Facility Barriers to Discharge: Continued Medical Work up   Patient Goals and CMS Choice     Expected Discharge Plan and Services       Living arrangements for the past 2 months: Skilled Nursing Facility                Prior Living Arrangements/Services Living arrangements for the past 2 months: Skilled Nursing Facility   Patient language and need for interpreter reviewed:: Yes        Need for Family Participation in Patient Care: Yes (Comment) Care giver support system in place?: Yes (comment)   Criminal Activity/Legal Involvement Pertinent to Current Situation/Hospitalization: No - Comment as needed  Activities of Daily Living      Permission Sought/Granted      Emotional Assessment       Orientation: : Oriented to Self, Oriented to Place Alcohol / Substance Use: Not Applicable Psych Involvement: No (comment)  Admission diagnosis:  GI bleed [K92.2] Patient Active Problem List   Diagnosis Date Noted   GI bleed 12/10/2022   C. 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   Mild atherosclerosis of carotid artery, right 11/22/2018   At moderate risk for fall 09/16/2018   Uncontrolled hypertension 08/24/2018   Chronic middle ear effusion, left 05/25/2018   Need for diphtheria-tetanus-pertussis (Tdap) vaccine 03/07/2018   Mild persistent asthma without complication 12/10/2017   History of echocardiogram 08/10/2017   Mild mitral regurgitation 08/10/2017   Bilateral lower extremity edema 08/02/2017   Dyspnea on exertion 08/02/2017   Hyperlipidemia associated with type 2 diabetes mellitus (HCC) 12/30/2016   Hypertension associated with type 2 diabetes mellitus (HCC) 11/13/2016   Stroke-like symptom 10/04/2016   Medically noncompliant 02/20/2015   Mild nonproliferative diabetic retinopathy (HCC) 09/25/2014   Actinic keratosis 11/13/2013   Class 2 severe obesity due to excess calories with serious comorbidity and body mass index (BMI) of 39.0 to 39.9 in adult (HCC) 02/29/2012   Encounter for medication management 08/31/2011   Uncontrolled type 2 diabetes mellitus with hyperglycemia (HCC) 08/31/2011   Hyperlipidemia LDL goal <70 12/28/2007   Allergic rhinitis 12/28/2007   OSTEOARTHRITIS 12/28/2007   COLONIC POLYPS, HX OF 12/28/2007   PCP:  Dennard Nip, NP Pharmacy:   [facility] [phone] - [place], [place] - [address] [place] [address] Phone: [phone] Fax: [phone]  [facility]) - [place], [place] - [address] [place] [address] [place] [address] Phone:  [phone] Fax: [phone]     Social Determinants of Health (SDOH) Social History: SDOH Screenings   Food Insecurity: No Food Insecurity (03/12/2022)   Received from [facility]  Transportation Needs: No Transportation Needs (04/15/2022)   Received from [facility], [facility]  Alcohol Screen: Low Risk  (06/28/2020)  Depression (PHQ2-9): Low Risk   (07/28/2021)  Financial Resource Strain: Low Risk  (04/14/2022)   Received from [facility], [facility]  Physical Activity: Sufficiently Active (06/28/2020)  Social Connections: Unknown (09/22/2021)   Received from [facility], [facility]  Stress: No Stress Concern Present (04/14/2022)   Received from [facility], [facility]  Tobacco Use: Medium Risk (12/10/2022)   SDOH Interventions:

## 2022-12-11 NOTE — Inpatient Diabetes Management (Signed)
Inpatient Diabetes Program Recommendations  AACE/ADA: New Consensus Statement on Inpatient Glycemic Control   Target Ranges:  Prepandial:   less than 140 mg/dL      Peak postprandial:   less than 180 mg/dL (1-2 hours)      Critically ill patients:  140 - 180 mg/dL    Latest Reference Range & Units 12/10/22 22:12 12/11/22 04:51 12/11/22 08:03  Glucose-Capillary 70 - 99 mg/dL 742 (H) 595 (H) 638 (H)   Review of Glycemic Control  Diabetes history: DM2 Outpatient Diabetes medications: Tresiba 40 units BID, Humalog 25-50 units TID with meals, Glipizide 10 mg BID, Metformin XR 1000 mg BID Current orders for Inpatient glycemic control: Semglee 10 units BID, Novolog 0-15 units TID with meals  Inpatient Diabetes Program Recommendations:    Insulin: Please consider increasing Semglee to 15 units BID and adding Novolog 0-5 units at bedtime for bedtime correction. Once diet ordered, may need to consider ordering Novolog meal coverage as well.  Thanks, Orlando Penner, RN, MSN, CDCES Diabetes Coordinator Inpatient Diabetes Program [phone] (Team Pager from 8am to 5pm)

## 2022-12-11 NOTE — Evaluation (Signed)
Physical Therapy Evaluation Patient Details Name: Jamie Strickland MRN: [id number] DOB: 29-Dec-1945 Today's Date: 12/11/2022  History of Present Illness  77 yo F with past medical history of type 2 diabetes, hypertension, hyperlipidemia.CAD s/p PCI 02/17/2022 was sent  by SNF facility from [facility], for abnormal labs ( elevated BUN) . GI is consulted for  FOBT positive , Elevated BUN , concerns for GI bleed     Patient is seen bedside today.  Is awake but oriented x 0, hence history is limited and taken from chart as well. denies any abdominal pain unsure if she is having any blood in the stool.  But denies any nausea or vomiting.  As per ED discussion yesterday patient had a brown bowel movement yesterday with negative DRE.  The nurse bedside also reports this morning patient had a bowel movement which was brown and liquid.  On my rectal exam stool remains brown.   Clinical Impression  Pt admitted with above diagnosis. Patient supine in bed upon therapist arrival and agreeable to participating in PT evaluation. Upon attempting bed mobility, patient was found to be soiled. Nursing contacted to assist with pericare. Patient performed rolling with cues, bed rail use and min assist. Patient required mod assist for supine to/from sit. Patient required extra time, cues for step sequencing and hand placement with RW use and min assist for sit to stand transfers. Upon standing, patient was incontinent of bowel and returned to seating at EOB. Patient encourage to stand again and take side steps along bedside toward head of bed. Patient required 3 sitting rest breaks to complete  sidesteps to head of bed. Pt currently with functional limitations due to the deficits listed below (see PT Problem List). Pt will benefit from acute skilled PT to increase their independence and safety with mobility to allow discharge.           If plan is discharge home, recommend the following: A lot of help with  bathing/dressing/bathroom;A lot of help with walking and/or transfers   Can travel by private vehicle   No    Equipment Recommendations None recommended by PT (to be determined at next venue of care)  Recommendations for Other Services       Functional Status Assessment Patient has had a recent decline in their functional status and demonstrates the ability to make significant improvements in function in a reasonable and predictable amount of time.     Precautions / Restrictions Precautions Precautions: Fall Restrictions Weight Bearing Restrictions: No      Mobility  Bed Mobility Overal bed mobility: Needs Assistance Bed Mobility: Supine to Sit, Sit to Supine     Supine to sit: Mod assist, HOB elevated Sit to supine: Mod assist   General bed mobility comments: mod assist from trunk and lower extremities; bedrail use with UEs    Transfers Overall transfer level: Needs assistance Equipment used: Rolling walker (2 wheels) Transfers: Sit to/from Stand, Bed to chair/wheelchair/BSC Sit to Stand: Min assist, Mod assist       Anterior-Posterior transfers: Mod assist   General transfer comment: cues for hand placement and step sequencing using RW; slow labored movement limited by perineum pain    Ambulation/Gait Ambulation/Gait assistance: Min assist Gait Distance (Feet): 4 Feet Assistive device: Rolling walker (2 wheels) Gait Pattern/deviations: Step-to pattern, Decreased step length - right, Decreased step length - left, Decreased stride length, Trunk flexed Gait velocity: decreased     General Gait Details: slow labored cadence using RW limited to  a few side steps along bed for safety due to lower extremity weakness and fatigue; limited primarily by fatigue; on room air throughout  Stairs            Wheelchair Mobility     Tilt Bed    Modified Rankin (Stroke Patients Only)       Balance Overall balance assessment: Needs assistance Sitting-balance  support: Bilateral upper extremity supported, Feet supported Sitting balance-Leahy Scale: Fair Sitting balance - Comments: seated at EOB   Standing balance support: Bilateral upper extremity supported, During functional activity, Reliant on assistive device for balance Standing balance-Leahy Scale: Poor Standing balance comment: fair/poor using RW        Pertinent Vitals/Pain Pain Assessment Pain Assessment: Faces Faces Pain Scale: Hurts whole lot Pain Location: perineum Pain Descriptors / Indicators: Discomfort, Sore, Grimacing, Moaning, Tender Pain Intervention(s): Limited activity within patient's tolerance, Repositioned, Monitored during session    Home Living Family/patient expects to be discharged to:: Skilled nursing facility            Prior Function           Hand Dominance        Extremity/Trunk Assessment   Upper Extremity Assessment Upper Extremity Assessment: Generalized weakness    Lower Extremity Assessment Lower Extremity Assessment: Generalized weakness    Cervical / Trunk Assessment Cervical / Trunk Assessment: Normal  Communication      Cognition Arousal/Alertness: Awake/alert Behavior During Therapy: WFL for tasks assessed/performed Overall Cognitive Status: Within Functional Limits for tasks assessed       General Comments      Exercises     Assessment/Plan    PT Assessment Patient needs continued PT services  PT Problem List Decreased strength;Decreased knowledge of use of DME;Decreased activity tolerance;Decreased balance;Decreased mobility;Pain       PT Treatment Interventions DME instruction;Balance training;Gait training;Functional mobility training;Patient/family education;Therapeutic exercise;Therapeutic activities    PT Goals (Current goals can be found in the Care Plan section)  Acute Rehab PT Goals Patient Stated Goal: Return to SNF PT Goal Formulation: With patient Time For Goal Achievement: 12/25/22 Potential  to Achieve Goals: Fair    Frequency Min 3X/week        AM-PAC PT "6 Clicks" Mobility  Outcome Measure Help needed turning from your back to your side while in a flat bed without using bedrails?: A Little Help needed moving from lying on your back to sitting on the side of a flat bed without using bedrails?: A Lot Help needed moving to and from a bed to a chair (including a wheelchair)?: A Lot Help needed standing up from a chair using your arms (e.g., wheelchair or bedside chair)?: A Little Help needed to walk in hospital room?: A Lot Help needed climbing 3-5 steps with a railing? : A Lot 6 Click Score: 14    End of Session   Activity Tolerance: Patient limited by fatigue;Patient limited by pain Patient left: in bed;with call bell/phone within reach;with nursing/sitter in room Nurse Communication: Mobility status PT Visit Diagnosis: Unsteadiness on feet (R26.81);Other abnormalities of gait and mobility (R26.89);Muscle weakness (generalized) (M62.81)    Time: 5409-8119 PT Time Calculation (min) (ACUTE ONLY): 28 min   Charges:   PT Evaluation $PT Eval Low Complexity: 1 Low PT Treatments $Therapeutic Activity: 8-22 mins PT General Charges $$ ACUTE PT VISIT: 1 Visit         Katina Dung. Hartnett-Rands, MS, PT [facility] [phone]  Britta Mccreedy  Hartnett-Rands 12/11/2022,  3:10 PM

## 2022-12-11 NOTE — Care Management Obs Status (Signed)
MEDICARE OBSERVATION STATUS NOTIFICATION   Patient Details  Name: Jamie Strickland MRN: [id number] Date of Birth: 26-Oct-1945   Medicare Observation Status Notification Given:  Yes    Corey Harold 12/11/2022, 4:34 PM

## 2022-12-11 NOTE — Plan of Care (Signed)
  Problem: Acute Rehab PT Goals(only PT should resolve) Goal: Pt will Roll Supine to Side Outcome: Progressing Flowsheets (Taken 12/11/2022 1515) Pt will Roll Supine to Side: with cues (comment type and amount) Goal: Pt Will Go Supine/Side To Sit Outcome: Progressing Flowsheets (Taken 12/11/2022 1515) Pt will go Supine/Side to Sit: with minimal assist Goal: Pt Will Go Sit To Supine/Side Outcome: Progressing Flowsheets (Taken 12/11/2022 1515) Pt will go Sit to Supine/Side: with minimal assist Goal: Patient Will Transfer Sit To/From Stand Outcome: Progressing Flowsheets (Taken 12/11/2022 1515) Patient will transfer sit to/from stand: with min guard assist Goal: Pt Will Transfer Bed To Chair/Chair To Bed Outcome: Progressing Flowsheets (Taken 12/11/2022 1515) Pt will Transfer Bed to Chair/Chair to Bed: with mod assist Goal: Pt Will Ambulate Outcome: Progressing Flowsheets (Taken 12/11/2022 1515) Pt will Ambulate:  10 feet  with moderate assist  with rolling walker   Britta Mccreedy D. Hartnett-Rands, MS, PT [facility] [phone] 12/11/2022

## 2022-12-11 NOTE — Discharge Summary (Signed)
Physician Discharge Summary   Patient: Jamie Strickland MRN: [id number] DOB: 01-29-1946  Admit date:     12/10/2022  Discharge date: 12/11/22  Discharge Physician: Vassie Loll   PCP: Dennard Nip, NP   Recommendations at discharge:  Repeat CBC to follow hemoglobin trend/stability Outpatient follow-up with gastroenterology service for endoscopic evaluation. Continue PPI twice a day Repeat basic metabolic panel to follow electrolytes renal function Reassess blood pressure and adjust antihypertensive treatment as needed Continue close monitoring of patient's CBGs with further adjustment to hypoglycemic regimen as required.  Discharge Diagnoses: Principal Problem:   GI bleed Active Problems:   Class 2 severe obesity due to excess calories with serious comorbidity and body mass index (BMI) of 39.0 to 39.9 in adult [facility])   Hypertension associated with type 2 diabetes mellitus (HCC)   Hyperlipidemia associated with type 2 diabetes mellitus (HCC)   CAD S/P percutaneous coronary angioplasty   Diarrhea of presumed infectious origin  Brief Hospital admission narrative: As per H&P written by Dr. Randol Kern on 12/10/2022 Jamie Strickland  is a 77 y.o. female, with past medical history of type 2 diabetes, hypertension, hyperlipidemia.  3 of CAD, with stent placement 02/17/2022. -Sent was sent to ED by SNF facility from [facility], for abnormal labs, that showed her BUN is elevated, with normal creatinine, as well she had 2 positive Hemoccult stools, so she was sent to ED for further evaluation, there is no bloody or black stools noted, but she has been having diarrhea for few days, no abdominal pain, no nausea, no vomiting, she is very poor historian due to dementia, history was obtained from facility and ED staff. -In ED her hemoglobin was stable at 13.3, she was Hemoccult positive, she had an episode of diarrhea while in ED, bowel movement, foul-smelling, but no melena noted, but Hemoccult  positive, ED discussed with GI on-call, who recommended IV Protonix, n.p.o. after midnight.  Assessment and Plan: Positive Hemoccult stool and concern for GI bleed -No overt bleeding appreciated -Stable hemoglobin levels -After discussing with GI service recommendations given for PPI twice a day and states to continue the use of aspirin and Plavix -Avoid the use of any NSAIDs and follow-up for endoscopic evaluation as an outpatient if family desired. -Patient tolerating diet, no nausea, no vomiting, no abdominal pain.  Diarrhea -Unclear etiology -No concerns for acute infection -As needed loperamide will be provided -Maintain adequate nutrition and hydration.  Type 2 diabetes with hyperglycemia -A1C 10.0 -Resewn home hypoglycemic regimen on recent adjusted dose such of insulin -Continue close monitoring to CBGs fluctuation with further adjustment to hypoglycemic management as required.  Coronary artery disease -Status post drug-eluting stent placement in October 2023 -No chest pain, no shortness of breath. -Continue aspirin, Plavix, statin and the use of beta-blockers -Continue outpatient follow-up with cardiology service.  Hypertension -Continue home antihypertensive agent -Low-sodium/heart healthy diet discussed with patient.  Physical deconditioning -Patient seen by physical therapy with recommendations for patient to return to skilled nursing facility care for further conditioning and rehabilitation.  Class II obesity -Low-calorie diet, and portion control discussed with patient -Body mass index is 38.97 kg/m.   Consultants: GI service Procedures performed: None Disposition: Skilled nursing facility Diet recommendation: Heart healthy/modified carbohydrates diet.  DISCHARGE MEDICATION: Allergies as of 12/11/2022       Reactions   Invokana [canagliflozin] Nausea And Vomiting, Other (See Comments)   Yeast infections   Trulicity [dulaglutide]    nausea   Codeine  Nausea And Vomiting  REACTION: n /v        Medication List     STOP taking these medications    amLODipine 10 MG tablet Commonly known as: NORVASC   Atrovent HFA 17 MCG/ACT inhaler Generic drug: ipratropium   carvedilol 12.5 MG tablet Commonly known as: COREG   cephALEXin 500 MG capsule Commonly known as: KEFLEX   chlorthalidone 25 MG tablet Commonly known as: HYGROTON   glipiZIDE 10 MG tablet Commonly known as: GLUCOTROL   lisinopril 40 MG tablet Commonly known as: ZESTRIL   sucralfate 1 GM/10ML suspension Commonly known as: CARAFATE   Tresiba FlexTouch 200 UNIT/ML FlexTouch Pen Generic drug: insulin degludec       TAKE these medications    Accu-Chek Guide test strip Generic drug: glucose blood TEST UP TO 4 TIMES DAILY AS DIRECTED   albuterol 108 (90 Base) MCG/ACT inhaler Commonly known as: ProAir HFA INHALE 1-2 PUFFS INTO THE LUNGS EVERY 4 (FOUR) HOURS AS NEEDED FOR WHEEZING OR SHORTNESS OF BREATH   albuterol (2.5 MG/3ML) 0.083% nebulizer solution Commonly known as: PROVENTIL USE 1 VIAL VIA NEBULIZER EVERY 4 HOURS AS NEEDED FOR WHEEZING/SHORTNESS OF BREATH   aspirin EC 81 MG tablet Take 81 mg by mouth daily.   atorvastatin 40 MG tablet Commonly known as: LIPITOR Take 1 tablet (40 mg total) by mouth daily. Start taking on: December 12, 2022 What changed:  medication strength how much to take how to take this when to take this additional instructions   BD Insulin Syringe U/F 31G X 5/16" 0.5 ML Misc Generic drug: Insulin Syringe-Needle U-100 USE AS DIRECTED   BD Pen Needle Nano 2nd Gen 32G X 4 MM Misc Generic drug: Insulin Pen Needle FOR WEEKLY INJECTION WITH OZEMPIC   blood glucose meter kit and supplies Dispense based on patient and insurance preference. Use up to four times daily as directed. (FOR ICD-10 E10.9, E11.9).   Cholecalciferol 100 MCG (4000 UT) Tabs Take 1 tablet by mouth daily.   clopidogrel 75 MG tablet Commonly known as:  PLAVIX Take 1 tablet (75 mg total) by mouth daily.   empagliflozin 10 MG Tabs tablet Commonly known as: JARDIANCE Take 10 mg by mouth daily.   fluticasone 50 MCG/ACT nasal spray Commonly known as: FLONASE SPRAY 2 SPRAYS INTO EACH NOSTRIL EVERY DAY   hydrALAZINE 50 MG tablet Commonly known as: APRESOLINE TAKE 1 TABLET BY MOUTH THREE TIMES A DAY   insulin lispro 100 UNIT/ML injection Commonly known as: HumaLOG INJECT 25 UNITS INTO THE SKIN 3 TIMES DAILY BEFORE MEALS What changed:  how much to take when to take this additional instructions Another medication with the same name was removed. Continue taking this medication, and follow the directions you see here.   Lantus SoloStar 100 UNIT/ML Solostar Pen Generic drug: insulin glargine Inject 30 Units into the skin 2 (two) times daily.   levocetirizine 5 MG tablet Commonly known as: XYZAL TAKE 1 TABLET BY MOUTH EVERY DAY IN THE EVENING   lisinopril-hydrochlorothiazide 20-12.5 MG tablet Commonly known as: ZESTORETIC Take 1 tablet by mouth 2 (two) times daily.   liver oil-zinc oxide 40 % ointment Commonly known as: DESITIN Apply topically every 8 (eight) hours as needed for irritation.   loperamide 2 MG tablet Commonly known as: IMODIUM A-D Take 1 tablet (2 mg total) by mouth 4 (four) times daily as needed for diarrhea or loose stools.   metFORMIN 500 MG 24 hr tablet Commonly known as: GLUCOPHAGE-XR TAKE 2 TABLETS (1,000 MG TOTAL) BY  MOUTH 2 (TWO) TIMES DAILY WITH A MEAL.   metoprolol succinate 100 MG 24 hr tablet Commonly known as: TOPROL-XL Take 100 mg by mouth daily.   montelukast 10 MG tablet Commonly known as: SINGULAIR TAKE 1 TABLET BY MOUTH EVERYDAY AT BEDTIME   nitroGLYCERIN 0.4 MG SL tablet Commonly known as: NITROSTAT Place 0.4 mg under the tongue every 5 (five) minutes as needed for chest pain.   ondansetron 4 MG disintegrating tablet Commonly known as: ZOFRAN-ODT Take 4 mg by mouth every 4 (four)  hours as needed for nausea or vomiting.   pantoprazole 40 MG tablet Commonly known as: PROTONIX Take 1 tablet (40 mg total) by mouth 2 (two) times daily. What changed: when to take this   spironolactone 50 MG tablet Commonly known as: ALDACTONE Take 50 mg by mouth daily.   Wixela Inhub 250-50 MCG/ACT Aepb Generic drug: fluticasone-salmeterol INHALE 1 PUFF BY MOUTH TWICE A DAY What changed: Another medication with the same name was removed. Continue taking this medication, and follow the directions you see here.        Follow-up Information     Dennard Nip, NP. Schedule an appointment as soon as possible for a visit in 10 day(s).   Specialty: Nurse Practitioner Contact information: [address] [place] [address] [phone]                Discharge Exam: Ceasar Mons Weights   12/10/22 1728  Weight: 99.8 kg   General exam: No fever, no chest pain, no shortness of breath; patient denies nausea and vomiting.  No overt bleeding appreciated. Respiratory system: Clear to auscultation. Respiratory effort normal.  Good saturation on room air. Cardiovascular system:RRR. No rubs or gallops. Gastrointestinal system: Abdomen is nondistended, soft and nontender. No organomegaly or masses felt. Normal bowel sounds heard. Central nervous system: Alert and oriented. No focal neurological deficits. Extremities: No cyanosis, clubbing or edema. Skin: No petechiae, no open wounds.  Irritated/redness bottom from diarrhea appreciated.  Keeping area clean, dry and using Desitin recommended. Psychiatry: Stable mood.   Condition at discharge: Stable and improved.  The results of significant diagnostics from this hospitalization (including imaging, microbiology, ancillary and laboratory) are listed below for reference.   Imaging Studies: No results found.  Microbiology: Results for orders placed or performed in visit on 07/05/19  Novel Coronavirus, NAA ([facility])      Status: None   Collection Time: 07/05/19 12:00 AM   Specimen: Saline   SALINE  IS THIS TEST F  Result Value Ref Range Status   SARS-CoV-2, NAA Not Detected Not Detected Final    Comment: This nucleic acid amplification test was developed and its performance characteristics determined by [employer]. Nucleic acid amplification tests include RT-PCR and TMA. This test has not been FDA cleared or approved. This test has been authorized by FDA under an Emergency Use Authorization (EUA). This test is only authorized for the duration of time the declaration that circumstances exist justifying the authorization of the emergency use of in vitro diagnostic tests for detection of SARS-CoV-2 virus and/or diagnosis of COVID-19 infection under section 564(b)(1) of the Act, 21 U.S.C. 914NWG-9(F) (1), unless the authorization is terminated or revoked sooner. When diagnostic testing is negative, the possibility of a false negative result should be considered in the context of a patient's recent exposures and the presence of clinical signs and symptoms consistent with COVID-19. An individual without symptoms of COVID-19 and who is not shedding SARS-CoV-2 virus wo uld  expect to have a negative (not detected) result in this assay.     Labs: CBC: Recent Labs  Lab 12/10/22 1712 12/11/22 0408  WBC 9.7 11.9*  NEUTROABS 8.4*  --   HGB 13.3 12.9  HCT 40.1 39.9  MCV 85.3 86.4  PLT 251 227   Basic Metabolic Panel: Recent Labs  Lab 12/10/22 1638 12/11/22 0408  NA 137 142  K 4.3 3.6  CL 113* 114*  CO2 16* 19*  GLUCOSE 347* 338*  BUN 63* 48*  CREATININE 0.87 0.85  CALCIUM 9.0 9.3   Liver Function Tests: Recent Labs  Lab 12/10/22 1638  AST 13*  ALT 13  ALKPHOS 99  BILITOT 0.5  PROT 6.9  ALBUMIN 3.1*   CBG: Recent Labs  Lab 12/10/22 2212 12/11/22 0451 12/11/22 0803 12/11/22 1131  GLUCAP 347* 283* 323* 227*    Discharge time spent: less than 30  minutes.  Signed: Vassie Loll, MD [facility] 12/11/2022

## 2022-12-11 NOTE — Consult Note (Addendum)
Vista Lawman, M.D. Gastroenterology & Hepatology                                        Patient Name: Jamie Strickland  MRN: [id number] Admission Date: 12/10/2022 Date of Evaluation:  12/11/2022 Time of Evaluation: 11:38 AM  Chief Complaint:  FOBT positive , Elevated BUN , concerns for GI bleed  HPI:   77 yo F with past medical history of type 2 diabetes, hypertension, hyperlipidemia.CAD s/p PCI 02/17/2022 was sent  by SNF facility from [facility], for abnormal labs ( elevated BUN) . GI is consulted for  FOBT positive , Elevated BUN , concerns for GI bleed  Patient is seen bedside today.  Is awake but oriented x 0, hence history is limited and taken from chart as well. denies any abdominal pain unsure if she is having any blood in the stool.  But denies any nausea or vomiting.  As per ED discussion yesterday patient had a brown bowel movement yesterday with negative DRE.  The nurse bedside also reports this morning patient had a bowel movement which was brown and liquid.  On my rectal exam stool remains brown  Last colonoscopy 2016 at [facility] with 2 diminutive polyps No upper endoscopy on file  Past Medical History: SEE CHRONIC ISSSUES: Past Medical History:  Diagnosis Date   Allergic rhinitis    Diabetes mellitus type II    Hx of colonic polyps    Hyperlipidemia    Hypertension    Mild atherosclerosis of carotid artery, right 11/22/2018   Mild intermittent allergic asthma without complication 12/28/2007   Qualifier: Diagnosis of  By: Alphonsus Sias MD, Ronnette Hila    Mild mitral regurgitation 08/10/2017   Mild nonproliferative diabetic retinopathy (HCC) 09/25/2014   Dxed Dr Blair Promise, 09/11/2014.   Changes noted in bilateral eyes?    Osteoarthritis    Past Surgical History:  Past Surgical History:  Procedure Laterality Date   ABDOMINAL HYSTERECTOMY  1972   Cervical carcinoma in situ, Full, no ovaries/tubes   CATARACT EXTRACTION W/ INTRAOCULAR LENS  IMPLANT, BILATERAL Bilateral  2014   CATARACT EXTRACTION W/ INTRAOCULAR LENS  IMPLANT, BILATERAL  11/14   CHOLECYSTECTOMY     LASIK     Family History:  Family History  Problem Relation Age of Onset   Diabetes Mother    Heart disease Mother    Diabetes Father        brittle diabetes   Asthma Father 14   Liver disease Father        liver problems   Heart disease Father    Heart disease Brother        CAD   Cancer Maternal Aunt        Breast   Heart disease Maternal Grandfather        CAD   Cancer Brother        lung   Social History:  Social History   Tobacco Use   Smoking status: Former    Current packs/day: 0.50    Average packs/day: 0.5 packs/day for 50.0 years (25.0 ttl pk-yrs)    Types: Cigarettes   Smokeless tobacco: Former    Quit date: 08/09/2008  Substance Use Topics   Alcohol use: Yes    Alcohol/week: 0.0 standard drinks of alcohol    Comment: Rare   Drug use: No    Home Medications:  Prior to  Admission medications   Medication Sig Start Date End Date Taking? Authorizing Provider  albuterol (PROAIR HFA) 108 (90 Base) MCG/ACT inhaler INHALE 1-2 PUFFS INTO THE LUNGS EVERY 4 (FOUR) HOURS AS NEEDED FOR WHEEZING OR SHORTNESS OF BREATH 06/07/19  Yes Breeback, Jade L, PA-C  albuterol (PROVENTIL) (2.5 MG/3ML) 0.083% nebulizer solution USE 1 VIAL VIA NEBULIZER EVERY 4 HOURS AS NEEDED FOR WHEEZING/SHORTNESS OF BREATH 09/01/21  Yes Breeback, Jade L, PA-C  amLODipine (NORVASC) 10 MG tablet TAKE 1 TABLET BY MOUTH EVERY DAY 03/28/21  Yes Breeback, Jade L, PA-C  aspirin EC 81 MG tablet Take 81 mg by mouth daily.   Yes [provider]  atorvastatin (LIPITOR) 20 MG tablet TAKE 1 TABLET BY MOUTH EVERY DAY/ needs labs Patient taking differently: Take 80 mg by mouth daily. 01/30/21  Yes Breeback, Jade L, PA-C  Cholecalciferol 100 MCG (4000 UT) TABS Take 1 tablet by mouth daily.   Yes [provider]  clopidogrel (PLAVIX) 75 MG tablet Take 1 tablet (75 mg total) by mouth daily. 01/29/20  Yes  Breeback, Jade L, PA-C  empagliflozin (JARDIANCE) 10 MG TABS tablet Take 10 mg by mouth daily.   Yes [provider]  fluticasone-salmeterol (ADVAIR DISKUS) 250-50 MCG/ACT AEPB Inhale 1 puff into the lungs in the morning and at bedtime.   Yes [provider]  hydrALAZINE (APRESOLINE) 50 MG tablet TAKE 1 TABLET BY MOUTH THREE TIMES A DAY 08/28/21  Yes Breeback, Jade L, PA-C  insulin lispro (HUMALOG) 100 UNIT/ML injection INJECT 25 UNITS INTO THE SKIN 3 TIMES DAILY BEFORE MEALS Patient taking differently: 15 Units 3 (three) times daily with meals. INJECT 15 UNITS INTO THE SKIN 3 TIMES DAILY BEFORE MEALS 12/11/21  Yes Breeback, Jade L, PA-C  LANTUS SOLOSTAR 100 UNIT/ML Solostar Pen Inject 30 Units into the skin 2 (two) times daily.   Yes [provider]  lisinopril-hydrochlorothiazide (ZESTORETIC) 20-12.5 MG tablet Take 1 tablet by mouth 2 (two) times daily.   Yes [provider]  metFORMIN (GLUCOPHAGE-XR) 500 MG 24 hr tablet TAKE 2 TABLETS (1,000 MG TOTAL) BY MOUTH 2 (TWO) TIMES DAILY WITH A MEAL. 05/06/21  Yes Breeback, Jade L, PA-C  metoprolol succinate (TOPROL-XL) 100 MG 24 hr tablet Take 100 mg by mouth daily.   Yes [provider]  nitroGLYCERIN (NITROSTAT) 0.4 MG SL tablet Place 0.4 mg under the tongue every 5 (five) minutes as needed for chest pain.   Yes [provider]  ondansetron (ZOFRAN-ODT) 4 MG disintegrating tablet Take 4 mg by mouth every 4 (four) hours as needed for nausea or vomiting.   Yes [provider]  pantoprazole (PROTONIX) 40 MG tablet Take 40 mg by mouth daily.   Yes [provider]  spironolactone (ALDACTONE) 50 MG tablet Take 50 mg by mouth daily.   Yes [provider]  sucralfate (CARAFATE) 1 GM/10ML suspension Take 1 g by mouth 3 (three) times daily. 12/04/22  Yes [provider]  Monte Fantasia INHUB 250-50 MCG/ACT AEPB INHALE 1 PUFF BY MOUTH TWICE A DAY 01/30/21  Yes Breeback, Jade L, PA-C   ACCU-CHEK GUIDE test strip TEST UP TO 4 TIMES DAILY AS DIRECTED 12/08/21   Breeback, Jade L, PA-C  ATROVENT HFA 17 MCG/ACT inhaler INHALE 2-3 PUFFS INTO THE LUNGS EVERY 6 HOURS AS NEEDED FOR WHEEZING. Patient not taking: Reported on 12/11/2022 12/11/21   Jomarie Longs, PA-C  BD INSULIN SYRINGE U/F 31G X 5/16" 0.5 ML MISC USE AS DIRECTED 08/28/21   Breeback,  Jade L, PA-C  BD PEN NEEDLE NANO 2ND GEN 32G X 4 MM MISC FOR WEEKLY INJECTION WITH OZEMPIC 12/16/21   Breeback, Jade L, PA-C  blood glucose meter kit and supplies Dispense based on patient and insurance preference. Use up to four times daily as directed. (FOR ICD-10 E10.9, E11.9). 03/06/20   Breeback, Lonna Cobb, PA-C  carvedilol (COREG) 12.5 MG tablet Take 1 tablet (12.5 mg total) by mouth 2 (two) times daily with a meal. Patient not taking: Reported on 12/11/2022 01/29/20   Jomarie Longs, PA-C  cephALEXin (KEFLEX) 500 MG capsule Take 1 capsule (500 mg total) by mouth 4 (four) times daily. Patient not taking: Reported on 12/11/2022 06/29/22   Ernie Avena, MD  chlorthalidone (HYGROTON) 25 MG tablet TAKE 1 TABLET (25 MG TOTAL) BY MOUTH DAILY. Patient not taking: Reported on 12/11/2022 05/06/21   Tandy Gaw L, PA-C  fluticasone (FLONASE) 50 MCG/ACT nasal spray SPRAY 2 SPRAYS INTO EACH NOSTRIL EVERY DAY Patient not taking: Reported on 12/11/2022 05/19/21   Tandy Gaw L, PA-C  glipiZIDE (GLUCOTROL) 10 MG tablet TAKE 1 TABLET (10 MG TOTAL) BY MOUTH 2 (TWO) TIMES DAILY BEFORE A MEAL. Patient not taking: Reported on 12/11/2022 01/30/21   Tandy Gaw L, PA-C  insulin degludec (TRESIBA FLEXTOUCH) 200 UNIT/ML FlexTouch Pen INJECT 40 UNITS IN MORNING & AT BEDTIME-TITRATE BY 2UNITS EVERY 3 DAYS UNTIL FASTING GLUCOSE 90-130/ NEEDS APPT Patient not taking: Reported on 12/11/2022 01/15/22   Tandy Gaw L, PA-C  insulin lispro (HUMALOG KWIKPEN) 200 UNIT/ML KwikPen Inject 25-50 Units into the skin 3 (three) times daily before meals. Needs appt Patient not taking:  Reported on 12/11/2022 01/16/22   Tandy Gaw L, PA-C  levocetirizine (XYZAL) 5 MG tablet TAKE 1 TABLET BY MOUTH EVERY DAY IN THE EVENING Patient not taking: Reported on 12/11/2022 01/30/21   Jomarie Longs, PA-C  lisinopril (ZESTRIL) 40 MG tablet Take 1 tablet (40 mg total) by mouth daily. Needs appointment. Patient not taking: Reported on 12/11/2022 06/08/22   Tandy Gaw L, PA-C  montelukast (SINGULAIR) 10 MG tablet TAKE 1 TABLET BY MOUTH EVERYDAY AT BEDTIME Patient not taking: Reported on 12/11/2022 01/30/21   Jomarie Longs, PA-C    Inpatient Medications:  Current Facility-Administered Medications:    atorvastatin (LIPITOR) tablet 40 mg, 40 mg, Oral, Daily, Elgergawy, Leana Roe, MD, 40 mg at 12/11/22 0826   carvedilol (COREG) tablet 12.5 mg, 12.5 mg, Oral, BID WC, Elgergawy, Leana Roe, MD, 12.5 mg at 12/11/22 1610   clopidogrel (PLAVIX) tablet 75 mg, 75 mg, Oral, Daily, Elgergawy, Leana Roe, MD, 75 mg at 12/11/22 0827   hydrALAZINE (APRESOLINE) injection 5 mg, 5 mg, Intravenous, Q4H PRN, Elgergawy, Leana Roe, MD   insulin aspart (novoLOG) injection 0-15 Units, 0-15 Units, Subcutaneous, TID WC, Elgergawy, Leana Roe, MD, 11 Units at 12/11/22 0834   insulin glargine-yfgn (SEMGLEE) injection 10 Units, 10 Units, Subcutaneous, BID, Elgergawy, Leana Roe, MD, 10 Units at 12/10/22 2351   lisinopril (ZESTRIL) tablet 40 mg, 40 mg, Oral, Daily, Elgergawy, Leana Roe, MD, 40 mg at 12/11/22 0826   montelukast (SINGULAIR) tablet 10 mg, 10 mg, Oral, QHS, Elgergawy, Leana Roe, MD, 10 mg at 12/10/22 2114   Oral care mouth rinse, 15 mL, Mouth Rinse, PRN, Katrinka Blazing, Rondell A, MD   pantoprazole (PROTONIX) injection 40 mg, 40 mg, Intravenous, BID, Elgergawy, Leana Roe, MD, 40 mg at 12/11/22 9604 Allergies: Invokana [canagliflozin], Trulicity [dulaglutide], and Codeine  Complete Review of Systems: GENERAL: negative for malaise, night sweats  HEENT: No changes in hearing or vision, no nose bleeds or other nasal problems. NECK:  Negative for lumps, goiter, pain and significant neck swelling RESPIRATORY: Negative for cough, wheezing CARDIOVASCULAR: Negative for chest pain, leg swelling, palpitations, orthopnea GI: SEE HPI MUSCULOSKELETAL: Negative for joint pain or swelling, back pain, and muscle pain. SKIN: Negative for lesions, rash PSYCH: Negative for sleep disturbance, mood disorder and recent psychosocial stressors. HEMATOLOGY Negative for prolonged bleeding, bruising easily, and swollen nodes. ENDOCRINE: Negative for cold or heat intolerance, polyuria, polydipsia and goiter. NEURO: negative for tremor, gait imbalance, syncope and seizures. The remainder of the review of systems is noncontributory.  Physical Exam: BP (!) 150/50   Pulse 73   Temp 98.2 F (36.8 C) (Oral)   Resp 20   Ht 5\' 3"  (1.6 m)   Wt 99.8 kg   SpO2 99%   BMI 38.97 kg/m  GENERAL: The patient is AO x0 HEENT: Head is normocephalic and atraumatic. EOMI are intact. Mouth is well hydrated and without lesions. NECK: Supple. No masses LUNGS: Clear to auscultation. No presence of rhonchi/wheezing/rales. Adequate chest expansion HEART: RRR, normal s1 and s2. ABDOMEN: Soft, nontender, no guarding, no peritoneal signs, and nondistended. BS +. No masses. RECTAL EXAM: no external lesions, normal tone, no masses, brown stool without blood. EXTREMITIES: Without any cyanosis, clubbing, rash, lesions or edema. SKIN: no jaundice, no rashes  Laboratory Data CBC:     Component Value Date/Time   WBC 11.9 (H) 12/11/2022 0408   RBC 4.62 12/11/2022 0408   HGB 12.9 12/11/2022 0408   HGB 8.6 (L) 08/08/2019 1352   HCT 39.9 12/11/2022 0408   PLT 227 12/11/2022 0408   PLT 354 08/08/2019 1352   MCV 86.4 12/11/2022 0408   MCH 27.9 12/11/2022 0408   MCHC 32.3 12/11/2022 0408   RDW 15.1 12/11/2022 0408   LYMPHSABS 0.6 (L) 12/10/2022 1712   MONOABS 0.7 12/10/2022 1712   EOSABS 0.1 12/10/2022 1712   BASOSABS 0.0 12/10/2022 1712   COAG: No results  found for: "INR", "PROTIME"  BMP:     Latest Ref Rng & Units 12/11/2022    4:08 AM 12/10/2022    4:38 PM 06/28/2022    4:47 PM  BMP  Glucose 70 - 99 mg/dL 161  096  045   BUN 8 - 23 mg/dL 48  63  36   Creatinine 0.44 - 1.00 mg/dL 4.09  8.11  9.14   Sodium 135 - 145 mmol/L 142  137  130   Potassium 3.5 - 5.1 mmol/L 3.6  4.3  4.5   Chloride 98 - 111 mmol/L 114  113  94   CO2 22 - 32 mmol/L 19  16  23    Calcium 8.9 - 10.3 mg/dL 9.3  9.0  9.5     HEPATIC:     Latest Ref Rng & Units 12/10/2022    4:38 PM 07/28/2021   12:00 AM 01/27/2021   12:00 AM  Hepatic Function  Total Protein 6.5 - 8.1 g/dL 6.9  7.2  7.2   Albumin 3.5 - 5.0 g/dL 3.1     AST 15 - 41 U/L 13  22  32   ALT 0 - 44 U/L 13  33  49   Alk Phosphatase 38 - 126 U/L 99     Total Bilirubin 0.3 - 1.2 mg/dL 0.5  0.3  0.4     CARDIAC: No results found for: "CKTOTAL", "CKMB", "CKMBINDEX", "TROPONINI"   Imaging: I personally reviewed and  interpreted the available imaging.  Assessment & Plan:  77 yo F with past medical history of type 2 diabetes, hypertension, hyperlipidemia.CAD s/p PCI 02/17/2022 was sent  by SNF facility from [facility], for abnormal labs ( elevated BUN) . GI is consulted for  FOBT positive , Elevated BUN , concerns for GI bleed  #Elevated BUN  #Positive FOBT  Patient does not have any evidence of overt GI bleed: No coffee-ground emesis, hematemesis, hematochezia, or melena  Rectal exam done yesterday and also today with brown stools.  Patient had at least 2 bowel movement since yesterday without any overt blood in stool  Baseline hemoglobin 12-13 and on repeat remains 13.3 and 12.9 hence no significant drop is noted.  BUN is also trending down  FOBT is a test for screening of colon cancer and does not have a role in evaluating for active GI bleed as many things can cause a positive FOBT in an admitted patient, including the trauma from rectal exam itself.  Given that there is no evidence that the patient is  actively having a GI bleed, no indication for inpatient urgent endoscopic intervention at this time  Recs:  -Will eventually need colonoscopy and/our discussion regarding the positive FOBT for the purpose of colon cancer screening as outpatient if in line with goals of care .  Patient can follow-up in the GI clinic as outpatient if family desires EGD/colonoscopy  -Given that there is no evidence that the patient is actively having a GI bleed, no indication for inpatient urgent endoscopic  intervention at this time  -No absolute GI contraindication to continuing antiplatelet drugs given clinical indication (CAD with stents ) while patient is monitored  Vista Lawman, MD Gastroenterology and Hepatology [facility]  This chart has been completed using [facility] Dictation software, and while attempts have been made to ensure accuracy , certain words and phrases may not be transcribed as intended

## 2022-12-12 LAB — GLUCOSE, CAPILLARY
Glucose-Capillary: 220 mg/dL — ABNORMAL HIGH (ref 70–99)
Glucose-Capillary: 330 mg/dL — ABNORMAL HIGH (ref 70–99)
Glucose-Capillary: 287 mg/dL — ABNORMAL HIGH (ref 70–99)
Glucose-Capillary: 332 mg/dL — ABNORMAL HIGH (ref 70–99)

## 2022-12-12 MED ORDER — PANTOPRAZOLE SODIUM 40 MG PO TBEC
40.0000 mg | DELAYED_RELEASE_TABLET | Freq: Two times a day (BID) | ORAL | Status: DC
  Administered 2022-12-12 – 2022-12-14 (×5): 40 mg via ORAL
  Filled 2022-12-12 (×5): qty 1

## 2022-12-12 NOTE — Plan of Care (Signed)
  Problem: Skin Integrity: Goal: Risk for impaired skin integrity will decrease Outcome: Progressing   Apply Desitin cream at each change to buttocks and perineal area to protect skin

## 2022-12-12 NOTE — Progress Notes (Signed)
Chart review; hemodynamically stable, no overnight events.  Patient failed to be transported back to skilled nursing facility as initially planned on 12/11/2022.  No changes needed to discharge summary written on 12/11/2022 (please refer to wait for discharge instructions and recommendations); patient clinically stable for discharge.  Vassie Loll MD [phone]

## 2022-12-13 LAB — GLUCOSE, CAPILLARY
Glucose-Capillary: 272 mg/dL — ABNORMAL HIGH (ref 70–99)
Glucose-Capillary: 288 mg/dL — ABNORMAL HIGH (ref 70–99)
Glucose-Capillary: 296 mg/dL — ABNORMAL HIGH (ref 70–99)
Glucose-Capillary: 308 mg/dL — ABNORMAL HIGH (ref 70–99)

## 2022-12-13 MED ORDER — ONDANSETRON HCL 4 MG PO TABS
4.0000 mg | ORAL_TABLET | Freq: Once | ORAL | Status: AC
  Administered 2022-12-14: 4 mg via ORAL
  Filled 2022-12-13 (×2): qty 1

## 2022-12-13 NOTE — Plan of Care (Signed)
  Problem: Nutrition: Goal: Adequate nutrition will be maintained Outcome: Progressing   Problem: Coping: Goal: Level of anxiety will decrease Outcome: Progressing   Problem: Safety: Goal: Ability to remain free from injury will improve Outcome: Progressing   Problem: Nutritional: Goal: Maintenance of adequate nutrition will improve Outcome: Progressing   Problem: Education: Goal: Knowledge of General Education information will improve Description: Including pain rating scale, medication(s)/side effects and non-pharmacologic comfort measures Outcome: Adequate for Discharge   Problem: Health Behavior/Discharge Planning: Goal: Ability to manage health-related needs will improve Outcome: Adequate for Discharge   Problem: Pain Managment: Goal: General experience of comfort will improve Outcome: Adequate for Discharge

## 2022-12-13 NOTE — Progress Notes (Signed)
Patient has remained hemodynamically stable; no overnight events reported.  At this moment continue awaiting insurance authorization to return back to her skilled nursing facility for further care and conditioning.  Please refer to discharge summary written on 12/11/2022 for further info/details and discharge instructions.  No changes required; patient medically stable for discharge.   Vassie Loll MD [phone]

## 2022-12-14 DIAGNOSIS — Z7401 Bed confinement status: Secondary | ICD-10-CM | POA: Diagnosis not present

## 2022-12-14 DIAGNOSIS — I959 Hypotension, unspecified: Secondary | ICD-10-CM | POA: Diagnosis not present

## 2022-12-14 LAB — GLUCOSE, CAPILLARY
Glucose-Capillary: 266 mg/dL — ABNORMAL HIGH (ref 70–99)
Glucose-Capillary: 323 mg/dL — ABNORMAL HIGH (ref 70–99)

## 2022-12-14 NOTE — Progress Notes (Signed)
Mobility Specialist Progress Note:    12/14/22 1105  Mobility  Activity Refused mobility   Pt refused mobility despite max encouragement. Left pt in bed, all needs met.   Lawerance Bach Mobility Specialist Please contact via Special educational needs teacher or  Rehab office at [phone]

## 2022-12-14 NOTE — Plan of Care (Signed)

## 2022-12-14 NOTE — Progress Notes (Signed)
Chart and discharge summary reviewed; patient appears hemodynamically stable, no overt bleeding or overnight events has been reported.  Tolerating diet without problems.  Patient discharge has continued to be delayed while awaiting insurance authorization to return to the same skilled nursing facility that she came from.  Patient is medically stable and ready to return back to her facility once insurance barriers resolved.   Vassie Loll MD [phone]

## 2022-12-14 NOTE — Progress Notes (Signed)
Per chart review, discharge pending insurance authorization for patient to return to nursing facility. Patient has outpatient GI follow up on 8/8 with our team for further discussion of positive FOBT while inpatient.    GI will sign off.

## 2022-12-14 NOTE — TOC Transition Note (Signed)
Transition of Care [facility]) - CM/SW Discharge Note   Patient Details  Name: Jamie Strickland MRN: [id number] Date of Birth: 11/22/1945  [facility] CM/SW Contact:  Leitha Bleak, RN Phone Number: 12/14/2022, 12:02 PM   Clinical Narrative:   NAVI did not approve for SNF. [facility] accepted back under LTC. DC summary sent, RN calling report. TOC will schedule EMS when patient is ready. Updated her son, [name].    Final next level of care: Long Term Nursing Home Barriers to Discharge: Barriers Resolved  Patient Goals and CMS Choice CMS Medicare.gov Compare Post Acute Care list provided to:: Patient Represenative (must comment) Choice offered to / list presented to : Adult Children  Discharge Placement                Patient to be transferred to facility by: EMS Name of family member notified: SON, [name] Patient and family notified of of transfer: 12/14/22  Discharge Plan and Services Additional resources added to the After Visit Summary for          Social Determinants of Health (SDOH) Interventions SDOH Screenings   Food Insecurity: No Food Insecurity (03/12/2022)   Received from [facility], [facility]  Transportation Needs: No Transportation Needs (04/15/2022)   Received from [facility], [facility]  Alcohol Screen: Low Risk  (06/28/2020)  Depression (PHQ2-9): Low Risk  (07/28/2021)  Financial Resource Strain: Low Risk  (04/14/2022)   Received from [facility], [facility]  Physical Activity: Sufficiently Active (06/28/2020)  Social Connections: Unknown (09/22/2021)   Received from [facility], [facility]  Stress: No Stress Concern Present (04/14/2022)   Received from [facility], [facility]  Tobacco Use: Medium Risk (12/10/2022)

## 2022-12-15 DIAGNOSIS — E1165 Type 2 diabetes mellitus with hyperglycemia: Secondary | ICD-10-CM | POA: Diagnosis not present

## 2022-12-15 DIAGNOSIS — R197 Diarrhea, unspecified: Secondary | ICD-10-CM | POA: Diagnosis not present

## 2022-12-15 DIAGNOSIS — K922 Gastrointestinal hemorrhage, unspecified: Secondary | ICD-10-CM | POA: Diagnosis not present

## 2022-12-15 DIAGNOSIS — Z79899 Other long term (current) drug therapy: Secondary | ICD-10-CM | POA: Diagnosis not present

## 2022-12-16 DIAGNOSIS — M6281 Muscle weakness (generalized): Secondary | ICD-10-CM | POA: Diagnosis not present

## 2022-12-17 ENCOUNTER — Ambulatory Visit: Payer: Medicare Other | Admitting: Gastroenterology

## 2022-12-17 DIAGNOSIS — M6281 Muscle weakness (generalized): Secondary | ICD-10-CM | POA: Diagnosis not present

## 2022-12-18 DIAGNOSIS — M6281 Muscle weakness (generalized): Secondary | ICD-10-CM | POA: Diagnosis not present

## 2022-12-21 DIAGNOSIS — M6281 Muscle weakness (generalized): Secondary | ICD-10-CM | POA: Diagnosis not present

## 2022-12-22 ENCOUNTER — Ambulatory Visit: Payer: Medicare Other | Admitting: Gastroenterology

## 2022-12-22 DIAGNOSIS — M6281 Muscle weakness (generalized): Secondary | ICD-10-CM | POA: Diagnosis not present

## 2022-12-23 DIAGNOSIS — M6281 Muscle weakness (generalized): Secondary | ICD-10-CM | POA: Diagnosis not present

## 2022-12-25 DIAGNOSIS — M6281 Muscle weakness (generalized): Secondary | ICD-10-CM | POA: Diagnosis not present

## 2022-12-26 DIAGNOSIS — M6281 Muscle weakness (generalized): Secondary | ICD-10-CM | POA: Diagnosis not present

## 2022-12-27 DIAGNOSIS — M6281 Muscle weakness (generalized): Secondary | ICD-10-CM | POA: Diagnosis not present

## 2022-12-28 DIAGNOSIS — Z79899 Other long term (current) drug therapy: Secondary | ICD-10-CM | POA: Diagnosis not present

## 2022-12-28 DIAGNOSIS — J302 Other seasonal allergic rhinitis: Secondary | ICD-10-CM | POA: Diagnosis not present

## 2022-12-28 DIAGNOSIS — K922 Gastrointestinal hemorrhage, unspecified: Secondary | ICD-10-CM | POA: Diagnosis not present

## 2022-12-29 DIAGNOSIS — D649 Anemia, unspecified: Secondary | ICD-10-CM | POA: Diagnosis not present

## 2022-12-29 DIAGNOSIS — Z79899 Other long term (current) drug therapy: Secondary | ICD-10-CM | POA: Diagnosis not present

## 2022-12-29 DIAGNOSIS — M6281 Muscle weakness (generalized): Secondary | ICD-10-CM | POA: Diagnosis not present

## 2022-12-29 DIAGNOSIS — Z043 Encounter for examination and observation following other accident: Secondary | ICD-10-CM | POA: Diagnosis not present

## 2022-12-29 DIAGNOSIS — R531 Weakness: Secondary | ICD-10-CM | POA: Diagnosis not present

## 2022-12-29 DIAGNOSIS — K922 Gastrointestinal hemorrhage, unspecified: Secondary | ICD-10-CM | POA: Diagnosis not present

## 2022-12-30 DIAGNOSIS — E785 Hyperlipidemia, unspecified: Secondary | ICD-10-CM | POA: Diagnosis not present

## 2022-12-30 DIAGNOSIS — R531 Weakness: Secondary | ICD-10-CM | POA: Diagnosis not present

## 2022-12-30 DIAGNOSIS — K922 Gastrointestinal hemorrhage, unspecified: Secondary | ICD-10-CM | POA: Diagnosis not present

## 2022-12-30 DIAGNOSIS — E441 Mild protein-calorie malnutrition: Secondary | ICD-10-CM | POA: Diagnosis not present

## 2022-12-30 DIAGNOSIS — N390 Urinary tract infection, site not specified: Secondary | ICD-10-CM | POA: Diagnosis not present

## 2022-12-30 DIAGNOSIS — M1990 Unspecified osteoarthritis, unspecified site: Secondary | ICD-10-CM | POA: Diagnosis not present

## 2022-12-30 DIAGNOSIS — R197 Diarrhea, unspecified: Secondary | ICD-10-CM | POA: Diagnosis not present

## 2022-12-30 DIAGNOSIS — I10 Essential (primary) hypertension: Secondary | ICD-10-CM | POA: Diagnosis not present

## 2022-12-30 DIAGNOSIS — E1165 Type 2 diabetes mellitus with hyperglycemia: Secondary | ICD-10-CM | POA: Diagnosis not present

## 2022-12-30 DIAGNOSIS — M6281 Muscle weakness (generalized): Secondary | ICD-10-CM | POA: Diagnosis not present

## 2022-12-31 DIAGNOSIS — M6281 Muscle weakness (generalized): Secondary | ICD-10-CM | POA: Diagnosis not present

## 2023-01-01 DIAGNOSIS — M6281 Muscle weakness (generalized): Secondary | ICD-10-CM | POA: Diagnosis not present

## 2023-01-04 DIAGNOSIS — N390 Urinary tract infection, site not specified: Secondary | ICD-10-CM | POA: Diagnosis not present

## 2023-01-04 DIAGNOSIS — Z79899 Other long term (current) drug therapy: Secondary | ICD-10-CM | POA: Diagnosis not present

## 2023-01-04 DIAGNOSIS — M6281 Muscle weakness (generalized): Secondary | ICD-10-CM | POA: Diagnosis not present

## 2023-01-05 DIAGNOSIS — M6281 Muscle weakness (generalized): Secondary | ICD-10-CM | POA: Diagnosis not present

## 2023-01-06 DIAGNOSIS — M6281 Muscle weakness (generalized): Secondary | ICD-10-CM | POA: Diagnosis not present

## 2023-01-07 DIAGNOSIS — M6281 Muscle weakness (generalized): Secondary | ICD-10-CM | POA: Diagnosis not present

## 2023-01-08 DIAGNOSIS — M6281 Muscle weakness (generalized): Secondary | ICD-10-CM | POA: Diagnosis not present

## 2023-01-13 ENCOUNTER — Ambulatory Visit: Payer: Medicare Other | Admitting: Gastroenterology

## 2023-01-13 ENCOUNTER — Encounter: Payer: Self-pay | Admitting: Gastroenterology

## 2023-01-13 VITALS — BP 121/51 | HR 77 | Temp 97.1°F | Ht 62.0 in | Wt 178.0 lb

## 2023-01-13 DIAGNOSIS — R195 Other fecal abnormalities: Secondary | ICD-10-CM

## 2023-01-13 NOTE — Progress Notes (Unsigned)
Gastroenterology Office Note     Primary Care Physician:  Dennard Nip, NP  Primary Gastroenterologist: Dr. Tasia Catchings, seen while inpatient at [facility] Complaint   Chief Complaint  Patient presents with   New Patient (Initial Visit)    Patient here today due to issues with diarrhea.      History of Present Illness   Jamie Strickland is a 77 y.o. female presenting today with a history of DM, HTN, hyperlipidemia, CAD s/p stent in Oct 2023 on aspirin and Plavix, recently inpatient at [facility] with heme positive stool and elevated BUN. GI was consulted during admission but due to no overt GI bleeding, appointment was arranged for outpatient follow-up.  She is a limited historian. Apparently, diarrhea episode had occurred at time of referral to [place], but this has resolved. Currently no diarrhea. Denies abdominal pain, N/V. No overt GI bleeding she is aware. No dysphagia. She is in a wheelchair. Limited mobility.   Hgb 12.9 at discharge. She is unsure if she would like to pursue colonoscopy/EGD; however, she would like for me to discuss with her son, who is POA.   Jamie Strickland (son) [phone]  Last colonoscopy 2016 at [facility] with 2 diminutive polyps No upper endoscopy on file   Past Medical History:  Diagnosis Date   Allergic rhinitis    Diabetes mellitus type II    Hx of colonic polyps    Hyperlipidemia    Hypertension    Mild atherosclerosis of carotid artery, right 11/22/2018   Mild intermittent allergic asthma without complication 12/28/2007   Qualifier: Diagnosis of  By: Alphonsus Sias MD, Ronnette Hila    Mild mitral regurgitation 08/10/2017   Mild nonproliferative diabetic retinopathy (HCC) 09/25/2014   Dxed Dr Blair Promise, 09/11/2014.   Changes noted in bilateral eyes?    Osteoarthritis     Past Surgical History:  Procedure Laterality Date   ABDOMINAL HYSTERECTOMY  1972   Cervical carcinoma in situ, Full, no ovaries/tubes   CATARACT EXTRACTION W/ INTRAOCULAR  LENS  IMPLANT, BILATERAL Bilateral 2014   CATARACT EXTRACTION W/ INTRAOCULAR LENS  IMPLANT, BILATERAL  11/14   CHOLECYSTECTOMY     LASIK      Current Outpatient Medications  Medication Sig Dispense Refill   ACCU-CHEK GUIDE test strip TEST UP TO 4 TIMES DAILY AS DIRECTED 100 strip 3   acetaminophen (TYLENOL) 650 MG CR tablet Take 650 mg by mouth every 4 (four) hours as needed for pain.     albuterol (PROAIR HFA) 108 (90 Base) MCG/ACT inhaler INHALE 1-2 PUFFS INTO THE LUNGS EVERY 4 (FOUR) HOURS AS NEEDED FOR WHEEZING OR SHORTNESS OF BREATH 16 g 0   albuterol (PROVENTIL) (2.5 MG/3ML) 0.083% nebulizer solution USE 1 VIAL VIA NEBULIZER EVERY 4 HOURS AS NEEDED FOR WHEEZING/SHORTNESS OF BREATH 75 mL 5   aluminum-magnesium hydroxide 200-200 MG/5ML suspension Take 30 mLs by mouth every 6 (six) hours as needed for indigestion.     aspirin EC 81 MG tablet Take 81 mg by mouth daily.     atorvastatin (LIPITOR) 40 MG tablet Take 1 tablet (40 mg total) by mouth daily. 30 tablet 1   BD INSULIN SYRINGE U/F 31G X 5/16" 0.5 ML MISC USE AS DIRECTED 100 each 1   BD PEN NEEDLE NANO 2ND GEN 32G X 4 MM MISC FOR WEEKLY INJECTION WITH OZEMPIC 100 each PRN   blood glucose meter kit and supplies Dispense based on patient and insurance preference. Use up to four  times daily as directed. (FOR ICD-10 E10.9, E11.9). 1 each 0   Cholecalciferol 100 MCG (4000 UT) TABS Take 1 tablet by mouth daily.     clopidogrel (PLAVIX) 75 MG tablet Take 1 tablet (75 mg total) by mouth daily. 90 tablet 1   clotrimazole-betamethasone (LOTRISONE) cream Apply 1 Application topically daily.     empagliflozin (JARDIANCE) 10 MG TABS tablet Take 10 mg by mouth daily.     fluticasone (FLONASE) 50 MCG/ACT nasal spray SPRAY 2 SPRAYS INTO EACH NOSTRIL EVERY DAY 48 mL 2   guaifenesin (ROBITUSSIN) 100 MG/5ML syrup Take 200 mg by mouth 3 (three) times daily as needed for cough.     hydrALAZINE (APRESOLINE) 50 MG tablet TAKE 1 TABLET BY MOUTH THREE TIMES A  DAY 270 tablet 0   insulin lispro (HUMALOG) 100 UNIT/ML injection INJECT 25 UNITS INTO THE SKIN 3 TIMES DAILY BEFORE MEALS (Patient taking differently: 25 Units 3 (three) times daily with meals.) 20 mL 0   LANTUS SOLOSTAR 100 UNIT/ML Solostar Pen Inject 30 Units into the skin 2 (two) times daily.     levocetirizine (XYZAL) 5 MG tablet TAKE 1 TABLET BY MOUTH EVERY DAY IN THE EVENING 90 tablet 3   lisinopril-hydrochlorothiazide (ZESTORETIC) 20-12.5 MG tablet Take 1 tablet by mouth 2 (two) times daily.     liver oil-zinc oxide (DESITIN) 40 % ointment Apply topically every 8 (eight) hours as needed for irritation. 56.7 g 0   loperamide (IMODIUM A-D) 2 MG tablet Take 1 tablet (2 mg total) by mouth 4 (four) times daily as needed for diarrhea or loose stools. 30 tablet 0   Menthol-Zinc Oxide (CALMOSEPTINE) 0.44-20.6 % OINT Apply topically. As needed     metFORMIN (GLUCOPHAGE-XR) 500 MG 24 hr tablet TAKE 2 TABLETS (1,000 MG TOTAL) BY MOUTH 2 (TWO) TIMES DAILY WITH A MEAL. 360 tablet 1   metoprolol succinate (TOPROL-XL) 100 MG 24 hr tablet Take 100 mg by mouth daily.     nitrofurantoin, macrocrystal-monohydrate, (MACROBID) 100 MG capsule Take 100 mg by mouth 2 (two) times daily.     nitroGLYCERIN (NITROSTAT) 0.4 MG SL tablet Place 0.4 mg under the tongue every 5 (five) minutes as needed for chest pain.     ondansetron (ZOFRAN-ODT) 4 MG disintegrating tablet Take 4 mg by mouth every 4 (four) hours as needed for nausea or vomiting.     OXYGEN Inhale into the lungs. As needed shortness of breath.     pantoprazole (PROTONIX) 40 MG tablet Take 1 tablet (40 mg total) by mouth 2 (two) times daily.     promethazine (PHENERGAN) 25 MG/ML injection Inject 25 mg into the vein every 6 (six) hours as needed for nausea or vomiting. Per tube if zofran does not work.     sodium phosphate Pediatric (FLEET) 3.5-9.5 GM/59ML enema Place 1 enema rectally once. If constipated     spironolactone (ALDACTONE) 50 MG tablet Take 50 mg  by mouth daily.     sucralfate (CARAFATE) 1 g tablet Take 1 g by mouth 4 (four) times daily -  with meals and at bedtime.     WIXELA INHUB 250-50 MCG/ACT AEPB INHALE 1 PUFF BY MOUTH TWICE A DAY 180 each 2   No current facility-administered medications for this visit.    Allergies as of 01/13/2023 - Review Complete 01/13/2023  Allergen Reaction Noted   Invokana [canagliflozin] Nausea And Vomiting and Other (See Comments) 11/16/2014   Trulicity [dulaglutide]  10/02/2019   Codeine Nausea And Vomiting 06/28/2017  Family History  Problem Relation Age of Onset   Diabetes Mother    Heart disease Mother    Diabetes Father        brittle diabetes   Asthma Father 30   Liver disease Father        liver problems   Heart disease Father    Heart disease Brother        CAD   Cancer Maternal Aunt        Breast   Heart disease Maternal Grandfather        CAD   Cancer Brother        lung    Social History   Socioeconomic History   Marital status: Divorced    Spouse name: Not on file   Number of children: 2   Years of education: Associates Degree   Highest education level: Associate degree: academic program  Occupational History   Occupation: LPN    Comment: on nights at [employer]   Occupation: Retired  Tobacco Use   Smoking status: Former    Current packs/day: 0.50    Average packs/day: 0.5 packs/day for 50.0 years (25.0 ttl pk-yrs)    Types: Cigarettes   Smokeless tobacco: Former    Quit date: 08/09/2008  Vaping Use   Vaping status: Never Used  Substance and Sexual Activity   Alcohol use: Yes    Alcohol/week: 0.0 standard drinks of alcohol    Comment: Rare   Drug use: No   Sexual activity: Not Currently  Other Topics Concern   Not on file  Social History Narrative   No living will   Son, [name], is health care POA   2 sons- Molli Hazard and [name] (74 and 76)    4 grandchildren and 6 great-grandchildren   1 dog lives inside   Right handed    Caffeine- 1 soda-diet, 1 cup  of coffee   Discussed DNR and she wants this. Done 10/17/12   No tube feeds if cognitively unaware   Social Determinants of Health   Financial Resource Strain: Low Risk  (04/14/2022)   Received from [facility], [facility]   Overall Financial Resource Strain (CARDIA)    Difficulty of Paying Living Expenses: Not hard at all  Food Insecurity: No Food Insecurity (03/12/2022)   Received from [facility], [facility]   Hunger Vital Sign    Worried About Running Out of Food in the Last Year: Never true    Ran Out of Food in the Last Year: Never true  Transportation Needs: No Transportation Needs (04/15/2022)   Received from [facility], [facility]   PRAPARE - Transportation    Lack of Transportation (Medical): No    Lack of Transportation (Non-Medical): No  Physical Activity: Sufficiently Active (06/28/2020)   Exercise Vital Sign    Days of Exercise per Week: 6 days    Minutes of Exercise per Session: 30 min  Stress: No Stress Concern Present (04/14/2022)   Received from [facility], [facility] - Occupational Stress Questionnaire    Feeling of Stress : Not at all  Social Connections: Unknown (09/22/2021)   Received from [facility], [facility]   Social Network    Social Network: Not on file  Intimate Partner Violence: Unknown (08/14/2021)   Received from [facility], [facility]   HITS    Physically Hurt: Not on file    Insult or Talk Down To: Not on file  Threaten Physical Harm: Not on file    Scream or Curse: Not on file     Review of Systems   Limited due to cognitive status   Physical Exam   BP (!) 121/51 (BP Location: Left Arm, Patient Position: Sitting, Cuff Size: Large)   Pulse 77   Temp (!) 97.1 F (36.2 C) (Temporal)   Ht 5\' 2"  (1.575 m)   Wt 178 lb (80.7 kg)   BMI 32.56 kg/m  General:   Alert and oriented to person. Frail. Pleasant affect.  Eyes:  Without icterus Abdomen:  +BS, soft,  non-tender and non-distended. Limited exam as in wheelchair.  Rectal:  Deferred  Msk:  Symmetrical without gross deformities. In wheelchair. Extremities:  Without edema. Neurologic:  Alert and  oriented to person and place . Skin:  Intact without significant lesions or rashes. Psych:  Alert and cooperative. Normal mood and affect.   Assessment   ZAINAH FRINK is a 77 y.o. female presenting today  with a history of DM, HTN, hyperlipidemia, CAD s/p stent in Oct 2023 on aspirin and Plavix, recently inpatient at [facility] with heme positive stool and elevated BUN. GI was consulted during admission but due to no overt GI bleeding, appointment was arranged for outpatient follow-up.  Currently denies any diarrhea, although referral information had mentioned this. She has had no overt GI bleeding, and Hgb is stable at baseline. Last colonoscopy in 2016 with 2 diminutive polyps that were hyperplastic, and no known upper endoscopy.  At this time with multiple medical issues and no overt GI bleeding or significant anemia, we will need to discuss with son, Jamie Strickland, who is POA. We will discuss goals of care and if he desires to pursue colonoscopy +/- EGD. Ms Premo relays that she will do what her son thinks is best.        PLAN    Will need to reach out to [facility], Strickland [phone]). I attempted this but was unable to leave message. Will continue to attempt. Consider colonoscopy, possible endoscopy if son and patient are willing May need to favor following clinically and would be appropriate if son does not desire to pursue diagnostic colonoscopy at this time. Further recommendations to follow.    Gelene Mink, PhD, ANP-BC [facility]

## 2023-01-13 NOTE — Patient Instructions (Signed)
I will talk to your son about a colonoscopy.  Further recommendations to follow!  It was a pleasure to see you today. I want to create trusting relationships with patients and provide genuine, compassionate, and quality care. I truly value your feedback, so please be on the lookout for a survey regarding your visit with me today. I appreciate your time in completing this!         Gelene Mink, PhD, ANP-BC [facility]

## 2023-02-03 DIAGNOSIS — J45909 Unspecified asthma, uncomplicated: Secondary | ICD-10-CM | POA: Diagnosis not present

## 2023-02-03 DIAGNOSIS — I10 Essential (primary) hypertension: Secondary | ICD-10-CM | POA: Diagnosis not present

## 2023-02-03 DIAGNOSIS — E785 Hyperlipidemia, unspecified: Secondary | ICD-10-CM | POA: Diagnosis not present

## 2023-02-03 DIAGNOSIS — M1990 Unspecified osteoarthritis, unspecified site: Secondary | ICD-10-CM | POA: Diagnosis not present

## 2023-02-03 DIAGNOSIS — J302 Other seasonal allergic rhinitis: Secondary | ICD-10-CM | POA: Diagnosis not present

## 2023-02-03 DIAGNOSIS — N289 Disorder of kidney and ureter, unspecified: Secondary | ICD-10-CM | POA: Diagnosis not present

## 2023-02-03 DIAGNOSIS — E1165 Type 2 diabetes mellitus with hyperglycemia: Secondary | ICD-10-CM | POA: Diagnosis not present

## 2023-02-03 DIAGNOSIS — E441 Mild protein-calorie malnutrition: Secondary | ICD-10-CM | POA: Diagnosis not present

## 2023-02-09 DIAGNOSIS — B372 Candidiasis of skin and nail: Secondary | ICD-10-CM | POA: Diagnosis not present

## 2023-02-09 DIAGNOSIS — Z79899 Other long term (current) drug therapy: Secondary | ICD-10-CM | POA: Diagnosis not present

## 2023-02-22 DIAGNOSIS — E1165 Type 2 diabetes mellitus with hyperglycemia: Secondary | ICD-10-CM | POA: Diagnosis not present

## 2023-02-22 DIAGNOSIS — E119 Type 2 diabetes mellitus without complications: Secondary | ICD-10-CM | POA: Diagnosis not present

## 2023-02-22 DIAGNOSIS — Z79899 Other long term (current) drug therapy: Secondary | ICD-10-CM | POA: Diagnosis not present

## 2023-02-23 DIAGNOSIS — E860 Dehydration: Secondary | ICD-10-CM | POA: Diagnosis not present

## 2023-02-23 DIAGNOSIS — Z79899 Other long term (current) drug therapy: Secondary | ICD-10-CM | POA: Diagnosis not present

## 2023-02-23 DIAGNOSIS — N289 Disorder of kidney and ureter, unspecified: Secondary | ICD-10-CM | POA: Diagnosis not present

## 2023-02-25 DIAGNOSIS — M6281 Muscle weakness (generalized): Secondary | ICD-10-CM | POA: Diagnosis not present

## 2023-03-02 DIAGNOSIS — Z79899 Other long term (current) drug therapy: Secondary | ICD-10-CM | POA: Diagnosis not present

## 2023-03-24 DIAGNOSIS — I10 Essential (primary) hypertension: Secondary | ICD-10-CM | POA: Diagnosis not present

## 2023-03-24 DIAGNOSIS — J45909 Unspecified asthma, uncomplicated: Secondary | ICD-10-CM | POA: Diagnosis not present

## 2023-03-24 DIAGNOSIS — E785 Hyperlipidemia, unspecified: Secondary | ICD-10-CM | POA: Diagnosis not present

## 2023-03-24 DIAGNOSIS — E1165 Type 2 diabetes mellitus with hyperglycemia: Secondary | ICD-10-CM | POA: Diagnosis not present

## 2023-03-24 DIAGNOSIS — K922 Gastrointestinal hemorrhage, unspecified: Secondary | ICD-10-CM | POA: Diagnosis not present

## 2023-03-24 DIAGNOSIS — M1990 Unspecified osteoarthritis, unspecified site: Secondary | ICD-10-CM | POA: Diagnosis not present

## 2023-03-24 DIAGNOSIS — N289 Disorder of kidney and ureter, unspecified: Secondary | ICD-10-CM | POA: Diagnosis not present

## 2023-04-06 DIAGNOSIS — E1321 Other specified diabetes mellitus with diabetic nephropathy: Secondary | ICD-10-CM | POA: Diagnosis not present

## 2023-04-09 DIAGNOSIS — I10 Essential (primary) hypertension: Secondary | ICD-10-CM | POA: Diagnosis not present

## 2023-04-09 DIAGNOSIS — E785 Hyperlipidemia, unspecified: Secondary | ICD-10-CM | POA: Diagnosis not present

## 2023-04-09 DIAGNOSIS — Z79899 Other long term (current) drug therapy: Secondary | ICD-10-CM | POA: Diagnosis not present

## 2023-04-27 DIAGNOSIS — R059 Cough, unspecified: Secondary | ICD-10-CM | POA: Diagnosis not present

## 2023-04-27 DIAGNOSIS — Z79899 Other long term (current) drug therapy: Secondary | ICD-10-CM | POA: Diagnosis not present

## 2023-04-27 DIAGNOSIS — R058 Other specified cough: Secondary | ICD-10-CM | POA: Diagnosis not present

## 2023-04-27 DIAGNOSIS — R0989 Other specified symptoms and signs involving the circulatory and respiratory systems: Secondary | ICD-10-CM | POA: Diagnosis not present

## 2023-05-03 DIAGNOSIS — Z79899 Other long term (current) drug therapy: Secondary | ICD-10-CM | POA: Diagnosis not present

## 2023-05-03 DIAGNOSIS — K922 Gastrointestinal hemorrhage, unspecified: Secondary | ICD-10-CM | POA: Diagnosis not present

## 2023-05-06 DIAGNOSIS — I10 Essential (primary) hypertension: Secondary | ICD-10-CM | POA: Diagnosis not present

## 2023-05-06 DIAGNOSIS — Z79899 Other long term (current) drug therapy: Secondary | ICD-10-CM | POA: Diagnosis not present

## 2023-05-06 DIAGNOSIS — E785 Hyperlipidemia, unspecified: Secondary | ICD-10-CM | POA: Diagnosis not present

## 2023-05-06 DIAGNOSIS — E1165 Type 2 diabetes mellitus with hyperglycemia: Secondary | ICD-10-CM | POA: Diagnosis not present

## 2023-05-19 DIAGNOSIS — M6281 Muscle weakness (generalized): Secondary | ICD-10-CM | POA: Diagnosis not present

## 2023-05-20 DIAGNOSIS — M6281 Muscle weakness (generalized): Secondary | ICD-10-CM | POA: Diagnosis not present

## 2023-05-21 DIAGNOSIS — M6281 Muscle weakness (generalized): Secondary | ICD-10-CM | POA: Diagnosis not present

## 2023-05-24 DIAGNOSIS — M6281 Muscle weakness (generalized): Secondary | ICD-10-CM | POA: Diagnosis not present

## 2023-05-25 DIAGNOSIS — M6281 Muscle weakness (generalized): Secondary | ICD-10-CM | POA: Diagnosis not present

## 2023-05-26 DIAGNOSIS — M6281 Muscle weakness (generalized): Secondary | ICD-10-CM | POA: Diagnosis not present

## 2023-05-27 DIAGNOSIS — M6281 Muscle weakness (generalized): Secondary | ICD-10-CM | POA: Diagnosis not present

## 2023-05-27 DIAGNOSIS — Z79899 Other long term (current) drug therapy: Secondary | ICD-10-CM | POA: Diagnosis not present

## 2023-05-27 DIAGNOSIS — I2510 Atherosclerotic heart disease of native coronary artery without angina pectoris: Secondary | ICD-10-CM | POA: Diagnosis not present

## 2023-05-27 DIAGNOSIS — I10 Essential (primary) hypertension: Secondary | ICD-10-CM | POA: Diagnosis not present

## 2023-05-27 DIAGNOSIS — E785 Hyperlipidemia, unspecified: Secondary | ICD-10-CM | POA: Diagnosis not present

## 2023-05-27 DIAGNOSIS — K922 Gastrointestinal hemorrhage, unspecified: Secondary | ICD-10-CM | POA: Diagnosis not present

## 2023-05-28 DIAGNOSIS — M6281 Muscle weakness (generalized): Secondary | ICD-10-CM | POA: Diagnosis not present

## 2023-05-30 DIAGNOSIS — M6281 Muscle weakness (generalized): Secondary | ICD-10-CM | POA: Diagnosis not present

## 2023-05-31 DIAGNOSIS — M6281 Muscle weakness (generalized): Secondary | ICD-10-CM | POA: Diagnosis not present

## 2023-06-01 DIAGNOSIS — M6281 Muscle weakness (generalized): Secondary | ICD-10-CM | POA: Diagnosis not present

## 2023-06-02 DIAGNOSIS — M6281 Muscle weakness (generalized): Secondary | ICD-10-CM | POA: Diagnosis not present

## 2023-06-03 DIAGNOSIS — M6281 Muscle weakness (generalized): Secondary | ICD-10-CM | POA: Diagnosis not present

## 2023-06-07 DIAGNOSIS — M6281 Muscle weakness (generalized): Secondary | ICD-10-CM | POA: Diagnosis not present

## 2023-06-08 DIAGNOSIS — M6281 Muscle weakness (generalized): Secondary | ICD-10-CM | POA: Diagnosis not present

## 2023-06-09 DIAGNOSIS — M6281 Muscle weakness (generalized): Secondary | ICD-10-CM | POA: Diagnosis not present

## 2023-06-16 DIAGNOSIS — E1165 Type 2 diabetes mellitus with hyperglycemia: Secondary | ICD-10-CM | POA: Diagnosis not present

## 2023-06-16 DIAGNOSIS — E785 Hyperlipidemia, unspecified: Secondary | ICD-10-CM | POA: Diagnosis not present

## 2023-06-16 DIAGNOSIS — N289 Disorder of kidney and ureter, unspecified: Secondary | ICD-10-CM | POA: Diagnosis not present

## 2023-06-16 DIAGNOSIS — I2510 Atherosclerotic heart disease of native coronary artery without angina pectoris: Secondary | ICD-10-CM | POA: Diagnosis not present

## 2023-06-16 DIAGNOSIS — J45909 Unspecified asthma, uncomplicated: Secondary | ICD-10-CM | POA: Diagnosis not present

## 2023-06-16 DIAGNOSIS — M1990 Unspecified osteoarthritis, unspecified site: Secondary | ICD-10-CM | POA: Diagnosis not present

## 2023-06-16 DIAGNOSIS — I10 Essential (primary) hypertension: Secondary | ICD-10-CM | POA: Diagnosis not present

## 2023-06-16 DIAGNOSIS — K922 Gastrointestinal hemorrhage, unspecified: Secondary | ICD-10-CM | POA: Diagnosis not present

## 2023-07-07 DIAGNOSIS — Z961 Presence of intraocular lens: Secondary | ICD-10-CM | POA: Diagnosis not present

## 2023-07-07 DIAGNOSIS — E113293 Type 2 diabetes mellitus with mild nonproliferative diabetic retinopathy without macular edema, bilateral: Secondary | ICD-10-CM | POA: Diagnosis not present

## 2023-07-07 DIAGNOSIS — H524 Presbyopia: Secondary | ICD-10-CM | POA: Diagnosis not present

## 2023-07-07 DIAGNOSIS — H35033 Hypertensive retinopathy, bilateral: Secondary | ICD-10-CM | POA: Diagnosis not present

## 2023-07-08 DIAGNOSIS — E1122 Type 2 diabetes mellitus with diabetic chronic kidney disease: Secondary | ICD-10-CM | POA: Diagnosis not present

## 2023-07-26 DIAGNOSIS — E785 Hyperlipidemia, unspecified: Secondary | ICD-10-CM | POA: Diagnosis not present

## 2023-07-26 DIAGNOSIS — I2510 Atherosclerotic heart disease of native coronary artery without angina pectoris: Secondary | ICD-10-CM | POA: Diagnosis not present

## 2023-07-26 DIAGNOSIS — Z79899 Other long term (current) drug therapy: Secondary | ICD-10-CM | POA: Diagnosis not present

## 2023-07-26 DIAGNOSIS — I10 Essential (primary) hypertension: Secondary | ICD-10-CM | POA: Diagnosis not present

## 2023-07-26 DIAGNOSIS — E1165 Type 2 diabetes mellitus with hyperglycemia: Secondary | ICD-10-CM | POA: Diagnosis not present

## 2023-08-04 DIAGNOSIS — M6281 Muscle weakness (generalized): Secondary | ICD-10-CM | POA: Diagnosis not present

## 2023-08-05 DIAGNOSIS — M6281 Muscle weakness (generalized): Secondary | ICD-10-CM | POA: Diagnosis not present

## 2023-08-06 DIAGNOSIS — M6281 Muscle weakness (generalized): Secondary | ICD-10-CM | POA: Diagnosis not present

## 2023-08-08 DIAGNOSIS — M6281 Muscle weakness (generalized): Secondary | ICD-10-CM | POA: Diagnosis not present

## 2023-08-09 DIAGNOSIS — M6281 Muscle weakness (generalized): Secondary | ICD-10-CM | POA: Diagnosis not present

## 2023-08-10 DIAGNOSIS — M6281 Muscle weakness (generalized): Secondary | ICD-10-CM | POA: Diagnosis not present

## 2023-08-11 DIAGNOSIS — M6281 Muscle weakness (generalized): Secondary | ICD-10-CM | POA: Diagnosis not present

## 2023-08-11 DIAGNOSIS — L603 Nail dystrophy: Secondary | ICD-10-CM | POA: Diagnosis not present

## 2023-08-11 DIAGNOSIS — E1151 Type 2 diabetes mellitus with diabetic peripheral angiopathy without gangrene: Secondary | ICD-10-CM | POA: Diagnosis not present

## 2023-08-11 DIAGNOSIS — L602 Onychogryphosis: Secondary | ICD-10-CM | POA: Diagnosis not present

## 2023-08-11 DIAGNOSIS — Z794 Long term (current) use of insulin: Secondary | ICD-10-CM | POA: Diagnosis not present

## 2023-08-11 DIAGNOSIS — L84 Corns and callosities: Secondary | ICD-10-CM | POA: Diagnosis not present

## 2023-08-12 DIAGNOSIS — M6281 Muscle weakness (generalized): Secondary | ICD-10-CM | POA: Diagnosis not present

## 2023-08-16 DIAGNOSIS — M6281 Muscle weakness (generalized): Secondary | ICD-10-CM | POA: Diagnosis not present

## 2023-08-17 DIAGNOSIS — M6281 Muscle weakness (generalized): Secondary | ICD-10-CM | POA: Diagnosis not present

## 2023-09-01 DIAGNOSIS — Z79899 Other long term (current) drug therapy: Secondary | ICD-10-CM | POA: Diagnosis not present

## 2023-09-01 DIAGNOSIS — D649 Anemia, unspecified: Secondary | ICD-10-CM | POA: Diagnosis not present

## 2023-09-06 DIAGNOSIS — E113293 Type 2 diabetes mellitus with mild nonproliferative diabetic retinopathy without macular edema, bilateral: Secondary | ICD-10-CM | POA: Diagnosis not present

## 2023-09-08 DIAGNOSIS — I2510 Atherosclerotic heart disease of native coronary artery without angina pectoris: Secondary | ICD-10-CM | POA: Diagnosis not present

## 2023-09-08 DIAGNOSIS — E1165 Type 2 diabetes mellitus with hyperglycemia: Secondary | ICD-10-CM | POA: Diagnosis not present

## 2023-09-08 DIAGNOSIS — I10 Essential (primary) hypertension: Secondary | ICD-10-CM | POA: Diagnosis not present

## 2023-09-08 DIAGNOSIS — M1990 Unspecified osteoarthritis, unspecified site: Secondary | ICD-10-CM | POA: Diagnosis not present

## 2023-09-08 DIAGNOSIS — J45909 Unspecified asthma, uncomplicated: Secondary | ICD-10-CM | POA: Diagnosis not present

## 2023-09-08 DIAGNOSIS — E785 Hyperlipidemia, unspecified: Secondary | ICD-10-CM | POA: Diagnosis not present

## 2023-09-21 DIAGNOSIS — E119 Type 2 diabetes mellitus without complications: Secondary | ICD-10-CM | POA: Diagnosis not present

## 2023-09-23 DIAGNOSIS — Z23 Encounter for immunization: Secondary | ICD-10-CM | POA: Diagnosis not present

## 2023-10-07 DIAGNOSIS — E785 Hyperlipidemia, unspecified: Secondary | ICD-10-CM | POA: Diagnosis not present

## 2023-10-07 DIAGNOSIS — E1165 Type 2 diabetes mellitus with hyperglycemia: Secondary | ICD-10-CM | POA: Diagnosis not present

## 2023-10-07 DIAGNOSIS — I10 Essential (primary) hypertension: Secondary | ICD-10-CM | POA: Diagnosis not present

## 2023-10-07 DIAGNOSIS — Z7189 Other specified counseling: Secondary | ICD-10-CM | POA: Diagnosis not present

## 2023-10-07 DIAGNOSIS — Z79899 Other long term (current) drug therapy: Secondary | ICD-10-CM | POA: Diagnosis not present

## 2023-10-15 DIAGNOSIS — M6281 Muscle weakness (generalized): Secondary | ICD-10-CM | POA: Diagnosis not present

## 2023-10-21 DIAGNOSIS — E1165 Type 2 diabetes mellitus with hyperglycemia: Secondary | ICD-10-CM | POA: Diagnosis not present

## 2023-10-21 DIAGNOSIS — Z79899 Other long term (current) drug therapy: Secondary | ICD-10-CM | POA: Diagnosis not present

## 2023-10-21 DIAGNOSIS — I10 Essential (primary) hypertension: Secondary | ICD-10-CM | POA: Diagnosis not present

## 2023-10-21 DIAGNOSIS — E785 Hyperlipidemia, unspecified: Secondary | ICD-10-CM | POA: Diagnosis not present

## 2023-11-03 DIAGNOSIS — M6281 Muscle weakness (generalized): Secondary | ICD-10-CM | POA: Diagnosis not present

## 2023-11-04 DIAGNOSIS — M6281 Muscle weakness (generalized): Secondary | ICD-10-CM | POA: Diagnosis not present

## 2023-11-05 DIAGNOSIS — M6281 Muscle weakness (generalized): Secondary | ICD-10-CM | POA: Diagnosis not present

## 2023-11-08 DIAGNOSIS — M6281 Muscle weakness (generalized): Secondary | ICD-10-CM | POA: Diagnosis not present

## 2023-11-09 DIAGNOSIS — M6281 Muscle weakness (generalized): Secondary | ICD-10-CM | POA: Diagnosis not present

## 2023-11-10 DIAGNOSIS — M6281 Muscle weakness (generalized): Secondary | ICD-10-CM | POA: Diagnosis not present

## 2023-11-11 DIAGNOSIS — M6281 Muscle weakness (generalized): Secondary | ICD-10-CM | POA: Diagnosis not present

## 2023-11-12 DIAGNOSIS — M6281 Muscle weakness (generalized): Secondary | ICD-10-CM | POA: Diagnosis not present

## 2023-11-15 DIAGNOSIS — M6281 Muscle weakness (generalized): Secondary | ICD-10-CM | POA: Diagnosis not present

## 2023-11-16 DIAGNOSIS — M6281 Muscle weakness (generalized): Secondary | ICD-10-CM | POA: Diagnosis not present

## 2023-11-30 DIAGNOSIS — E782 Mixed hyperlipidemia: Secondary | ICD-10-CM | POA: Diagnosis not present

## 2023-11-30 DIAGNOSIS — E119 Type 2 diabetes mellitus without complications: Secondary | ICD-10-CM | POA: Diagnosis not present

## 2023-12-01 DIAGNOSIS — E785 Hyperlipidemia, unspecified: Secondary | ICD-10-CM | POA: Diagnosis not present

## 2023-12-01 DIAGNOSIS — E1165 Type 2 diabetes mellitus with hyperglycemia: Secondary | ICD-10-CM | POA: Diagnosis not present

## 2023-12-01 DIAGNOSIS — M1990 Unspecified osteoarthritis, unspecified site: Secondary | ICD-10-CM | POA: Diagnosis not present

## 2023-12-01 DIAGNOSIS — Z79899 Other long term (current) drug therapy: Secondary | ICD-10-CM | POA: Diagnosis not present

## 2023-12-01 DIAGNOSIS — I10 Essential (primary) hypertension: Secondary | ICD-10-CM | POA: Diagnosis not present

## 2023-12-01 DIAGNOSIS — I2510 Atherosclerotic heart disease of native coronary artery without angina pectoris: Secondary | ICD-10-CM | POA: Diagnosis not present

## 2023-12-02 DIAGNOSIS — J45909 Unspecified asthma, uncomplicated: Secondary | ICD-10-CM | POA: Diagnosis not present

## 2023-12-02 DIAGNOSIS — Z79899 Other long term (current) drug therapy: Secondary | ICD-10-CM | POA: Diagnosis not present

## 2023-12-13 DIAGNOSIS — E1151 Type 2 diabetes mellitus with diabetic peripheral angiopathy without gangrene: Secondary | ICD-10-CM | POA: Diagnosis not present

## 2023-12-13 DIAGNOSIS — L602 Onychogryphosis: Secondary | ICD-10-CM | POA: Diagnosis not present

## 2023-12-13 DIAGNOSIS — I739 Peripheral vascular disease, unspecified: Secondary | ICD-10-CM | POA: Diagnosis not present

## 2023-12-21 DIAGNOSIS — I10 Essential (primary) hypertension: Secondary | ICD-10-CM | POA: Diagnosis not present

## 2023-12-21 DIAGNOSIS — J45909 Unspecified asthma, uncomplicated: Secondary | ICD-10-CM | POA: Diagnosis not present

## 2023-12-21 DIAGNOSIS — Z79899 Other long term (current) drug therapy: Secondary | ICD-10-CM | POA: Diagnosis not present

## 2023-12-21 DIAGNOSIS — E785 Hyperlipidemia, unspecified: Secondary | ICD-10-CM | POA: Diagnosis not present

## 2024-01-07 DIAGNOSIS — E119 Type 2 diabetes mellitus without complications: Secondary | ICD-10-CM | POA: Diagnosis not present

## 2024-01-18 DIAGNOSIS — J45909 Unspecified asthma, uncomplicated: Secondary | ICD-10-CM | POA: Diagnosis not present

## 2024-01-18 DIAGNOSIS — I10 Essential (primary) hypertension: Secondary | ICD-10-CM | POA: Diagnosis not present

## 2024-01-18 DIAGNOSIS — Z79899 Other long term (current) drug therapy: Secondary | ICD-10-CM | POA: Diagnosis not present

## 2024-01-18 DIAGNOSIS — E1165 Type 2 diabetes mellitus with hyperglycemia: Secondary | ICD-10-CM | POA: Diagnosis not present

## 2024-01-24 ENCOUNTER — Telehealth: Payer: Self-pay | Admitting: Gastroenterology

## 2024-01-24 DIAGNOSIS — E113393 Type 2 diabetes mellitus with moderate nonproliferative diabetic retinopathy without macular edema, bilateral: Secondary | ICD-10-CM | POA: Diagnosis not present

## 2024-01-24 DIAGNOSIS — Z961 Presence of intraocular lens: Secondary | ICD-10-CM | POA: Diagnosis not present

## 2024-01-24 DIAGNOSIS — H35033 Hypertensive retinopathy, bilateral: Secondary | ICD-10-CM | POA: Diagnosis not present

## 2024-01-24 NOTE — Telephone Encounter (Signed)
 Ladonna: can we have patient return for routine follow-up to see me? Hx of Heme positive stool and lost to follow-up. Can discuss goals of care.

## 2024-03-07 LAB — LAB REPORT - SCANNED: EGFR: 46

## 2024-03-16 ENCOUNTER — Encounter: Payer: Self-pay | Admitting: Gastroenterology

## 2024-03-16 ENCOUNTER — Ambulatory Visit: Admitting: Gastroenterology

## 2024-03-16 VITALS — BP 138/75 | HR 67 | Temp 97.1°F | Ht 60.0 in | Wt 192.0 lb

## 2024-03-16 DIAGNOSIS — R195 Other fecal abnormalities: Secondary | ICD-10-CM | POA: Diagnosis not present

## 2024-03-16 NOTE — Progress Notes (Signed)
 Gastroenterology Office Note     Primary Care Physician:  Joesph Roz CROME, NP  Primary Gastroenterologist: Dr Cinderella   Chief Complaint   Chief Complaint  Patient presents with   Follow-up     History of Present Illness   Jamie Strickland is a 78 y.o. female who resides at [facility] facility, presenting today with a history of  DM, HTN, hyperlipidemia, CAD s/p stent in Oct 2023 on aspirin  and Plavix , inpatient at [facility] last year with heme positive stool and elevated BUN. GI was consulted during admission but due to no overt GI bleeding, appointment was arranged for outpatient follow-up. She was seen last year but was limited historian. I attempted to reach son to discuss goals of care but was unable to reach. She is here today for routine follow-up and to discuss any need for procedures.   She is on Plavix  and 81 mg aspirin . She has no GI concerns today. Limited historian. Denies any obvious overt bleeding. Has good appetite. No problems swallowing. No issues with constipation or diarrhea that she is aware.   I reached out to son and left message regarding her prior history and unable to reach. Left message again today.   Last colonoscopy 2016 at [facility] with 2 diminutive polyps No upper endoscopy on file  Past Medical History:  Diagnosis Date   Allergic rhinitis    Diabetes mellitus type II    Hx of colonic polyps    Hyperlipidemia    Hypertension    Mild atherosclerosis of carotid artery, right 11/22/2018   Mild intermittent allergic asthma without complication 12/28/2007   Qualifier: Diagnosis of  By: Jimmy MD, [name] Scarlet    Mild mitral regurgitation 08/10/2017   Mild nonproliferative diabetic retinopathy (HCC) 09/25/2014   Dxed Dr Marylen Grise, 09/11/2014.   Changes noted in bilateral eyes?    Osteoarthritis     Past Surgical History:  Procedure Laterality Date   ABDOMINAL HYSTERECTOMY  1972   Cervical carcinoma in situ, Full, no ovaries/tubes   CATARACT  EXTRACTION W/ INTRAOCULAR LENS  IMPLANT, BILATERAL Bilateral 2014   CATARACT EXTRACTION W/ INTRAOCULAR LENS  IMPLANT, BILATERAL  11/14   CHOLECYSTECTOMY     LASIK      Current Outpatient Medications  Medication Sig Dispense Refill   ACCU-CHEK GUIDE test strip TEST UP TO 4 TIMES DAILY AS DIRECTED 100 strip 3   acetaminophen  (TYLENOL ) 650 MG CR tablet Take 650 mg by mouth every 4 (four) hours as needed for pain.     albuterol  (PROAIR  HFA) 108 (90 Base) MCG/ACT inhaler INHALE 1-2 PUFFS INTO THE LUNGS EVERY 4 (FOUR) HOURS AS NEEDED FOR WHEEZING OR SHORTNESS OF BREATH 16 g 0   albuterol  (PROVENTIL ) (2.5 MG/3ML) 0.083% nebulizer solution USE 1 VIAL VIA NEBULIZER EVERY 4 HOURS AS NEEDED FOR WHEEZING/SHORTNESS OF BREATH 75 mL 5   aspirin  EC 81 MG tablet Take 81 mg by mouth daily.     atorvastatin  (LIPITOR) 40 MG tablet Take 1 tablet (40 mg total) by mouth daily. 30 tablet 1   BD INSULIN  SYRINGE U/F 31G X 5/16 0.5 ML MISC USE AS DIRECTED 100 each 1   blood glucose meter kit and supplies Dispense based on patient and insurance preference. Use up to four times daily as directed. (FOR ICD-10 E10.9, E11.9). 1 each 0   Cholecalciferol 100 MCG (4000 UT) TABS Take 1 tablet by mouth daily.     clopidogrel  (PLAVIX ) 75 MG tablet Take 1 tablet (75  mg total) by mouth daily. 90 tablet 1   clotrimazole-betamethasone (LOTRISONE) cream Apply 1 Application topically daily.     EMBECTA AUTOSHIELD DUO 30G X 5 MM MISC      fluticasone  (FLONASE ) 50 MCG/ACT nasal spray SPRAY 2 SPRAYS INTO EACH NOSTRIL EVERY DAY 48 mL 2   hydrALAZINE  (APRESOLINE ) 50 MG tablet TAKE 1 TABLET BY MOUTH THREE TIMES A DAY 270 tablet 0   insulin  lispro (HUMALOG ) 100 UNIT/ML KwikPen Inject into the skin.     JARDIANCE 25 MG TABS tablet Take 25 mg by mouth daily.     LANTUS  100 UNIT/ML injection Inject 20 Units into the skin 2 (two) times daily.     levocetirizine (XYZAL ) 5 MG tablet TAKE 1 TABLET BY MOUTH EVERY DAY IN THE EVENING 90 tablet 3    lisinopril -hydrochlorothiazide  (ZESTORETIC ) 20-12.5 MG tablet Take 1 tablet by mouth 2 (two) times daily.     loperamide  (IMODIUM  A-D) 2 MG tablet Take 1 tablet (2 mg total) by mouth 4 (four) times daily as needed for diarrhea or loose stools. 30 tablet 0   Menthol-Zinc  Oxide (CALMOSEPTINE) 0.44-20.6 % OINT Apply topically. As needed     metFORMIN  (GLUCOPHAGE ) 500 MG tablet Take 1,000 mg by mouth 2 (two) times daily.     metoprolol succinate (TOPROL-XL) 100 MG 24 hr tablet Take 100 mg by mouth daily.     nitroGLYCERIN (NITROSTAT) 0.4 MG SL tablet Place 0.4 mg under the tongue every 5 (five) minutes as needed for chest pain.     ondansetron  (ZOFRAN -ODT) 4 MG disintegrating tablet Take 4 mg by mouth every 4 (four) hours as needed for nausea or vomiting.     pantoprazole  (PROTONIX ) 40 MG tablet Take 1 tablet (40 mg total) by mouth 2 (two) times daily.     spironolactone (ALDACTONE) 50 MG tablet Take 50 mg by mouth daily.     WIXELA INHUB 250-50 MCG/ACT AEPB INHALE 1 PUFF BY MOUTH TWICE A DAY 180 each 2   No current facility-administered medications for this visit.    Allergies as of 03/16/2024 - Review Complete 03/16/2024  Allergen Reaction Noted   Cat dander Other (See Comments) 02/16/2022   Invokana  [canagliflozin ] Nausea And Vomiting and Other (See Comments) 11/16/2014   Other Other (See Comments) 02/16/2022   Trulicity  [dulaglutide ]  10/02/2019   Codeine Nausea And Vomiting 06/28/2017    Family History  Problem Relation Age of Onset   Diabetes Mother    Heart disease Mother    Diabetes Father        brittle diabetes   Asthma Father 90   Liver disease Father        liver problems   Heart disease Father    Heart disease Brother        CAD   Cancer Brother        lung   Heart disease Maternal Grandfather        CAD   Cancer Maternal Aunt        Breast   Colon cancer Neg Hx     Social History   Socioeconomic History   Marital status: Divorced    Spouse name: Not on file    Number of children: 2   Years of education: Associates Degree   Highest education level: Associate degree: academic program  Occupational History   Occupation: LPN    Comment: on nights at [employer]   Occupation: Retired  Tobacco Use   Smoking status: Former  Current packs/day: 0.50    Average packs/day: 0.5 packs/day for 50.0 years (25.0 ttl pk-yrs)    Types: Cigarettes   Smokeless tobacco: Former    Quit date: 08/09/2008  Vaping Use   Vaping status: Never Used  Substance and Sexual Activity   Alcohol use: Yes    Alcohol/week: 0.0 standard drinks of alcohol    Comment: Rare   Drug use: No   Sexual activity: Not Currently  Other Topics Concern   Not on file  Social History Narrative   No living will   Son, Jamie Strickland, is health care POA   2 sons- Jamie Strickland (35 and 47)    4 grandchildren and 6 great-grandchildren   1 dog lives inside   Right handed    Caffeine- 1 soda-diet, 1 cup of coffee   Discussed DNR and she wants this. Done 10/17/12   No tube feeds if cognitively unaware   Social Drivers of Health   Financial Resource Strain: Low Risk  (04/14/2022)   Received from [facility]   Overall Financial Resource Strain (CARDIA)    Difficulty of Paying Living Expenses: Not hard at all  Food Insecurity: No Food Insecurity (03/12/2022)   Received from [facility]   Hunger Vital Sign    Within the past 12 months, you worried that your food would run out before you got the money to buy more.: Never true    Within the past 12 months, the food you bought just didn't last and you didn't have money to get more.: Never true  Transportation Needs: No Transportation Needs (04/15/2022)   Received from [facility] - Transportation    Lack of Transportation (Medical): No    Lack of Transportation (Non-Medical): No  Physical Activity: Sufficiently Active (06/28/2020)   Exercise Vital Sign    Days of Exercise per Week: 6 days    Minutes of Exercise per Session: 30  min  Stress: No Stress Concern Present (04/14/2022)   Received from [facility] - Occupational Stress Questionnaire    Feeling of Stress : Not at all  Social Connections: Unknown (09/22/2021)   Received from [facility]   Social Network    Social Network: Not on file  Intimate Partner Violence: Unknown (08/14/2021)   Received from [facility]   HITS    Physically Hurt: Not on file    Insult or Talk Down To: Not on file    Threaten Physical Harm: Not on file    Scream or Curse: Not on file     Review of Systems   Limited due to cognitive status   Physical Exam   BP 138/75 (BP Location: Right Arm, Patient Position: Sitting, Cuff Size: Large)   Pulse 67   Temp (!) 97.1 F (36.2 C) (Temporal)   Ht 5' (1.524 m)   Wt 192 lb (87.1 kg)   SpO2 98%   BMI 37.50 kg/m  General:   Alert and oriented to person.  Pleasant and cooperative. Well-nourished and well-developed.  Abdomen:  +BS, soft, non-tender and non-distended. No HSM noted. No guarding or rebound. No masses appreciated. Limited as patient in wheelchair.  Psych:  Alert and cooperative. Normal mood and affect.   Assessment/Plan   TONICA BRASINGTON is a 78 y.o. female who resides at [facility] facility, presenting today with a history of  DM, HTN, hyperlipidemia, CAD s/p stent in Oct 2023 on aspirin  and Plavix , inpatient at [facility]  last year with heme positive stool and elevated BUN. GI was consulted during admission but due to no overt GI bleeding, appointment was arranged for outpatient follow-up. She was seen last year but was limited historian. I attempted to reach son to discuss goals of care but was unable to reach. She is here today for routine follow-up and to discuss any need for procedures.   Remains on Plavix  and aspirin . No overt GI bleeding. I again attempted to reach son. Unable to reach and had to leave message.  We are requesting any recent labs.  In light of  multimorbidities, would recommend following conservatively and only pursuing endoscopic procedures if significant bleeding or for therapeutic reasons.     Therisa MICAEL Stager, PhD, ANP-BC [facility]

## 2024-03-16 NOTE — Patient Instructions (Signed)
 You have a history of blood in your stool in the past (blood we can't see but was seen on a sample in the lab). As you are doing well without any overt bleeding such as black stool, bright red blood, weight loss, concerning signs or symptoms, I recommend we hold off on any procedures and see you back as needed.   I have reached out to your son as well but had to leave a message.  If at any point there are any concerns, [place] can reach back out to us , and we will see you!  Otherwise, we will see you here if needed!    I enjoyed seeing you again today! I value our relationship and want to provide genuine, compassionate, and quality care. You may receive a survey regarding your visit with me, and I welcome your feedback! Thanks so much for taking the time to complete this. I look forward to seeing you again.      Therisa MICAEL Stager, PhD, ANP-BC [facility]

## 2024-04-20 ENCOUNTER — Ambulatory Visit: Payer: Self-pay | Admitting: Gastroenterology
